# Patient Record
Sex: Male | Born: 1940
Health system: Southern US, Community
[De-identification: ages and names within clinical notes are randomized; demographics above are authoritative.]

## PROBLEM LIST (undated history)

## (undated) DIAGNOSIS — E119 Type 2 diabetes mellitus without complications: Secondary | ICD-10-CM

## (undated) DIAGNOSIS — M1990 Unspecified osteoarthritis, unspecified site: Secondary | ICD-10-CM

## (undated) DIAGNOSIS — N289 Disorder of kidney and ureter, unspecified: Secondary | ICD-10-CM

## (undated) DIAGNOSIS — I639 Cerebral infarction, unspecified: Secondary | ICD-10-CM

## (undated) DIAGNOSIS — I10 Essential (primary) hypertension: Secondary | ICD-10-CM

## (undated) HISTORY — DX: Cerebral infarction, unspecified: I63.9

## (undated) HISTORY — DX: Unspecified osteoarthritis, unspecified site: M19.90

## (undated) HISTORY — PX: NASAL SINUS SURGERY: SHX719

## (undated) HISTORY — DX: Essential (primary) hypertension: I10

## (undated) HISTORY — DX: Type 2 diabetes mellitus without complications: E11.9

## (undated) HISTORY — PX: TOTAL HIP ARTHROPLASTY: SHX124

## (undated) HISTORY — DX: Disorder of kidney and ureter, unspecified: N28.9

---

## 2003-09-07 ENCOUNTER — Encounter: Payer: Self-pay | Admitting: Orthopedic Surgery

## 2003-09-11 ENCOUNTER — Inpatient Hospital Stay (HOSPITAL_COMMUNITY): Admission: RE | Admit: 2003-09-11 | Discharge: 2003-09-15 | Payer: Self-pay | Admitting: Orthopedic Surgery

## 2003-09-11 ENCOUNTER — Encounter: Payer: Self-pay | Admitting: Orthopedic Surgery

## 2013-11-10 ENCOUNTER — Ambulatory Visit (INDEPENDENT_AMBULATORY_CARE_PROVIDER_SITE_OTHER): Payer: Medicare Other | Admitting: Internal Medicine

## 2013-11-10 ENCOUNTER — Encounter: Payer: Self-pay | Admitting: Internal Medicine

## 2013-11-10 VITALS — BP 140/80 | HR 89 | Ht 63.0 in | Wt 168.4 lb

## 2013-11-10 DIAGNOSIS — R05 Cough: Secondary | ICD-10-CM | POA: Insufficient documentation

## 2013-11-10 NOTE — Assessment & Plan Note (Signed)
Am concerned you might or might not have scarring disease in lung to explain your symptoms  - Have CT scan chest wo contrast at [facility]  . High Resolution CT chest without contrast on ILD protocol. Only  Dr Leanna Battles or Dr. Trudie Reed to read  - Have PFT any location in [place]  - Have overnight oxygen study  #FOllowup   - return to see me or my NP after the above  - If CT shows definite scarring we need to do autoimmune blood work but we can decide this at followup

## 2013-11-10 NOTE — Patient Instructions (Signed)
Am concerned you might or might not have scarring disease in lung to explain your symptoms  - Have CT scan chest wo contrast at [facility]  . High Resolution CT chest without contrast on ILD protocol. Only  Dr Leanna Battles or Dr. Trudie Reed to read  - Have PFT any location in [place]  - Have overnight oxygen study  #FOllowup   - return to see me or my NP after the above  - If CT shows definite scarring we need to do some blood work but we can decide this at followup

## 2013-11-10 NOTE — Progress Notes (Signed)
Subjective:    Patient ID: Matthew Harrell, male    DOB: 12/18/1941, 72 y.o.   MRN: [id number] PCP TAPPER,DAVID B, MD    HPI  Referred for chronic cough  72 year old obese male reports insidious onset of tonic cough for the last 2 years. At onset he recollects a sinus infection but after that he's had a dry cough. A year ago due to nasal polyps had "extensive" sinus surgery in [place], [place] but this did not help his cough. He thinks the cough is progressive. Moderate in severity. Worse in the nighttime and when he lies down. Improved in the daytime. No other clear cut aggravating or relieving factors. There is associated ticklish sensation in the throat, postnasal drainage, acid reflux which he thinks is well controlled. He also periodically clears his throat  Cough is associated with dyspnea for the last 1 year. Insidious onset. Exertional.. Relieved with rest. Mild in intensity. No associated chest pain. Cough and dyspnea associated with crackles on chest exam  Walking desaturation test today 108 5 feet x3 laps: Lowest pulse ox was 92%. Peak heart rate was 117 per minute   Cough related Hx - BP  - on ARB for a year   - Sinus  / s/p sinus surgery and removal of polyps a year ago in [place], [place] but no relief in cough  Current Rx with zyrtec and singulair  - GI  /hx positive for GERD; on omprazole  - Exposure hx  -   reports that he quit smoking about 30 years ago. His smoking use included Cigarettes. He has a 10 pack-year smoking history. He does not have any smokeless tobacco history on file.   - Worked in the [employer]. 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  TWO VIEW CHEST - 09/07/03  COMPARISON: NONE.  THE CARDIOMEDIASTINAL SILHOUETTE IS UNREMARKABLE FOR AGE. THERE IS LINEAR SCAR OR ATELECTASIS IN  THE LEFT LOWER LOBE. THE LUNGS APPEAR CLEAR OTHERWISE. THERE ARE DEGENERATIVE CHANGES THROUGHOUT  THE THORACIC SPINE, AND THERE ARE WEDGE COMPRESSION DEFORMITIES OF T11 AND T12.  IMPRESSION  LINEAR ATELECTASIS VS. SCAR IN THE LEFT LOWER LOBE. NO EVIDENCE OF ACUTE DISEASE OTHERWISE. WEDGE  COMPRESSION DEFORMITIES OF T11 AND T12.   Past Medical History  Diagnosis Date  . Diabetes   . Arthritis   . Hypertension      Family History  Problem Relation Age of Onset  . Diabetes Brother   . Stomach cancer Sister   . Aneurysm Mother 67  . Heart attack Father 73     History   Social History  . Marital Status: Divorced    Spouse Name: N/A    Number of Children: N/A  . Years of Education: N/A   Occupational History  . Not on file.   Social History Main Topics  . Smoking status: Former Smoker -- 1.00 packs/day for 10 years    Types: Cigarettes    Quit date: 12/29/1982  . Smokeless tobacco: Not on file  . Alcohol Use: No  . Drug Use: No  . Sexual Activity: Not on file   Other Topics Concern  . Not on file  Social History Narrative  . No narrative on file     No Known Allergies   No outpatient prescriptions prior to visit.   No facility-administered medications prior to visit.       Review of Systems  Constitutional: Negative for fever and unexpected weight change.  HENT: Negative for congestion, dental problem, ear pain, nosebleeds, postnasal drip, rhinorrhea, sinus pressure, sneezing, sore throat and trouble swallowing.   Eyes: Negative for redness and itching.  Respiratory: Positive for cough, chest tightness, shortness of breath and wheezing.   Cardiovascular: Negative for palpitations and leg swelling.  Gastrointestinal: Negative for nausea and vomiting.  Genitourinary: Negative  for dysuria.  Musculoskeletal: Negative for joint swelling.  Skin: Negative for rash.  Neurological: Negative for headaches.  Hematological: Does not bruise/bleed easily.  Psychiatric/Behavioral: Negative for dysphoric mood. The patient is not nervous/anxious.        Objective:   Physical Exam  Nursing note and vitals reviewed. Constitutional: He is oriented to person, place, and time. He appears well-developed and well-nourished. No distress.  Short and obese  HENT:  Head: Normocephalic and atraumatic.  Right Ear: External ear normal.  Left Ear: External ear normal.  Mouth/Throat: Oropharynx is clear and moist. No oropharyngeal exudate.  Postnasal drainage present  Eyes: Conjunctivae and EOM are normal. Pupils are equal, round, and reactive to light. Right eye exhibits no discharge. Left eye exhibits no discharge. No scleral icterus.  Neck: Normal range of motion. Neck supple. No JVD present. No tracheal deviation present. No thyromegaly present.  Cardiovascular: Normal rate, regular rhythm and intact distal pulses.  Exam reveals no gallop and no friction rub.   No murmur heard. Pulmonary/Chest: Effort normal. No respiratory distress. He has no wheezes. He has rales. He exhibits no tenderness.  Abdominal: Soft. Bowel sounds are normal. He exhibits no distension and no mass. There is no tenderness. There is no rebound and no guarding.  Musculoskeletal: Normal range of motion. He exhibits no edema and no tenderness.  No clubbing  Lymphadenopathy:    He has no cervical adenopathy.  Neurological: He is alert and oriented to person, place, and time. He has normal reflexes. No cranial nerve deficit. Coordination normal.  Skin: Skin is warm and dry. No rash noted. He is not diaphoretic. No erythema. No pallor.  Psychiatric: He has a normal mood and affect. His behavior is normal. Judgment and thought content normal.          Assessment & Plan:

## 2013-11-17 ENCOUNTER — Telehealth: Payer: Self-pay | Admitting: Internal Medicine

## 2013-11-17 ENCOUNTER — Other Ambulatory Visit: Payer: Self-pay | Admitting: Internal Medicine

## 2013-11-17 ENCOUNTER — Ambulatory Visit (INDEPENDENT_AMBULATORY_CARE_PROVIDER_SITE_OTHER)
Admission: RE | Admit: 2013-11-17 | Discharge: 2013-11-17 | Disposition: A | Discharge status: Home / Self Care | Payer: Medicare Other | Source: Ambulatory Visit | Attending: Internal Medicine | Admitting: Internal Medicine

## 2013-11-17 DIAGNOSIS — R05 Cough: Secondary | ICD-10-CM

## 2013-11-17 DIAGNOSIS — I2510 Atherosclerotic heart disease of native coronary artery without angina pectoris: Secondary | ICD-10-CM

## 2013-11-17 DIAGNOSIS — R059 Cough, unspecified: Secondary | ICD-10-CM

## 2013-11-17 NOTE — Telephone Encounter (Signed)
CT chest shows  - possible aspiration - ask him if he is choking on food - calcium deppists on heart blood vessel - if he has not had stress test in few years or does not have cardioloigst - please refer him to one - ensure fu with TP after PFT and ONO  Dr. Kalman Shan, M.D., [facility].C.P Pulmonary and Critical Care Medicine Staff Physician [facility] Pager: [phone], If no answer or between  15:00h - 7:00h: call [phone]  11/17/2013 5:59 PM       Ct Chest High Resolution  11/17/2013   CLINICAL DATA:  Shortness of breath with exertion. Dry cough. Evaluate for interstitial lung disease.  EXAM: CHEST CT WITHOUT CONTRAST  TECHNIQUE: Multidetector CT imaging of the chest was performed following the standard protocol without intravenous contrast. High resolution imaging of the lungs, as well as inspiratory and expiratory imaging, was performed.  COMPARISON:  No priors.  FINDINGS: Mediastinum: Heart size is normal. There is no significant pericardial fluid, thickening or pericardial calcification. There is atherosclerosis of the thoracic aorta, the great vessels of the mediastinum and the coronary arteries, including calcified atherosclerotic plaque in the left main, left anterior descending and right coronary arteries. Multiple prominent borderline enlarged mediastinal lymph nodes are nonspecific. No definite pathologically enlarged mediastinal or hilar lymph nodes. Please note that accurate exclusion of hilar adenopathy is limited on noncontrast CT scans. Esophagus is unremarkable in appearance.  Lungs/Pleura: Extensive bronchial wall thickening is noted throughout the lung bases bilaterally, most pronounced in the dependent portions of the lower lobes of the lungs bilaterally where there is also some mild cylindrical bronchiectasis and thickening of the peribronchovascular interstitium, as well as some surrounding peribronchovascular  ground-glass attenuation and micronodularity. In the posterior aspect of the left lower lobe in particular, there is also some architectural distortion. High-resolution images demonstrate no areas of significant subpleural reticulation, parenchymal banding or definite honeycombing to suggest the presence of an interstitial lung disease at this time. No definite suspicious appearing pulmonary nodules or masses are identified. No pleural effusions.  Upper Abdomen: Unremarkable.  Musculoskeletal: There are no aggressive appearing lytic or blastic lesions noted in the visualized portions of the skeleton.  IMPRESSION: 1. The appearance of the lower lobes of the lungs is most suggestive of recurrent aspiration. Findings in the left lower lobe in particular may suggest a mild aspiration pneumonitis at this time. Clinical correlation is recommended. 2. No other findings to suggest interstitial lung disease at this time. 3. Atherosclerosis, including left main and 2 vessel coronary artery disease. Assessment for potential risk factor modification, dietary therapy or pharmacologic therapy may be warranted, if clinically indicated.   Electronically Signed   By: Trudie Reed M.D.   On: 11/17/2013 15:40

## 2013-11-18 NOTE — Telephone Encounter (Signed)
[id number].Laci Frenkel, CMA  

## 2013-11-20 ENCOUNTER — Telehealth: Payer: Self-pay | Admitting: Internal Medicine

## 2013-11-20 NOTE — Telephone Encounter (Signed)
CT shows aspiration that can explain cough. Ensure fu with me or NP Tammy after PFt; right now I see none  Also, ct chest shows coronary artery calcification; refer cardiology   Dr. Kalman Shan, M.D., [facility].C.P Pulmonary and Critical Care Medicine Staff Physician [facility] Pager: [phone], If no answer or between  15:00h - 7:00h: call [phone]  11/20/2013 5:06 PM

## 2013-11-21 NOTE — Telephone Encounter (Signed)
Returning call can be reached at [phone].Matthew Harrell

## 2013-11-21 NOTE — Telephone Encounter (Signed)
Pt advised. He does not have cardiologists so order placed. He states that he has not noticed any increase in cough after eating or drinking but is not sure about this. Appt set to see TP after his pft and [place]ell Spar. Carron Curie, CMA

## 2013-11-21 NOTE — Telephone Encounter (Signed)
Pt advised via the first phone note sent on this topic, see phone note. Carron Curie, CMA

## 2013-11-22 ENCOUNTER — Other Ambulatory Visit (HOSPITAL_COMMUNITY): Payer: Self-pay | Admitting: Cardiology

## 2013-11-22 ENCOUNTER — Encounter: Payer: Self-pay | Admitting: Cardiology

## 2013-11-22 ENCOUNTER — Ambulatory Visit (INDEPENDENT_AMBULATORY_CARE_PROVIDER_SITE_OTHER): Payer: Medicare Other | Admitting: Cardiology

## 2013-11-22 VITALS — BP 160/90 | HR 84 | Ht 63.0 in | Wt 171.4 lb

## 2013-11-22 DIAGNOSIS — I7090 Unspecified atherosclerosis: Secondary | ICD-10-CM

## 2013-11-22 DIAGNOSIS — R9389 Abnormal findings on diagnostic imaging of other specified body structures: Secondary | ICD-10-CM

## 2013-11-22 NOTE — Patient Instructions (Addendum)
The current medical regimen is effective;  continue present plan and medications.  Your physician has requested that you have an exercise tolerance test at [facility] For further information please visit [email or web address] Please also follow instruction sheet, as given.  Further follow up will be based on these results.

## 2013-11-22 NOTE — Progress Notes (Signed)
HPI The patient presents for evaluation of coronary calcification. He has no prior cardiac history. However, he recently was being evaluated for cough. He was found to have coronary calcification involving the left main and LAD. He was referred for further evaluation of this. He does have significant cardiovascular risk factors. He is an active gentleman but doesn't exercise routinely. He does have some hip pain. However, with his activities of daily living he denies any chest pressure, neck or arm discomfort. He does not have any shortness of breath, PND or orthopnea. She denies any palpitations, presyncope or syncope. He does have a dry hacking cough which he has had for couple of years. The only time to get short of breath as if he exerts himself significantly such as carrying heavy packages.  No Known Allergies  Current Outpatient Prescriptions  Medication Sig Dispense Refill  . aspirin 81 MG tablet Take 81 mg by mouth daily.      . cetirizine (ZYRTEC) 10 MG tablet Take 10 mg by mouth daily.      Marland Kitchen glimepiride (AMARYL) 4 MG tablet Take 1 tablet by mouth 2 (two) times daily.      Marland Kitchen losartan (COZAAR) 100 MG tablet Take 1 tablet by mouth daily.      . metFORMIN (GLUCOPHAGE) 500 MG tablet Take 500 mg by mouth 2 (two) times daily with a meal.      . montelukast (SINGULAIR) 10 MG tablet Take 1 tablet by mouth daily.      Marland Kitchen omeprazole (PRILOSEC) 20 MG capsule Take 1 capsule by mouth daily.      . simvastatin (ZOCOR) 40 MG tablet Take 1 tablet by mouth daily.      . traMADol (ULTRAM) 50 MG tablet Take 1 tablet by mouth as needed.       No current facility-administered medications for this visit.    Past Medical History  Diagnosis Date  . Diabetes   . Arthritis   . Hypertension     Past Surgical History  Procedure Laterality Date  . Total hip arthroplasty    . Nasal sinus surgery      Family History  Problem Relation Age of Onset  . Diabetes Brother   . Stomach cancer Sister   .  Aneurysm Mother 46  . Heart attack Father 3    History   Social History  . Marital Status: Divorced    Spouse Name: N/A    Number of Children: N/A  . Years of Education: N/A   Occupational History  . Not on file.   Social History Main Topics  . Smoking status: Former Smoker -- 1.00 packs/day for 10 years    Types: Cigarettes    Quit date: 12/29/1982  . Smokeless tobacco: Not on file  . Alcohol Use: No  . Drug Use: No  . Sexual Activity: Not on file   Other Topics Concern  . Not on file   Social History Narrative  . No narrative on file    ROS:   Positive for reflux, urinary frequency, leg cramps, joint paint. Otherwise as stated in the HPI and negative for all other systems.  PHYSICAL EXAM BP 160/90  Pulse 84  Ht 5\' 3"  (1.6 m)  Wt 171 lb 6.4 oz (77.747 kg)  BMI 30.37 kg/m2 GENERAL:  Well appearing HEENT:  Pupils equal round and reactive, fundi not visualized, oral mucosa unremarkable NECK:  No jugular venous distention, waveform within normal limits, carotid upstroke brisk and symmetric, no bruits,  no thyromegaly LYMPHATICS:  No cervical, inguinal adenopathy LUNGS:  Clear to auscultation bilaterally BACK:  No CVA tenderness CHEST:  Unremarkable HEART:  PMI not displaced or sustained,S1 and S2 within normal limits, no S3, no S4, no clicks, no rubs, no murmurs ABD:  Flat, positive bowel sounds normal in frequency in pitch, no bruits, no rebound, no guarding, no midline pulsatile mass, no hepatomegaly, no splenomegaly EXT:  2 plus pulses throughout, no edema, no cyanosis no clubbing SKIN:  No rashes no nodules NEURO:  Cranial nerves II through XII grossly intact, motor grossly intact throughout PSYCH:  Cognitively intact, oriented to person place and time   EKG: Sinus rhythm, rate 84, axis within normal limits, intervals within normal limits, no acute ST-T wave changes.   ASSESSMENT AND PLAN  CORONARY CALCIUM:  He does have some plaque obviously has coronary  have no suspicion that he is obstructive. He should be screened with an exercise treadmill test and he thinks he would be able to do this. I will bring the patient back for a POET (Plain Old Exercise Test). This will allow me to screen for obstructive coronary disease, risk stratify and very importantly provide a prescription for exercise.  HTN:  The blood pressure is elevated. However, he says that he is typically well controlled. I discussed possibly getting a blood pressure  Monitor and keeping a diary.

## 2013-11-23 ENCOUNTER — Encounter: Payer: Self-pay | Admitting: Cardiology

## 2013-11-27 DIAGNOSIS — R9389 Abnormal findings on diagnostic imaging of other specified body structures: Secondary | ICD-10-CM | POA: Insufficient documentation

## 2013-11-29 ENCOUNTER — Telehealth: Payer: Self-pay | Admitting: Cardiology

## 2013-11-29 NOTE — Telephone Encounter (Signed)
Sent a message to [facility] to schedule.  Will follow up.

## 2013-11-29 NOTE — Telephone Encounter (Signed)
New message    Saw Dr Antoine Poche a few weeks and someone was supposed to set him up a stress test appt at [facility].  Has it been set up?

## 2013-11-29 NOTE — Telephone Encounter (Signed)
Scheduled for 12/8 pt is aware.

## 2013-12-05 ENCOUNTER — Encounter: Payer: Self-pay | Admitting: Cardiology

## 2013-12-05 ENCOUNTER — Ambulatory Visit (HOSPITAL_COMMUNITY)
Admission: RE | Admit: 2013-12-05 | Discharge: 2013-12-05 | Disposition: A | Discharge status: Home / Self Care | Payer: Medicare Other | Source: Ambulatory Visit | Attending: Cardiology | Admitting: Cardiology

## 2013-12-05 DIAGNOSIS — I7090 Unspecified atherosclerosis: Secondary | ICD-10-CM

## 2013-12-05 DIAGNOSIS — I2510 Atherosclerotic heart disease of native coronary artery without angina pectoris: Secondary | ICD-10-CM | POA: Insufficient documentation

## 2013-12-05 NOTE — Progress Notes (Signed)
Stress Lab Nurses Notes - Matthew Harrell 12/05/2013 Reason for doing test: Atherosclerosis Type of test: Regular GTX Nurse performing test: Parke Poisson, RN Nuclear Medicine Tech: Not Applicable Echo Tech: Not Applicable MD performing test: S. McDowell/K.Lawrence NP Family MD: Dr. Margo Common Test explained and consent signed: yes IV started: No IV started Symptoms: None Treatment/Intervention: None Reason test stopped: fatigue After recovery IV was: NA Patient to return to Nuc. Med at : NA Patient discharged: Home Patient's Condition upon discharge was: stable Comments: During test peak BP 194/71 & HR151 .  Recovery BP 179/81 & HR 90 .  Symptoms resolved in recovery [name] T

## 2013-12-05 NOTE — Progress Notes (Signed)
Stress Lab Nurses Notes - Jerimy Johanson Tabron  12/05/2013  Reason for doing test: Atherosclerosis by CT imaging Type of test: Regular GTX  Nurse performing test: Parke Poisson, RN  MD performing test: S. Goebel Hellums/K.Lawrence NP  Family MD: Dr. Margo Common  Test explained and consent signed: Yes  IV started: No IV started  Symptoms: None  Treatment/Intervention: None  Reason test stopped: Fatigue and adequate HR response After recovery IV was: NA  Patient discharged: Home  Patient's Condition upon discharge was: Stable  Comments: During test peak BP 194/71 & HR151 . Recovery BP 179/81 & HR 90 . Symptoms resolved in recovery  [name] T  Attending note:  Patient exercised on a Bruce protocol for 6:13 achieving workload 7.6 METS. Peak heart rate 151 BPM, 102% MPHR. Peak blood pressure 207/82. No chest pain reported. No diagnostic ST segment abnormalities or arrhythmias. Hypertensive response. Negative for ischemia.  Jonelle Sidle, M.D., F.A.C.C.

## 2013-12-08 NOTE — Progress Notes (Signed)
I would like for him to get a blood pressure monitor to keep track of his BP at home.  Call Matthew Harrell with the results and send results to TAPPER,DAVID B, MD

## 2013-12-09 ENCOUNTER — Ambulatory Visit (INDEPENDENT_AMBULATORY_CARE_PROVIDER_SITE_OTHER): Payer: Medicare Other | Admitting: Internal Medicine

## 2013-12-09 ENCOUNTER — Telehealth: Payer: Self-pay | Admitting: *Deleted

## 2013-12-09 DIAGNOSIS — R05 Cough: Secondary | ICD-10-CM

## 2013-12-09 LAB — PULMONARY FUNCTION TEST
DL/VA % pred: 125 %
DL/VA: 5.01 ml/min/mmHg/L
DLCO unc % pred: 90 %
DLCO unc: 19.49 ml/min/mmHg
FEF 25-75 Post: 2.11 L/sec
FEF 25-75 Pre: 2.16 L/sec
FEF2575-%Change-Post: -2 %
FEF2575-%Pred-Post: 126 %
FEF2575-%Pred-Pre: 130 %
FEV1-%Pred-Pre: 109 %
FEV1-Post: 2.46 L
FEV1-Pre: 2.43 L
FEV1FVC-%Change-Post: 2 %
FEV6-%Change-Post: 0 %
FEV6-%Pred-Post: 107 %
FEV6-%Pred-Pre: 107 %
FEV6-Pre: 3.05 L
FEV6FVC-%Pred-Post: 107 %
FVC-%Change-Post: 0 %
FVC-%Pred-Post: 99 %
FVC-%Pred-Pre: 100 %
FVC-Post: 3.05 L
FVC-Pre: 3.08 L
Post FEV1/FVC ratio: 81 %
Post FEV6/FVC ratio: 100 %
Pre FEV1/FVC ratio: 79 %
RV % pred: 106 %
TLC % pred: 102 %

## 2013-12-09 NOTE — Telephone Encounter (Signed)
Left message with pts sister for pt to obtain a BP monitor and keep a check out it at home.  She states understanding and will let him know when he returns home.  Asked her to have him call with any questions.

## 2013-12-09 NOTE — Progress Notes (Signed)
PFT done today. 

## 2013-12-09 NOTE — Telephone Encounter (Signed)
Message copied by Sharin Grave on Fri Dec 09, 2013  2:55 PM ------      Message from: Rollene Rotunda      Created: Thu Dec 08, 2013  8:41 PM                   ----- Message -----         From: Jonelle Sidle, MD         Sent: 12/05/2013   2:41 PM           To: Rollene Rotunda, MD             ------

## 2013-12-12 ENCOUNTER — Ambulatory Visit (INDEPENDENT_AMBULATORY_CARE_PROVIDER_SITE_OTHER): Payer: Medicare Other | Admitting: Adult Health

## 2013-12-12 ENCOUNTER — Encounter: Payer: Self-pay | Admitting: Adult Health

## 2013-12-12 VITALS — BP 150/74 | HR 82 | Temp 97.0°F | Ht 63.0 in | Wt 168.8 lb

## 2013-12-12 DIAGNOSIS — R0902 Hypoxemia: Secondary | ICD-10-CM

## 2013-12-12 DIAGNOSIS — R05 Cough: Secondary | ICD-10-CM

## 2013-12-12 DIAGNOSIS — G4734 Idiopathic sleep related nonobstructive alveolar hypoventilation: Secondary | ICD-10-CM

## 2013-12-12 NOTE — Progress Notes (Signed)
Subjective:    Patient ID: Matthew Harrell, male    DOB: [birth year], 72 y.o.   MRN: [id number] PCP TAPPER,DAVID B, MD    HPI  Referred for chronic cough  72 year old obese male reports insidious onset of tonic cough for the last 2 years. At onset he recollects a sinus infection but after that he's had a dry cough. A year ago due to nasal polyps had "extensive" sinus surgery in [place], [place] but this did not help his cough. He thinks the cough is progressive. Moderate in severity. Worse in the nighttime and when he lies down. Improved in the daytime. No other clear cut aggravating or relieving factors. There is associated ticklish sensation in the throat, postnasal drainage, acid reflux which he thinks is well controlled. He also periodically clears his throat  Cough is associated with dyspnea for the last 1 year. Insidious onset. Exertional.. Relieved with rest. Mild in intensity. No associated chest pain. Cough and dyspnea associated with crackles on chest exam  Walking desaturation test today 108 5 feet x3 laps: Lowest pulse ox was 92%. Peak heart rate was 117 per minute   Cough related Hx - BP  - on ARB for a year   - Sinus  / s/p sinus surgery and removal of polyps a year ago in [place], [place] but no relief in cough  Current Rx with zyrtec and singulair  - GI  /hx positive for GERD; on omprazole  - Exposure hx  -   reports that he quit smoking about 30 years ago. His smoking use included Cigarettes. He has a 10 pack-year smoking history. He does not have any smokeless tobacco history on file.   - Worked in the [employer]. 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  TWO VIEW CHEST - 09/07/03  COMPARISON: NONE.  THE CARDIOMEDIASTINAL SILHOUETTE IS UNREMARKABLE FOR AGE. THERE IS LINEAR SCAR OR ATELECTASIS IN  THE LEFT LOWER LOBE. THE LUNGS APPEAR CLEAR OTHERWISE. THERE ARE DEGENERATIVE CHANGES THROUGHOUT  THE THORACIC SPINE, AND THERE ARE WEDGE COMPRESSION DEFORMITIES OF T11 AND T12.  IMPRESSION  LINEAR ATELECTASIS VS. SCAR IN THE LEFT LOWER LOBE. NO EVIDENCE OF ACUTE DISEASE OTHERWISE. WEDGE  COMPRESSION DEFORMITIES OF T11 AND T12.  . 12/12/2013 Follow up  Patient returns for followup visit. Patient was seen one month ago for chronic cough. Patient underwent a pulmonary function test on December 12 that showed no airflow obstruction. FEV1 was 109%, ratio 79, no change with bronchodilator. Diffusing capacity was normal at 90%. FVC was 100% Patient underwent a overnight oximetry test. That did show  Desaturations while sleeping. We discussed starting him on nocturnal oxygen, which he was agreeable to. Patient does have daytime sleepiness and snoring. Talked about the possible diagnosis of sleep apnea. Patient complains that he continues to have a dry cough, but is very aggravating. Patient does have occasional reflux. Cough is worse at night. Patient had a CT chest that showed no evidence of interstitial lung disease. There is extensive bronchial wall thickening along the lung bases, where there was some mild bronchiectasis. Question whether this may be mild aspiration pneumonitis changes .  Patient's family is very  concerned that his Cozaar is causing his cough. We discussed that this would unusual for Cozaar. Patient denies any hemoptysis, orthopnea, PND, or leg swelling.   Review of Systems  Constitutional:   No  weight loss, night sweats,  Fevers, chills,  +fatigue, or  lassitude.  HEENT:   No headaches,  Difficulty swallowing,  Tooth/dental problems, or  Sore throat,                No sneezing, itching, ear ache,  nasal congestion, post nasal drip,   CV:  No chest pain,  Orthopnea, PND, swelling in lower extremities, anasarca, dizziness, palpitations, syncope.   GI  No heartburn, indigestion, abdominal pain, nausea, vomiting, diarrhea, change in bowel habits, loss of appetite, bloody stools.   Resp:   No chest wall deformity  Skin: no rash or lesions.  GU: no dysuria, change in color of urine, no urgency or frequency.  No flank pain, no hematuria   MS:  No joint pain or swelling.  No decreased range of motion.  No back pain.  Psych:  No change in mood or affect. No depression or anxiety.  No memory loss.          Objective:   Physical Exam  GEN: A/Ox3; pleasant , NAD eldelry   HEENT:  [place]/AT,  EACs-clear, TMs-wnl, NOSE-clear, THROAT-clear, no lesions, no postnasal drip or exudate noted.   NECK:  Supple w/ fair ROM; no JVD; normal carotid impulses w/o bruits; no thyromegaly or nodules palpated; no lymphadenopathy.  RESP  Clear  P & A; w/o, wheezes/ rales/ or rhonchi.no accessory muscle use, no dullness to percussion  CARD:  RRR, no m/r/g  , no peripheral edema, pulses intact, no cyanosis or clubbing.  GI:   Soft & nt; nml bowel sounds; no organomegaly or masses detected.  Musco: Warm bil, no deformities or joint swelling noted.   Neuro: alert, no focal deficits noted.    Skin: Warm, no lesions or rashes

## 2013-12-12 NOTE — Patient Instructions (Signed)
We are setting you up for sleep study -split night .  We are setting you up for Oxygen At bedtime  2 l/m  Begin Delsym 2 tsp Twice daily   Continue on GERD diet  No eating 3 hrs before bedtime  Continue on Omeprazole 20mg  daily before meal .  Add Pepcid 20mg  bedtime.  Can discuss with family doctor regarding changing your Cozaar.  Please contact office for sooner follow up if symptoms do not improve or worsen or seek emergency care  Follow up Dr. Marchelle Gearing in 6 weeks and As needed

## 2013-12-14 DIAGNOSIS — G4734 Idiopathic sleep related nonobstructive alveolar hypoventilation: Secondary | ICD-10-CM | POA: Insufficient documentation

## 2013-12-14 NOTE — Assessment & Plan Note (Signed)
?  Upper airway irritablility from GERD  PFT with no sign of airflow obstruction  Doubt cozaar however could change to diovan to see if this helped.   Plan Begin Delsym 2 tsp Twice daily   Continue on GERD diet  No eating 3 hrs before bedtime  Continue on Omeprazole 20mg  daily before meal .  Add Pepcid 20mg  bedtime.  Can discuss with family doctor regarding changing your Cozaar.  Please contact office for sooner follow up if symptoms do not improve or worsen or seek emergency care  Follow up Dr. Marchelle Gearing in 6 weeks and As needed

## 2013-12-14 NOTE — Assessment & Plan Note (Addendum)
ONO +desats  ? OSA component   Plan  We are setting you up for sleep study -split night .  We are setting you up for Oxygen At bedtime  2 l/m  Please contact office for sooner follow up if symptoms do not improve or worsen or seek emergency care  Follow up Dr. Marchelle Gearing in 6 weeks and As needed

## 2013-12-26 ENCOUNTER — Telehealth: Payer: Self-pay | Admitting: Internal Medicine

## 2013-12-26 DIAGNOSIS — R05 Cough: Secondary | ICD-10-CM

## 2013-12-26 DIAGNOSIS — R9389 Abnormal findings on diagnostic imaging of other specified body structures: Secondary | ICD-10-CM

## 2013-12-26 DIAGNOSIS — G4734 Idiopathic sleep related nonobstructive alveolar hypoventilation: Secondary | ICD-10-CM

## 2013-12-26 NOTE — Telephone Encounter (Signed)
I spoke with [facility] and was advised that the diagnosis cough does not work with insurance for nocturnal oxygen. I advised that the pt had nocturnal hypoxemia and was advised that this usually does not work either. I advised that the pt is being evaluated for OSA and a sleep study has been ordered. I was advised to send an order with the nocturnal hypoxemia diagnosis and they will see if this works, if not then they will need to wait for sleep study results. Order has been placed. I have LMTCB to advise the pt daughter of this. Carron Curie, CMA

## 2013-12-26 NOTE — Telephone Encounter (Signed)
Pt's daughter returned call. Holly D Pryor ° °

## 2013-12-26 NOTE — Telephone Encounter (Signed)
I called and spoke with daughter and is aware of this. Nothing further needed

## 2014-01-15 ENCOUNTER — Ambulatory Visit (HOSPITAL_BASED_OUTPATIENT_CLINIC_OR_DEPARTMENT_OTHER): Payer: Medicare Other | Attending: Adult Health

## 2014-01-15 VITALS — Ht 63.0 in | Wt 160.0 lb

## 2014-01-15 DIAGNOSIS — G4733 Obstructive sleep apnea (adult) (pediatric): Secondary | ICD-10-CM | POA: Insufficient documentation

## 2014-01-15 DIAGNOSIS — R05 Cough: Secondary | ICD-10-CM

## 2014-01-15 DIAGNOSIS — R059 Cough, unspecified: Secondary | ICD-10-CM

## 2014-01-15 DIAGNOSIS — Z9989 Dependence on other enabling machines and devices: Secondary | ICD-10-CM

## 2014-01-23 ENCOUNTER — Ambulatory Visit (INDEPENDENT_AMBULATORY_CARE_PROVIDER_SITE_OTHER): Payer: Medicare Other | Admitting: Internal Medicine

## 2014-01-23 ENCOUNTER — Telehealth: Payer: Self-pay | Admitting: Internal Medicine

## 2014-01-23 ENCOUNTER — Encounter: Payer: Self-pay | Admitting: Internal Medicine

## 2014-01-23 VITALS — BP 140/76 | HR 96 | Wt 176.7 lb

## 2014-01-23 DIAGNOSIS — R059 Cough, unspecified: Secondary | ICD-10-CM

## 2014-01-23 DIAGNOSIS — R05 Cough: Secondary | ICD-10-CM

## 2014-01-23 DIAGNOSIS — G4734 Idiopathic sleep related nonobstructive alveolar hypoventilation: Secondary | ICD-10-CM

## 2014-01-23 DIAGNOSIS — G4733 Obstructive sleep apnea (adult) (pediatric): Secondary | ICD-10-CM

## 2014-01-23 DIAGNOSIS — R0902 Hypoxemia: Secondary | ICD-10-CM

## 2014-01-23 MED ORDER — GABAPENTIN 300 MG PO CAPS: ORAL_CAPSULE | ORAL | Status: DC

## 2014-01-23 MED ORDER — BECLOMETHASONE DIPROPIONATE 80 MCG/ACT IN AERS: 2.0000 | INHALATION_SPRAY | Freq: Two times a day (BID) | RESPIRATORY_TRACT | Status: DC

## 2014-01-23 NOTE — Patient Instructions (Signed)
#  Oxygenation issues  - await sleep study 01/15/14; If you donot hear from us by end of week call us  #Chronic cough Cough is from sinus drainage, acid reflux, possilbe asthma,  All of this is working together to cause cyclical cough/LPR cough  #Sinus drainage  - continue prior measures  #Possible Acid Reflux  - coontinue otc omeprazole  20mg   1 capsule daily on empty stomach (nurse will send script)   -  avoid colas, spices, cheeses, spirits, red meats, beer, chocolates, fried foods etc.,   - sleep with head end of bed elevated  - eat small frequent meals  - do not go to bed for 3 hours after last meal  #Possible Asthma  - start QVAR 80mcg 2 puff twice daily - take samples and script  and show inhaler technique  #Cyclical cough  - please choose 2-3 days and observe complete voice rest - no talking or whispering  - at all times there  there is urge to cough, drink water or swallow or sip on throat lozenge - refer neuro rehab MR Schinke  - Take gabapentin 300mg  once daily x 3 days, then 300mg  twice daily x 3 days, then 300mg  three times daily to continue. If this makes you too sleepy or drowsy call us and we will cut your medication dosing down   #Followup - I will see you in 4-6 weeks.  - any problems call or come sooner

## 2014-01-23 NOTE — Progress Notes (Signed)
Subjective:    Patient ID: Matthew Harrell, male    DOB: 1941-03-27, 73 y.o.   MRN: [id number]  HPI   PCP TAPPER,DAVID B, MD    HPI OV 11/10/13  Referred for chronic cough  73 year old obese male reports insidious onset of chronic cough for the last 2 years. At onset he recollects a sinus infection but after that he's had a dry cough. A year ago due to nasal polyps had "extensive" sinus surgery in [place], [place] but this did not help his cough. He thinks the cough is progressive. Moderate in severity. Worse in the nighttime and when he lies down. Improved in the daytime. No other clear cut aggravating or relieving factors. There is associated ticklish sensation in the throat, postnasal drainage, acid reflux which he thinks is well controlled. He also periodically clears his throat  Cough is associated with dyspnea for the last 1 year. Insidious onset. Exertional.. Relieved with rest. Mild in intensity. No associated chest pain. Cough and dyspnea associated with crackles on chest exam  Walking desaturation test today 108 5 feet x3 laps: Lowest pulse ox was 92%. Peak heart rate was 117 per minute   Cough related Hx - BP  - on ARB for a year  - NEgative cardiac treadmill stress test 12/05/13 (co art calcification)   - Sinus  / s/p sinus surgery and removal of polyps a year ago in [place], [place] but no relief in cough  Current Rx with zyrtec and singulair  - GI  /hx positive for GERD; on omprazole  - Exposure hx  -   reports that he quit smoking about 30 years ago. His smoking use included Cigarettes. He has a 10 pack-year smoking history. He does not have any smokeless tobacco history on file.   - Worked in the [employer]. 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 SCAR IN THE LEFT LOWER LOBE. NO EVIDENCE OF ACUTE DISEASE OTHERWISE. WEDGE  COMPRESSION DEFORMITIES OF T11 AND T12.  REC Am concerned you might or might not have scarring disease in lung to explain your symptoms  - Have CT scan chest wo contrast at [facility]  . High Resolution CT chest without contrast on ILD protocol. Only  Dr Leanna Battles or Dr. Trudie Reed to read  - Have PFT any location in [place]  - Have overnight oxygen study  #FOllowup   - return to see me or my NP after the above  - If CT shows definite scarring we need to do some blood work but we can decide this at followup . 12/12/2013 Follow up  Patient returns for followup visit. Patient was seen one month ago for chronic cough. Patient underwent a pulmonary function test on December 12 that showed no airflow obstruction. FEV1 was 109%, ratio 79, no change with bronchodilator. Diffusing capacity was normal at 90%. FVC was 100%  Patient underwent a overnight oximetry test. That did show  Desaturations while sleeping. We discussed starting him on nocturnal oxygen, which he was agreeable to. Patient does have daytime sleepiness and snoring. Talked about the possible diagnosis of sleep apnea.  Patient complains that he continues to have a dry cough, but is very aggravating. Patient  does have occasional reflux. Cough is worse at night. Patient had a CT chest that showed no evidence of interstitial lung disease. There is extensive bronchial wall thickening along the lung bases, where there was some mild bronchiectasis. Question whether this may be mild aspiration pneumonitis changes .   Patient's family is very concerned that his Cozaar is causing his cough. We discussed that this would unusual for Cozaar. Patient denies any hemoptysis, orthopnea, PND, or leg swelling.  REC We are setting you up for sleep study -split night .  We are setting you  up for Oxygen At bedtime  2 l/m  Begin Delsym 2 tsp Twice daily   Continue on GERD diet  No eating 3 hrs before bedtime  Continue on Omeprazole 20mg  daily before meal .  Add Pepcid 20mg  bedtime.  Can discuss with family doctor regarding changing your Cozaar.  Please contact office for sooner follow up if symptoms do not improve or worsen or seek emergency care  Follow up Dr. Marchelle Gearingamaswamy in 6 weeks and As needed      OV 01/23/2014  Chief Complaint  Patient presents with  . Follow-up    Pt had sleep study 01/15/14. Per pt he has not been set up on O2 at bedtime. Cough is not any better, hoarse, wheezing, chest tx, occasional SOB.     Chronic COugh: still persistent. No better deespiote adding delsym and pepcid to PPI and sinus Rx. RSI cough score s 16 and suggests LPR cough. Kouffman diferentiator score clearly shows both GERD and neurogericn/airway relatd cough. HE currently denies dysphagia. CT shows ? RLL infiltrate ? Silent aspiration  Ovenright desat: had sleep study 01/15/14. Restult pending  Dr Gretta CoolKouffman Reflux Symptom Index (> 13-15 suggestive of LPR cough) 01/23/2014   Hoarseness or problem with voice 4  Clearing  Of Throat 3  Excess throat mucus or feeling or post nasal drip 0  Difficulty swallowing food, liquid or tablets 0  Cough after eating or lying down 4  Breathing difficulties or choking episodes 0  Troublesome or annoying cough 5  Sensation of something sticking in throat or lump in throat 0  Heartburn, chest pain, indigestion, or stomach acid coming up 0 with PPI  TOTAL 16      Kouffman Reflux v Neurogenic Cough Differentiator Reflux  Do you awaken from a sound sleep coughing violently?                            With trouble breathing? Yes  Do you have choking episodes when you cannot  Get enough air, gasping for air ?              No  Do you usually cough when you lie down into  The bed, or when you just lie down to rest ?                          Yes  Do you  usually cough after meals or eating?         Yes  Do you cough when (or after) you bend over?    sometimes  GERD SCORE  3.5  Kouffman Reflux v Neurogenic Cough Differentiator Neurogenic  Do you more-or-less cough all day long? no  Does change of temperature make you cough? yes  Does laughing or chuckling cause you to cough? yes  Do fumes (perfume, automobile fumes, burned  Toast, etc.,)  cause you to cough ?      no  Does speaking, singing, or talking on the phone cause you to cough   ?               yes  Neurogenic/Airway score 3     Current outpatient prescriptions:aspirin 81 MG tablet, Take 81 mg by mouth daily., Disp: , Rfl: ;  cetirizine (ZYRTEC) 10 MG tablet, Take 10 mg by mouth daily., Disp: , Rfl: ;  glimepiride (AMARYL) 4 MG tablet, Take 1 tablet by mouth 2 (two) times daily., Disp: , Rfl: ;  metFORMIN (GLUCOPHAGE) 500 MG tablet, Take 500 mg by mouth 2 (two) times daily with a meal., Disp: , Rfl:  montelukast (SINGULAIR) 10 MG tablet, Take 1 tablet by mouth daily., Disp: , Rfl: ;  omeprazole (PRILOSEC) 20 MG capsule, Take 1 capsule by mouth daily., Disp: , Rfl: ;  simvastatin (ZOCOR) 40 MG tablet, Take 1 tablet by mouth daily., Disp: , Rfl: ;  traMADol (ULTRAM) 50 MG tablet, Take 1 tablet by mouth every 6 (six) hours as needed. , Disp: , Rfl: ;  valsartan (DIOVAN) 160 MG tablet, One daily, Disp: , Rfl:    Review of Systems +v for cough - negative for others    Objective:   Physical Exam  GEN: A/Ox3; pleasant , NAD eldelry   HEENT:  [place]/AT,  EACs-clear, TMs-wnl, NOSE-clear, THROAT-clear, no lesions, no postnasal drip or exudate noted.   NECK:  Supple w/ fair ROM; no JVD; normal carotid impulses w/o bruits; no thyromegaly or nodules palpated; no lymphadenopathy.  RESP  Clear  P & A; w/o, wheezes/ rales/ or rhonchi.no accessory muscle use, no dullness to percussion  CARD:  RRR, no m/r/g  , no peripheral edema, pulses intact, no cyanosis or clubbing.  GI:   Soft & nt; nml bowel  sounds; no organomegaly or masses detected.  Musco: Warm bil, no deformities or joint swelling noted.   Neuro: alert, no focal deficits noted.    Skin: Warm, no lesions or rashes        Assessment & Plan:

## 2014-01-23 NOTE — Sleep Study (Signed)
   NAME: Matthew Harrell DATE OF BIRTH:  01/19/1941 MEDICAL RECORD NUMBER [id number]  LOCATION: [facility]  PHYSICIAN: Barbaraann ShareCLANCE,KEITH M  DATE OF STUDY: 01/15/2014  SLEEP STUDY TYPE: Nocturnal Polysomnogram               REFERRING PHYSICIAN: Parrett, Tammy S, NP  INDICATION FOR STUDY: Hypersomnia with sleep apnea  EPWORTH SLEEPINESS SCORE:  6 HEIGHT: 5\' 3"  (160 cm)  WEIGHT: 160 lb (72.576 kg)    Body mass index is 28.35 kg/(m^2).  NECK SIZE: 18 in.  SLEEP ARCHITECTURE: The patient had a total sleep time of 326 minutes, with no slow-wave sleep and decreased quantity of REM. Sleep onset latency was normal at 3.5 minutes, and REM onset was normal at 79 minutes. Sleep efficiency was 97% during the diagnostic portion of the study, an 87% during the titration portion.  RESPIRATORY DATA: The patient underwent a split night study where he was found to have 121 obstructive events in the first 136 minutes of sleep. This gave him an AHI of 53 of events per hour during the diagnostic portion of the study. The events occurred all in the supine position, and there was loud snoring noted throughout. By protocol, the patient was fitted with a medium ResMed air fit F10 full face mask, and CPAP titration was initiated. He was found to have an optimal CPAP at 15-16 cm of water, however did not achieve REM on the final setting.  OXYGEN DATA: There was oxygen desaturation as low as 66% with the patient's events  CARDIAC DATA: No clinically significant arrhythmias were noted  MOVEMENT/PARASOMNIA: No significant limb movements or abnormal behaviors were seen.  IMPRESSION/ RECOMMENDATION:    1) split-night study reveals severe obstructive sleep apnea, with an AHI of 53 events per hour and oxygen desaturation as low as 66% during the diagnostic portion of the study. The patient was then fitted with a medium ResMed air fit F10 full face mask, and found to have an optimal CPAP pressure of 15-16  cm of water. However, it should be noted, that he never achieved REM on the final CPAP setting. The patient should also be encouraged to work on weight loss.     Barbaraann ShareLANCE,KEITH M Diplomate, American Board of Sleep Medicine  ELECTRONICALLY SIGNED ON:  01/23/2014, 6:12 PM [facility] PH: [phone]   FX: [phone] ACCREDITED BY THE AMERICAN ACADEMY OF SLEEP MEDICINE

## 2014-01-23 NOTE — Telephone Encounter (Signed)
Melissa called back. She reports if pt does have DX of OSA based on sleep study and treat with CPAP then we will need to do ONO w/ CPAP RA.  Per Melissa medicare does not accept nocturnal hypoxemia. They don't look at this as a chronic dx.  Will forward to MR regarding sleep study results

## 2014-01-27 ENCOUNTER — Ambulatory Visit: Payer: Medicare Other | Attending: Internal Medicine

## 2014-01-27 DIAGNOSIS — R4701 Aphasia: Secondary | ICD-10-CM | POA: Insufficient documentation

## 2014-01-27 DIAGNOSIS — IMO0001 Reserved for inherently not codable concepts without codable children: Secondary | ICD-10-CM | POA: Insufficient documentation

## 2014-02-01 ENCOUNTER — Telehealth: Payer: Self-pay | Admitting: Internal Medicine

## 2014-02-01 NOTE — Telephone Encounter (Signed)
Patient has svefe OSA with desat at night. Sent another phone message to J[facility]. Sending this to Carver FilaMindy Silva who was CMA for this ov  Dr. Kalman ShanMurali Daulton Harbaugh, M.D., [facility] Pulmonary and Critical Care Medicine Staff Physician [facility] [place] [facility] Pager: [phone], If no answer or between  15:00h - 7:00h: call [phone]  02/01/2014 10:22 PM

## 2014-02-01 NOTE — Assessment & Plan Note (Signed)
 #  Chronic cough Cough is from sinus drainage, acid reflux, possilbe asthma,  All of this is working together to cause cyclical cough/LPR cough  #Sinus drainage  - continue prior measures  #Possible Acid Reflux  - coontinue otc omeprazole  20mg   1 capsule daily on empty stomach (nurse will send script)   -  avoid colas, spices, cheeses, spirits, red meats, beer, chocolates, fried foods etc.,   - sleep with head end of bed elevated  - eat small frequent meals  - do not go to bed for 3 hours after last meal  #Possible Asthma  - start QVAR 80mcg 2 puff twice daily - take samples and script  and show inhaler technique  #Cyclical cough  - please choose 2-3 days and observe complete voice rest - no talking or whispering  - at all times there  there is urge to cough, drink water or swallow or sip on throat lozenge - refer neuro rehab MR Schinke  - Take gabapentin 300mg  once daily x 3 days, then 300mg  twice daily x 3 days, then 300mg  three times daily to continue. If this makes you too sleepy or drowsy call us and we will cut your medication dosing down   #Followup - I will see you in 4-6 weeks.  - any problems call or come sooner

## 2014-02-01 NOTE — Telephone Encounter (Signed)
Sleep stuidy shows severe OSA. Give him appt with DrClance because he read the sleep study. If [facility] is booked out > 1 month, then give it to others provided [facility] with [facility]  Dr. Kalman ShanMurali Artez Harrell, M.D., [facility] Pulmonary and Critical Care Medicine Staff Physician [facility] [place] [facility] and [facility] Pager: [phone], If no answer or between  15:00h - 7:00h: call [phone]  02/01/2014 10:21 PM

## 2014-02-01 NOTE — Assessment & Plan Note (Signed)
#  Oxygenation issues  - await sleep study 01/15/14; If you donot hear from us by end of week call us

## 2014-02-02 ENCOUNTER — Ambulatory Visit: Payer: Medicare Other | Attending: Internal Medicine

## 2014-02-02 DIAGNOSIS — R4701 Aphasia: Secondary | ICD-10-CM | POA: Insufficient documentation

## 2014-02-02 DIAGNOSIS — IMO0001 Reserved for inherently not codable concepts without codable children: Secondary | ICD-10-CM | POA: Insufficient documentation

## 2014-02-02 NOTE — Telephone Encounter (Signed)
Spoke with Efraim KaufmannMelissa  She states nothing is needed for this pt  Will close encounter

## 2014-02-02 NOTE — Telephone Encounter (Signed)
Melissa @ [facility] is asking to speak w/ JJ & can be reached at [phone]

## 2014-02-02 NOTE — Telephone Encounter (Signed)
Matthew Harrell returned call  °

## 2014-02-02 NOTE — Telephone Encounter (Signed)
KC with opening on 2.9.15 @ 1100   Called spoke with patient and advised of sleep study results / recs as stated by MR below. Pt verbalized his understanding and denied any questions at this time. [facility] 1100, pt aware to arrive 15-7020mins early for consult paperwork.      [facility], she stated she will need to look into this further to see if anything else is needed.  She will call the office back w/ update.

## 2014-02-02 NOTE — Telephone Encounter (Signed)
KC with opening on 2.9.15 @ 1100   Called spoke with patient and advised of sleep study results / recs as stated by MR below.  Pt verbalized his understanding and denied any questions at this time.  [facility] 1100, pt aware to arrive 15-8520mins early for consult paperwork.  Nothing further needed at this time; will sign off.

## 2014-02-02 NOTE — Telephone Encounter (Signed)
LMTCBx1 for Matthew Harrell. [name], CMA

## 2014-02-06 ENCOUNTER — Encounter: Payer: Self-pay | Admitting: Pulmonary Disease

## 2014-02-06 ENCOUNTER — Ambulatory Visit (INDEPENDENT_AMBULATORY_CARE_PROVIDER_SITE_OTHER): Payer: Medicare Other | Admitting: Pulmonary Disease

## 2014-02-06 VITALS — BP 150/82 | HR 94 | Temp 97.7°F | Ht 63.0 in | Wt 176.0 lb

## 2014-02-06 DIAGNOSIS — G4733 Obstructive sleep apnea (adult) (pediatric): Secondary | ICD-10-CM | POA: Insufficient documentation

## 2014-02-06 NOTE — Progress Notes (Signed)
Subjective:    Patient ID: Matthew Harrell, male    DOB: 06/07/1941, 73 y.o.   MRN: [id number]  HPI The patient is a 73 year old male who I've been asked to see for management of obstructive sleep apnea. He recently had a sleep study which showed severe OSA, with an AHI of 53 events per hour. He was titrated on CPAP as high as 16 cm of water with what appears to be adequate control, but had very little total sleep time on the final setting. The patient has been noted to have very loud snoring as well as severe abnormal breathing pattern during sleep. He awakens at least twice a night, and is not rested greater than 50% of the mornings. He falls asleep easily with any period of inactivity, whether it be day or night. He has some sleep pressure driving longer distances. The patient states that his weight is neutral over the last 2 years, and his Epworth score today is 7.   Sleep Questionnaire What time do you typically go to bed?( Between what hours) 10p-12a 10p-12a at 1114 on 02/06/14 by Maisie FusAshtyn M Green, CMA How long does it take you to fall asleep? 30-8040mins 30-3240mins at 1114 on 02/06/14 by Maisie FusAshtyn M Green, CMA How many times during the night do you wake up? 2 2 at 1114 on 02/06/14 by Maisie FusAshtyn M Green, CMA What time do you get out of bed to start your day? No Value 9-11 at 1114 on 02/06/14 by Maisie FusAshtyn M Green, CMA Do you drive or operate heavy machinery in your occupation? No No at 1114 on 02/06/14 by Maisie FusAshtyn M Green, CMA How much has your weight changed (up or down) over the past two years? (In pounds) 6 lb (2.722 kg) 6 lb (2.722 kg) at 1114 on 02/06/14 by Maisie FusAshtyn M Green, CMA Have you ever had a sleep study before? Yes Yes at 1114 on 02/06/14 by Maisie FusAshtyn M Green, CMA If yes, [facility] [facility] at 1114 on 02/06/14 by Maisie FusAshtyn M Green, CMA If yes, date of study? 12/2013 12/2013 at 1114 on 02/06/14 by Maisie FusAshtyn M Green, CMA Do you currently use CPAP? No No at 1114 on 02/06/14 by  Maisie FusAshtyn M Green, CMA Do you wear oxygen at any time? NoNo supposed to have O2 hs per pt at 1114 on 02/06/14 by Maisie FusAshtyn M Green, CMA   Review of Systems  Constitutional: Negative for fever and unexpected weight change.  HENT: Positive for congestion, sore throat and voice change. Negative for dental problem, ear pain, nosebleeds, postnasal drip, rhinorrhea, sinus pressure, sneezing and trouble swallowing.   Eyes: Negative for redness and itching.  Respiratory: Positive for cough and shortness of breath. Negative for chest tightness and wheezing.   Cardiovascular: Negative for palpitations and leg swelling.  Gastrointestinal: Negative for nausea and vomiting.  Genitourinary: Negative for dysuria.  Musculoskeletal: Positive for arthralgias. Negative for joint swelling.  Skin: Negative for rash.  Neurological: Negative for headaches.  Hematological: Does not bruise/bleed easily.  Psychiatric/Behavioral: Negative for dysphoric mood. The patient is not nervous/anxious.        Objective:   Physical Exam Constitutional:  Well developed, no acute distress  HENT:  Nares patent without discharge, but narrowed bilat.  Oropharynx without exudate, palate and uvula are thick and elongated.   Eyes:  Perrla, eomi, no scleral icterus  Neck:  No JVD, no TMG  Cardiovascular:  Normal rate, regular rhythm, no rubs or gallops.  No murmurs  Intact distal pulses  Pulmonary :  Normal breath sounds, no stridor or respiratory distress   No rales, rhonchi, or wheezing  Abdominal:  Soft, nondistended, bowel sounds present.  No tenderness noted.   Musculoskeletal:  minimal lower extremity edema noted.  Lymph Nodes:  No cervical lymphadenopathy noted  Skin:  No cyanosis noted  Neurologic:  Alert, appropriate, moves all 4 extremities without obvious deficit.         Assessment & Plan:

## 2014-02-06 NOTE — Assessment & Plan Note (Signed)
The patient has been diagnosed with severe obstructive sleep apnea by his recent sleep study, and will need a trial of CPAP while working on aggressive weight loss. It had a long discussion with him about the pathophysiology of sleep apnea, including its impact to his quality of life and cardiovascular health. His best treatment is with CPAP while he is working on weight loss, and the patient is willing to try. I will set the patient up on cpap at a moderate pressure level to allow for desensitization, and will troubleshoot the device over the next 4-6weeks if needed.  The pt is to call me if having issues with tolerance.  Will then optimize the pressure once patient is able to wear cpap on a consistent basis.

## 2014-02-06 NOTE — Patient Instructions (Signed)
Will set up on cpap at a moderate pressure level.  Please call if having tolerance issues.  Work on weight loss followup with me again in 8 weeks.

## 2014-02-09 ENCOUNTER — Ambulatory Visit: Payer: Medicare Other

## 2014-02-23 ENCOUNTER — Ambulatory Visit: Payer: Medicare Other | Admitting: Internal Medicine

## 2014-04-03 ENCOUNTER — Encounter: Payer: Self-pay | Admitting: Pulmonary Disease

## 2014-04-03 ENCOUNTER — Ambulatory Visit (INDEPENDENT_AMBULATORY_CARE_PROVIDER_SITE_OTHER): Payer: Medicare Other | Admitting: Pulmonary Disease

## 2014-04-03 VITALS — BP 160/68 | HR 84 | Temp 97.7°F | Ht 63.0 in | Wt 172.2 lb

## 2014-04-03 DIAGNOSIS — G4733 Obstructive sleep apnea (adult) (pediatric): Secondary | ICD-10-CM

## 2014-04-03 NOTE — Patient Instructions (Signed)
Will change your machine pressure to the auto setting to better treat your sleep apnea Will have advanced work with you on better mask fit. Work on weight loss Would take chlorpheniramine 4mg  otc at bedtime each night, then take your zyrtec each am.  followup with me again in 6mos.

## 2014-04-03 NOTE — Progress Notes (Signed)
   Subjective:    Patient ID: Matthew Harrell, male    DOB: 10/01/1941, 73 y.o.   MRN: [id number]  HPI Patient comes in today for followup of his obstructive sleep apnea. He was started on CPAP at the last visit, and is been wearing the device compliantly by his download. He has had significant improvement in his AHI, but is still having breakthrough events. I reminded him that we have yet to optimize his pressure. He denies any issues with her mask fit, but the download does show some increased leak. He feels that he is sleeping better, with improved daytime alertness.   Review of Systems  Constitutional: Negative for fever and unexpected weight change.  HENT: Positive for congestion, sinus pressure and sore throat. Negative for dental problem, ear pain, nosebleeds, postnasal drip, rhinorrhea, sneezing and trouble swallowing.   Eyes: Negative for redness and itching.  Respiratory: Positive for cough. Negative for chest tightness, shortness of breath and wheezing.   Cardiovascular: Negative for palpitations and leg swelling.  Gastrointestinal: Negative for nausea and vomiting.  Genitourinary: Negative for dysuria.  Musculoskeletal: Negative for joint swelling.  Skin: Negative for rash.  Neurological: Negative for headaches.  Hematological: Does not bruise/bleed easily.  Psychiatric/Behavioral: Negative for dysphoric mood. The patient is not nervous/anxious.        Objective:   Physical Exam Overweight male in no acute distress Nose without purulence or discharge noted No skin breakdown or pressure necrosis from the CPAP mask Neck without lymphadenopathy or thyromegaly Lower extremities without significant edema, no cyanosis Alert and oriented, moves all 4 extremities.       Assessment & Plan:

## 2014-04-03 NOTE — Assessment & Plan Note (Signed)
The patient appears to be doing well with CPAP, and has had considerable improvement in his AHI in his symptoms. I will change his pressure over to the automatic setting in order to better treat his sleep apnea, and I've encouraged the patient work aggressively on weight loss.

## 2014-10-03 ENCOUNTER — Ambulatory Visit: Payer: Medicare Other | Admitting: Pulmonary Disease

## 2018-08-23 ENCOUNTER — Emergency Department (HOSPITAL_COMMUNITY)
Admission: EM | Admit: 2018-08-23 | Discharge: 2018-08-23 | Disposition: A | Discharge status: Home / Self Care | Payer: Medicare Other | Attending: Emergency Medicine | Admitting: Emergency Medicine

## 2018-08-23 ENCOUNTER — Other Ambulatory Visit: Payer: Self-pay

## 2018-08-23 ENCOUNTER — Emergency Department (HOSPITAL_COMMUNITY): Payer: Medicare Other

## 2018-08-23 ENCOUNTER — Encounter (HOSPITAL_COMMUNITY): Payer: Self-pay | Admitting: Emergency Medicine

## 2018-08-23 DIAGNOSIS — Z7984 Long term (current) use of oral hypoglycemic drugs: Secondary | ICD-10-CM | POA: Insufficient documentation

## 2018-08-23 DIAGNOSIS — M549 Dorsalgia, unspecified: Secondary | ICD-10-CM | POA: Diagnosis present

## 2018-08-23 DIAGNOSIS — J189 Pneumonia, unspecified organism: Secondary | ICD-10-CM

## 2018-08-23 DIAGNOSIS — Z87891 Personal history of nicotine dependence: Secondary | ICD-10-CM | POA: Insufficient documentation

## 2018-08-23 DIAGNOSIS — Z7982 Long term (current) use of aspirin: Secondary | ICD-10-CM | POA: Insufficient documentation

## 2018-08-23 DIAGNOSIS — I10 Essential (primary) hypertension: Secondary | ICD-10-CM | POA: Diagnosis not present

## 2018-08-23 DIAGNOSIS — E119 Type 2 diabetes mellitus without complications: Secondary | ICD-10-CM | POA: Insufficient documentation

## 2018-08-23 DIAGNOSIS — J181 Lobar pneumonia, unspecified organism: Secondary | ICD-10-CM | POA: Insufficient documentation

## 2018-08-23 LAB — CBC WITH DIFFERENTIAL/PLATELET
Basophils Absolute: 0 10*3/uL (ref 0.0–0.1)
Basophils Relative: 0 %
Eosinophils Absolute: 0.2 10*3/uL (ref 0.0–0.7)
Eosinophils Relative: 2 %
HEMATOCRIT: 36.2 % — AB (ref 39.0–52.0)
HEMOGLOBIN: 11.8 g/dL — AB (ref 13.0–17.0)
LYMPHS ABS: 1.2 10*3/uL (ref 0.7–4.0)
Lymphocytes Relative: 14 %
MCH: 32.2 pg (ref 26.0–34.0)
MCHC: 32.6 g/dL (ref 30.0–36.0)
MCV: 98.9 fL (ref 78.0–100.0)
MONOS PCT: 10 %
Monocytes Absolute: 0.9 10*3/uL (ref 0.1–1.0)
NEUTROS ABS: 6.6 10*3/uL (ref 1.7–7.7)
NEUTROS PCT: 74 %
Platelets: 300 10*3/uL (ref 150–400)
RBC: 3.66 MIL/uL — ABNORMAL LOW (ref 4.22–5.81)
RDW: 12.1 % (ref 11.5–15.5)
WBC: 8.8 10*3/uL (ref 4.0–10.5)

## 2018-08-23 LAB — COMPREHENSIVE METABOLIC PANEL
ALK PHOS: 83 U/L (ref 38–126)
ALT: 8 U/L (ref 0–44)
ANION GAP: 10 (ref 5–15)
AST: 18 U/L (ref 15–41)
Albumin: 4.3 g/dL (ref 3.5–5.0)
BILIRUBIN TOTAL: 0.9 mg/dL (ref 0.3–1.2)
BUN: 53 mg/dL — ABNORMAL HIGH (ref 8–23)
CALCIUM: 9.1 mg/dL (ref 8.9–10.3)
CO2: 26 mmol/L (ref 22–32)
Chloride: 102 mmol/L (ref 98–111)
Creatinine, Ser: 1.93 mg/dL — ABNORMAL HIGH (ref 0.61–1.24)
GFR calc Af Amer: 37 mL/min — ABNORMAL LOW (ref >60)
GFR calc non Af Amer: 32 mL/min — ABNORMAL LOW (ref >60)
Glucose, Bld: 116 mg/dL — ABNORMAL HIGH (ref 70–99)
Potassium: 4.7 mmol/L (ref 3.5–5.1)
Sodium: 138 mmol/L (ref 135–145)
TOTAL PROTEIN: 8.2 g/dL — AB (ref 6.5–8.1)

## 2018-08-23 LAB — URINALYSIS, ROUTINE W REFLEX MICROSCOPIC
Bilirubin Urine: NEGATIVE
GLUCOSE, UA: NEGATIVE mg/dL
Hgb urine dipstick: NEGATIVE
Ketones, ur: NEGATIVE mg/dL
LEUKOCYTES UA: NEGATIVE
NITRITE: NEGATIVE
PROTEIN: NEGATIVE mg/dL
Specific Gravity, Urine: 1.019 (ref 1.005–1.030)
pH: 5 (ref 5.0–8.0)

## 2018-08-23 MED ORDER — HYDROCODONE-ACETAMINOPHEN 5-325 MG PO TABS: 1.0000 | ORAL_TABLET | Freq: Four times a day (QID) | ORAL | 0 refills | Status: DC | PRN

## 2018-08-23 MED ORDER — DOXYCYCLINE HYCLATE 100 MG PO CAPS: 100.0000 mg | ORAL_CAPSULE | Freq: Two times a day (BID) | ORAL | 0 refills | Status: DC

## 2018-08-23 MED ORDER — IOHEXOL 300 MG/ML  SOLN
75.0000 mL | Freq: Once | INTRAMUSCULAR | Status: AC | PRN
  Administered 2018-08-23: 60 mL via INTRAVENOUS

## 2018-08-23 MED ORDER — DOXYCYCLINE HYCLATE 100 MG PO TABS
100.0000 mg | ORAL_TABLET | Freq: Once | ORAL | Status: AC
  Administered 2018-08-23: 100 mg via ORAL
  Filled 2018-08-23: qty 1

## 2018-08-23 MED ORDER — LEVOFLOXACIN 500 MG PO TABS: 500.0000 mg | ORAL_TABLET | Freq: Once | ORAL | Status: DC

## 2018-08-23 NOTE — ED Triage Notes (Signed)
Pain in bilateral legs when walking.  Started x 2 weeks ago.  Denies injury

## 2018-08-23 NOTE — ED Notes (Signed)
Patient has been in the bed for 2 weeks. Usually stays active and walks daily. Daughter states pt is confused at times.

## 2018-08-23 NOTE — Discharge Instructions (Addendum)
Follow-up with your doctor this week for recheck. 

## 2018-08-23 NOTE — ED Notes (Signed)
Pedal pulses present.

## 2018-08-23 NOTE — ED Notes (Signed)
Pt taken to xray 

## 2018-08-24 NOTE — ED Provider Notes (Signed)
[facility] EMERGENCY DEPARTMENT Provider Note   CSN: [id number] Arrival date & time: 08/23/18  1631     History   Chief Complaint Chief Complaint  Patient presents with  . Claudication    HPI Genevieve Norlanderrthur Manges is a 77 y.o. male.  Patient with back pain weakness and cough for a number days.  The history is provided by the patient. No language interpreter was used.  Illness  This is a new problem. The current episode started more than 2 days ago. The problem occurs constantly. The problem has not changed since onset.Pertinent negatives include no chest pain, no abdominal pain and no headaches. Nothing aggravates the symptoms. Nothing relieves the symptoms. He has tried nothing for the symptoms. The treatment provided no relief.    Past Medical History:  Diagnosis Date  . Arthritis   . Diabetes (HCC)   . Hypertension     Patient Active Problem List   Diagnosis Date Noted  . OSA (obstructive sleep apnea) 02/06/2014  . Nocturnal hypoxemia 12/14/2013  . Abnormal CT scan, chest 11/27/2013  . Cough 11/10/2013    Past Surgical History:  Procedure Laterality Date  . NASAL SINUS SURGERY    . TOTAL HIP ARTHROPLASTY     Left        Home Medications    Prior to Admission medications   Medication Sig Start Date End Date Taking? Authorizing Provider  albuterol (PROVENTIL HFA;VENTOLIN HFA) 108 (90 Base) MCG/ACT inhaler Inhale 1-2 puffs into the lungs every 6 (six) hours as needed for wheezing or shortness of breath.   Yes [provider]  amLODipine (NORVASC) 5 MG tablet Take 5 mg by mouth daily. 08/10/18  Yes [provider]  aspirin 81 MG tablet Take 81 mg by mouth daily.   Yes [provider]  atorvastatin (LIPITOR) 20 MG tablet Take 20 mg by mouth daily.   Yes [provider]  diclofenac sodium (VOLTAREN) 1 % GEL Apply 2 g topically 4 (four) times daily.   Yes [provider]  fluticasone (FLONASE) 50 MCG/ACT nasal spray Place  2 sprays into both nostrils daily.   Yes [provider]  glimepiride (AMARYL) 4 MG tablet Take 1 tablet by mouth 2 (two) times daily. 08/20/13  Yes [provider]  loratadine (CLARITIN) 10 MG tablet Take 10 mg by mouth daily.   Yes [provider]  metFORMIN (GLUCOPHAGE) 500 MG tablet Take 500 mg by mouth 2 (two) times daily with a meal.   Yes [provider]  montelukast (SINGULAIR) 10 MG tablet Take 1 tablet by mouth at bedtime.  10/04/13  Yes [provider]  omeprazole (PRILOSEC) 20 MG capsule Take 1 capsule by mouth daily. 09/06/13  Yes [provider]  amoxicillin-clavulanate (AUGMENTIN) 875-125 MG tablet Take 1 tablet by mouth 2 (two) times daily. 7 day course starting on 08/10/18 08/10/18   [provider]  doxycycline (VIBRAMYCIN) 100 MG capsule Take 1 capsule (100 mg total) by mouth 2 (two) times daily. One po bid x 7 days 08/23/18   Bethann BerkshireZammit, Deangelo Berns, MD  HYDROcodone-acetaminophen (NORCO/VICODIN) 5-325 MG tablet Take 1 tablet by mouth every 6 (six) hours as needed for moderate pain. 08/23/18   Bethann BerkshireZammit, Danaiya Steadman, MD    Family History Family History  Problem Relation Age of Onset  . Diabetes Brother   . Stomach cancer Sister   . Aneurysm Mother [id number]  . Heart attack Father [id number]    Social History Social History   Tobacco Use  .  Smoking status: Former Smoker    Packs/day: 1.00    Years: 10.00    Pack years: 10.00    Types: Cigarettes    Last attempt to quit: 12/29/1982    Years since quitting: 35.6  . Smokeless tobacco: Never Used  Substance Use Topics  . Alcohol use: No  . Drug use: No     Allergies   Patient has no known allergies.   Review of Systems Review of Systems  Constitutional: Negative for appetite change and fatigue.  HENT: Negative for congestion, ear discharge and sinus pressure.   Eyes: Negative for discharge.  Respiratory: Positive for cough.   Cardiovascular: Negative for chest pain.    Gastrointestinal: Negative for abdominal pain and diarrhea.  Genitourinary: Negative for frequency and hematuria.  Musculoskeletal: Positive for back pain.  Skin: Negative for rash.  Neurological: Negative for seizures and headaches.  Psychiatric/Behavioral: Negative for hallucinations.     Physical Exam Updated Vital Signs BP (!) 147/70 (BP Location: Left Arm)   Pulse (!) 101   Temp 98.7 F (37.1 C) (Oral)   Resp 16   Ht 5\' 3"  (1.6 m)   Wt 70.8 kg   SpO2 92%   BMI 27.63 kg/m   Physical Exam  Constitutional: He is oriented to person, place, and time. He appears well-developed.  HENT:  Head: Normocephalic.  Eyes: Conjunctivae and EOM are normal. No scleral icterus.  Neck: Neck supple. No thyromegaly present.  Cardiovascular: Normal rate and regular rhythm. Exam reveals no gallop and no friction rub.  No murmur heard. Pulmonary/Chest: No stridor. He has no wheezes. He has no rales. He exhibits no tenderness.  Abdominal: He exhibits no distension. There is no tenderness. There is no rebound.  Musculoskeletal: Normal range of motion. He exhibits no edema.  Mild tenderness lumbar spine negative straight leg raise  Lymphadenopathy:    He has no cervical adenopathy.  Neurological: He is oriented to person, place, and time. He exhibits normal muscle tone. Coordination normal.  Skin: No rash noted. No erythema.  Psychiatric: He has a normal mood and affect. His behavior is normal.     ED Treatments / Results  Labs (all labs ordered are listed, but only abnormal results are displayed) Labs Reviewed  CBC WITH DIFFERENTIAL/PLATELET - Abnormal; Notable for the following components:      Result Value   RBC 3.66 (*)    Hemoglobin 11.8 (*)    HCT 36.2 (*)    All other components within normal limits  COMPREHENSIVE METABOLIC PANEL - Abnormal; Notable for the following components:   Glucose, Bld 116 (*)    BUN 53 (*)    Creatinine, Ser 1.93 (*)    Total Protein 8.2 (*)    GFR  calc non Af Amer 32 (*)    GFR calc Af Amer 37 (*)    All other components within normal limits  URINALYSIS, ROUTINE W REFLEX MICROSCOPIC    EKG None  Radiology Dg Chest 2 View  Result Date: 08/23/2018 CLINICAL DATA:  Cough for the past 2 weeks. EXAM: CHEST - 2 VIEW COMPARISON:  Chest radiographs dated 08/28/2011 chest CT dated 11/09/2013. More recent chest radiographs dated 07/11/2016 are not available for comparison. The images are compared to that report. FINDINGS: Poor inspiration. Borderline enlarged cardiac silhouette. Interval small cavitary nodular density or 5th costal cartilage calcification on the right. Otherwise, clear lungs. Thoracic spine degenerative changes and mild scoliosis. Bilateral shoulder degenerative changes and superior migration of the right humeral  head. Cervical spine degenerative changes. Small amount of right carotid artery calcification. IMPRESSION: 1. Interval small cavitary nodular density or 5th costal cartilage calcification on the right. This could be further evaluated with a chest CT with contrast. 2. Borderline cardiomegaly. 3. Small amount of right carotid artery calcification. 4. Bilateral shoulder degenerative changes and evidence of a chronic rotator cuff tear on the right. Electronically Signed   By: Beckie Salts M.D.   On: 08/23/2018 19:36   Dg Lumbar Spine Complete  Result Date: 08/23/2018 CLINICAL DATA:  Bilateral leg pain when walking. No known injury. EXAM: LUMBAR SPINE - COMPLETE 4+ VIEW COMPARISON:  Left hip radiographs obtained at the same time. FINDINGS: Previously described left hip prosthesis, partially included. 6 non-rib-bearing lumbar vertebrae. Mild anterior spur formation at multiple levels. Facet degenerative changes at multiple levels. There is associated grade 1 retrolisthesis in the upper lumbar spine and grade 1 anterolisthesis in the lower lumbar spine. No fractures or pars defects are seen. Extensive arterial calcifications are noted.  IMPRESSION: 1. Degenerative changes, as described above. 2. Extensive atheromatous arterial calcifications. Electronically Signed   By: Beckie Salts M.D.   On: 08/23/2018 19:33   Ct Chest W Contrast  Result Date: 08/23/2018 CLINICAL DATA:  Abnormal chest x-ray.  Cough. EXAM: CT CHEST WITH CONTRAST TECHNIQUE: Multidetector CT imaging of the chest was performed during intravenous contrast administration. CONTRAST:  60mL OMNIPAQUE IOHEXOL 300 MG/ML  SOLN COMPARISON:  Chest x-ray performed today FINDINGS: Cardiovascular: Coronary artery and aortic atherosclerosis. No evidence of aneurysm. Heart is upper limits normal in size. Mediastinum/Nodes: Borderline size scattered mediastinal lymph nodes. Pretracheal lymph node has a short axis diameter of 9 mm. Right paratracheal lymph node has a short axis diameter of 8 mm. Other similarly sized scattered mediastinal lymph nodes. No hilar or axillary adenopathy. Lungs/Pleura: Airspace opacity in the right upper lobe concerning for pneumonia. Bilateral lower lobe airspace opacities, left greater than right could reflect atelectasis or pneumonia. No effusions. Upper Abdomen: Imaging into the upper abdomen shows no acute findings. Musculoskeletal: Chest wall soft tissues are unremarkable. No acute bony abnormality. IMPRESSION: Airspace opacity in the posterior inferior right upper lobe, likely pneumonia. Bilateral lower lobe atelectasis or pneumonia, left greater than right. Recommend follow-up to resolution to exclude underlying malignancy. Borderline sized mediastinal lymph nodes, likely reactive. Coronary artery disease. Aortic Atherosclerosis (ICD10-I70.0). Electronically Signed   By: Charlett Nose M.D.   On: 08/23/2018 21:47   Dg Hip Unilat W Or Wo Pelvis 2-3 Views Left  Result Date: 08/23/2018 CLINICAL DATA:  Bilateral leg pain when walking. No known injury. EXAM: DG HIP (WITH OR WITHOUT PELVIS) 2-3V LEFT COMPARISON:  None. FINDINGS: Left total hip prosthesis. Normal  appearing right hip. No fracture or dislocation seen. IMPRESSION: No acute abnormality. Hardware intact. Electronically Signed   By: Beckie Salts M.D.   On: 08/23/2018 19:31    Procedures Procedures (including critical care time)  Medications Ordered in ED Medications  iohexol (OMNIPAQUE) 300 MG/ML solution 75 mL (60 mLs Intravenous Contrast Given 08/23/18 2131)  doxycycline (VIBRA-TABS) tablet 100 mg (100 mg Oral Given 08/23/18 2207)     Initial Impression / Assessment and Plan / ED Course  I have reviewed the triage vital signs and the nursing notes.  Pertinent labs & imaging results that were available during my care of the patient were reviewed by me and considered in my medical decision making (see chart for details).     CTA chest shows pneumonia.  Lumbar spine  shows degenerative changes.  Patient not severely toxic.  Patient was offered admission for the pneumonia.  But he refused.  He will be sent home with doxycycline and will follow-up with his PCP  Final Clinical Impressions(s) / ED Diagnoses   Final diagnoses:  Community acquired pneumonia of right upper lobe of lung [facility])    ED Discharge Orders         Ordered    doxycycline (VIBRAMYCIN) 100 MG capsule  2 times daily     08/23/18 2203    HYDROcodone-acetaminophen (NORCO/VICODIN) 5-325 MG tablet  Every 6 hours PRN     08/23/18 2203           Bethann Berkshire, MD 08/24/18 1327

## 2018-08-25 ENCOUNTER — Other Ambulatory Visit: Payer: Self-pay

## 2018-08-25 ENCOUNTER — Encounter (HOSPITAL_COMMUNITY): Payer: Self-pay | Admitting: Emergency Medicine

## 2018-08-25 ENCOUNTER — Emergency Department (HOSPITAL_COMMUNITY): Payer: Medicare Other

## 2018-08-25 ENCOUNTER — Observation Stay (HOSPITAL_COMMUNITY)
Admission: EM | Admit: 2018-08-25 | Discharge: 2018-08-27 | Disposition: A | Discharge status: Home / Self Care | Payer: Medicare Other | Attending: Internal Medicine | Admitting: Internal Medicine

## 2018-08-25 DIAGNOSIS — N179 Acute kidney failure, unspecified: Secondary | ICD-10-CM

## 2018-08-25 DIAGNOSIS — E860 Dehydration: Secondary | ICD-10-CM | POA: Diagnosis not present

## 2018-08-25 DIAGNOSIS — J189 Pneumonia, unspecified organism: Principal | ICD-10-CM

## 2018-08-25 DIAGNOSIS — Z7951 Long term (current) use of inhaled steroids: Secondary | ICD-10-CM | POA: Insufficient documentation

## 2018-08-25 DIAGNOSIS — R05 Cough: Secondary | ICD-10-CM

## 2018-08-25 DIAGNOSIS — R2681 Unsteadiness on feet: Secondary | ICD-10-CM | POA: Diagnosis not present

## 2018-08-25 DIAGNOSIS — Z7982 Long term (current) use of aspirin: Secondary | ICD-10-CM | POA: Insufficient documentation

## 2018-08-25 DIAGNOSIS — R059 Cough, unspecified: Secondary | ICD-10-CM

## 2018-08-25 DIAGNOSIS — N183 Chronic kidney disease, stage 3 (moderate): Secondary | ICD-10-CM | POA: Diagnosis not present

## 2018-08-25 DIAGNOSIS — R7989 Other specified abnormal findings of blood chemistry: Secondary | ICD-10-CM

## 2018-08-25 DIAGNOSIS — I129 Hypertensive chronic kidney disease with stage 1 through stage 4 chronic kidney disease, or unspecified chronic kidney disease: Secondary | ICD-10-CM | POA: Insufficient documentation

## 2018-08-25 DIAGNOSIS — M1990 Unspecified osteoarthritis, unspecified site: Secondary | ICD-10-CM | POA: Insufficient documentation

## 2018-08-25 DIAGNOSIS — E1122 Type 2 diabetes mellitus with diabetic chronic kidney disease: Secondary | ICD-10-CM | POA: Diagnosis not present

## 2018-08-25 DIAGNOSIS — G4733 Obstructive sleep apnea (adult) (pediatric): Secondary | ICD-10-CM | POA: Insufficient documentation

## 2018-08-25 DIAGNOSIS — E785 Hyperlipidemia, unspecified: Secondary | ICD-10-CM | POA: Insufficient documentation

## 2018-08-25 DIAGNOSIS — Z7984 Long term (current) use of oral hypoglycemic drugs: Secondary | ICD-10-CM | POA: Diagnosis not present

## 2018-08-25 DIAGNOSIS — N19 Unspecified kidney failure: Secondary | ICD-10-CM

## 2018-08-25 LAB — CBC WITH DIFFERENTIAL/PLATELET
Abs Immature Granulocytes: 0.1 10*3/uL (ref 0.0–0.1)
BASOS PCT: 1 %
Basophils Absolute: 0.1 10*3/uL (ref 0.0–0.1)
EOS PCT: 1 %
Eosinophils Absolute: 0.1 10*3/uL (ref 0.0–0.7)
HEMATOCRIT: 39.6 % (ref 39.0–52.0)
Hemoglobin: 12.6 g/dL — ABNORMAL LOW (ref 13.0–17.0)
Immature Granulocytes: 1 %
Lymphocytes Relative: 15 %
Lymphs Abs: 1.3 10*3/uL (ref 0.7–4.0)
MCH: 31.7 pg (ref 26.0–34.0)
MCHC: 31.8 g/dL (ref 30.0–36.0)
MCV: 99.7 fL (ref 78.0–100.0)
MONOS PCT: 12 %
Monocytes Absolute: 1.1 10*3/uL — ABNORMAL HIGH (ref 0.1–1.0)
Neutro Abs: 5.9 10*3/uL (ref 1.7–7.7)
Neutrophils Relative %: 70 %
Platelets: 320 10*3/uL (ref 150–400)
RBC: 3.97 MIL/uL — AB (ref 4.22–5.81)
RDW: 11.9 % (ref 11.5–15.5)
WBC: 8.5 10*3/uL (ref 4.0–10.5)

## 2018-08-25 LAB — BASIC METABOLIC PANEL
Anion gap: 13 (ref 5–15)
BUN: 49 mg/dL — ABNORMAL HIGH (ref 8–23)
CO2: 25 mmol/L (ref 22–32)
CREATININE: 2.02 mg/dL — AB (ref 0.61–1.24)
Calcium: 9.8 mg/dL (ref 8.9–10.3)
Chloride: 101 mmol/L (ref 98–111)
GFR calc Af Amer: 35 mL/min — ABNORMAL LOW (ref >60)
GFR calc non Af Amer: 30 mL/min — ABNORMAL LOW (ref >60)
GLUCOSE: 199 mg/dL — AB (ref 70–99)
Potassium: 5.2 mmol/L — ABNORMAL HIGH (ref 3.5–5.1)
Sodium: 139 mmol/L (ref 135–145)

## 2018-08-25 LAB — GLUCOSE, CAPILLARY: GLUCOSE-CAPILLARY: 122 mg/dL — AB (ref 70–99)

## 2018-08-25 LAB — I-STAT CG4 LACTIC ACID, ED: Lactic Acid, Venous: 1.36 mmol/L (ref 0.5–1.9)

## 2018-08-25 LAB — TROPONIN I: Troponin I: <0.03 ng/mL (ref <0.03)

## 2018-08-25 MED ORDER — PANTOPRAZOLE SODIUM 40 MG PO TBEC
40.0000 mg | DELAYED_RELEASE_TABLET | Freq: Every day | ORAL | Status: DC
  Administered 2018-08-25 – 2018-08-27 (×3): 40 mg via ORAL
  Filled 2018-08-25 (×3): qty 1

## 2018-08-25 MED ORDER — ACETAMINOPHEN 325 MG PO TABS: 650.0000 mg | ORAL_TABLET | Freq: Four times a day (QID) | ORAL | Status: DC | PRN

## 2018-08-25 MED ORDER — ACETAMINOPHEN 650 MG RE SUPP: 650.0000 mg | Freq: Four times a day (QID) | RECTAL | Status: DC | PRN

## 2018-08-25 MED ORDER — SODIUM CHLORIDE 0.9 % IV SOLN
1.0000 g | Freq: Once | INTRAVENOUS | Status: AC
  Administered 2018-08-25: 1 g via INTRAVENOUS
  Filled 2018-08-25: qty 10

## 2018-08-25 MED ORDER — ADULT MULTIVITAMIN W/MINERALS CH
1.0000 | ORAL_TABLET | Freq: Every day | ORAL | Status: DC
  Administered 2018-08-26 – 2018-08-27 (×2): 1 via ORAL
  Filled 2018-08-25 (×2): qty 1

## 2018-08-25 MED ORDER — SODIUM CHLORIDE 0.9 % IV BOLUS
1000.0000 mL | Freq: Once | INTRAVENOUS | Status: AC
  Administered 2018-08-25: 1000 mL via INTRAVENOUS

## 2018-08-25 MED ORDER — ENOXAPARIN SODIUM 30 MG/0.3ML ~~LOC~~ SOLN
30.0000 mg | SUBCUTANEOUS | Status: DC
  Administered 2018-08-26 – 2018-08-27 (×2): 30 mg via SUBCUTANEOUS
  Filled 2018-08-25 (×2): qty 0.3

## 2018-08-25 MED ORDER — SODIUM CHLORIDE 0.9 % IV SOLN
500.0000 mg | Freq: Once | INTRAVENOUS | Status: AC
  Administered 2018-08-25: 500 mg via INTRAVENOUS
  Filled 2018-08-25: qty 500

## 2018-08-25 NOTE — ED Notes (Signed)
Quick look provider at bedside 

## 2018-08-25 NOTE — ED Notes (Signed)
Patient given a meal

## 2018-08-25 NOTE — ED Triage Notes (Signed)
Pt sent by doctor after [facility]. Currently taking doxycycline, pt daughter says that he was seen at his pcp and she thinks he should be admitted.

## 2018-08-25 NOTE — ED Provider Notes (Signed)
Patient placed in Quick Look pathway, seen and evaluated   Chief Complaint: lung infection  HPI:  Matthew Harrell is a 77 y.o. male who presents to the ED from his PCP office for evaluation and possible admission for pneumonia. Patient was evaluated at [facility] 2 days ago and had CT of chest. Patient was started on Doxycycline and had f/u with his PCP today. Patient's daughter reports that the patient has been in bed the past 2 days and feeling weak. Low grade fever, cough and congestion.   When [facility] was [facility] days ago they wanted to admit him but patient declined admission  ROS: Resp: cough, productive orange color, shortness of breath    Physical Exam:  BP (!) 143/74 (BP Location: Right Arm)   Pulse (!) 103   Temp 98.6 F (37 C) (Oral)   Resp 18   Ht 5\' 3"  (1.6 m)   Wt 70.8 kg   SpO2 100%   BMI 27.63 kg/m    Gen: No distress  Neuro: Awake and Alert  Skin: Warm and dry  Lungs: decreased BS, occasional rales bilateral  Heart: regular rate and rhythm.  Initiation of care has begun. The patient has been counseled on the process, plan, and necessity for staying for the completion/evaluation, and the remainder of the medical screening examination    Matthew Harrell, Matthew Harrell M, NP 08/25/18 Matthew Harrell    [name], [name], MD 08/25/18 1726

## 2018-08-25 NOTE — H&P (Addendum)
Date: 08/25/2018               Patient Name:  Matthew Harrell MRN: [id number]  DOB: 1941-10-26 Age / Sex: 77 y.o., male   PCP: Gwenlyn Fudge, FNP         Medical Service: Internal Medicine Teaching Service         Attending Physician: Dr. Doneen Poisson, MD    First Contact: Dr. Nedra Hai Pager: [phone]  Second Contact: Dr. Caron Presume Pager: [phone]       After Hours (After 5p/  First Contact Pager: [phone]  weekends / holidays): Second Contact Pager: [phone]   Chief Complaint: chronic cough, weakness   History of Present Illness: Matthew Harrell is a 77 y/o male with a PMHx HTN, DM, CKD III, HLD, OSA who presents with 10 day history of fatigue, weakness, and decreased appetite. On 8/13 he visited his PCP for a general check-up and was prescribed one week's worth of Augmentin, an inhaler and Singulair for chronic cough and sinus congestion. Patient did not have any specific symptoms at this time.  He was feeling well until 8/17 when he reports starting to feel  "off", complained of aching in his hips and legs which made his legs feel weak and had difficulty ambulating. Endorses intermittent sharp shooting pains that go into his bilateral lower extremities. He has a longstanding history of OA of the spine and hips (s/p L hip replacement) and states he will intermittently get flare ups certain times of the year based on weather. He takes extra strength Tylenol with moderate relief. Since then, he has been lying in bed most of the day and also endorses decreased appetite. At baseline, he is very active and does everything at home for himself.  His family left for a beach trip, and by the time they returned on 8/26 he looked "ashy"and wouldn't always make sense when he talked. They were also concerned about the notable decline in his activity level since they had left. They took him to [facility] ED where he had CXR and CT scan done which were felt to be consistent with CAP. He was offered admission  at this time, but declined. He was discharged on course of Doxycycline. This morning he saw a new PCP and was told he should be admitted for IV antibiotic treatment of likely pneumonia. He denies any productive cough, chest pain, shortness of breath, or recent f/c.  Patient reports a long-standing history of chronic dry cough, sinus congestion, and post-nasal drip for at least 5 years. He and his family report extensive work-up has been done, but no etiology has been found. He had sinus surgery in 2014 for chronic nasal congestion, but he did not experience much relief in his chronic symptoms. He denies any worsening or change in his cough recently.   ROS: negative for weight changes, changes in vision, palpitations, n/v/d, constipation, abdominal pain, melena, hematochezia, hematuria, dysuria, rashes.   ED course: CBC - white count 12.5; BMP Na 139, K 5.2, Cl 101, bicarb 25, BUN 49, Crt 2.02 (increased from 1.93 2 days prior); trop negative; lactic acid normal. Started on IVF, azithromycin and rocephin.    Meds:  Current Meds  Medication Sig  . albuterol (PROVENTIL HFA;VENTOLIN HFA) 108 (90 Base) MCG/ACT inhaler Inhale 1-2 puffs into the lungs every 6 (six) hours as needed for wheezing or shortness of breath.  Marland Kitchen amLODipine (NORVASC) 5 MG tablet Take 5 mg by mouth daily.  Marland Kitchen aspirin  81 MG tablet Take 81 mg by mouth daily.  Marland Kitchen atorvastatin (LIPITOR) 20 MG tablet Take 20 mg by mouth daily.  . diclofenac sodium (VOLTAREN) 1 % GEL Apply 2 g topically 4 (four) times daily as needed (pain).   Marland Kitchen doxycycline (VIBRAMYCIN) 100 MG capsule Take 1 capsule (100 mg total) by mouth 2 (two) times daily. One po bid x 7 days (Patient taking differently: Take 100 mg by mouth 2 (two) times daily. For 7 days)  . fluticasone (FLONASE) 50 MCG/ACT nasal spray Place 2 sprays into both nostrils daily.  Marland Kitchen glimepiride (AMARYL) 4 MG tablet Take 1 tablet by mouth 2 (two) times daily.  Marland Kitchen HYDROcodone-acetaminophen (NORCO/VICODIN)  5-325 MG tablet Take 1 tablet by mouth every 6 (six) hours as needed for moderate pain.  Marland Kitchen loratadine (CLARITIN) 10 MG tablet Take 10 mg by mouth daily.  . metFORMIN (GLUCOPHAGE) 500 MG tablet Take 500 mg by mouth 2 (two) times daily with a meal.  . montelukast (SINGULAIR) 10 MG tablet Take 1 tablet by mouth at bedtime.   Marland Kitchen omeprazole (PRILOSEC) 20 MG capsule Take 1 capsule by mouth daily.     Allergies: Allergies as of 08/25/2018  . (No Known Allergies)   Past Medical History:  Diagnosis Date  . Arthritis   . Diabetes (HCC)   . Hypertension     Family History: DM (brother), stomach cancer (sister), aneurysm (mother), MI (father, age 71)  Social History: lives in [place] with sister, worked in [employer], 10 pack year tobacco history (quit over 40 years ago)  Review of Systems: A complete ROS was negative except as per HPI.   Physical Exam: Blood pressure 131/74, pulse 99, temperature 98.6 F (37 C), temperature source Oral, resp. rate (!) 22, height 5\' 3"  (1.6 m), weight 70.8 kg, SpO2 96 %. General: awake, alert, sitting up comfortably in bed in NAD HEENT: normocephalic, atraumatic, PEERL, sclera non-icteric, oral mucosa appears moist CV: RRR; no murmurs, gallops or rubs; distal pulses intact bilaterally Resp: no increased work of breathing; diminished lungs sounds throughout but no obvious crackles, wheeze or rhonchi Abd: BS+, abdomen is soft, non-tender, non-distended  Neuro: A&Ox3; strength is 5/5 in major muscle groups, apart from left hip flexor 4/5 due to pain;  is sinus rhythm; no ST segment changes sensation intact; cranial nerves grossly intact  Skin: warm and dry; no evidence of rash or skin breakdown Ext: no lower extremity edema Psych: appropriate mood and affect   EKG: personally reviewed my interpretation is sinus rhythm. No ST segment changes.   CXR: personally reviewed my interpretation is lower segmental lesion of right upper lobe. Degenerative changes of  thoracic spine.   Assessment & Plan by Problem: Active Problems:   Pneumonia  Mr. Balz is a 77 y/o male with a PMHx HTN, DM, CKD III, HLD, OSA who presents with 10 day history of fatigue, weakness, and decreased appetite.   1. Generalized malaise and fatigue: likely CAP; although he does not have pulmonary systems, he does have significant systemic symptoms, along with likely infiltrate on CXR. It is unusual for CAP to show this rapid of improvement within 48 hours of repeat CXR. Will continue antibiotic treatment for CAP and monitor for clinical improvement.  History is inconsistent with aspiration (no known altered consciousness, h/o seizure, dysphagia) and would typically expect more severe clinical picture. Aspiration pneumonitis would be consistent with rapid improvement on CXR, but this does not align with clinical presentation. Likelihood of aspiration is thus quite  low overall given anatomic location of infiltrate seen on CXR, unless he were to be sleeping in an usual position while aspirating. Will continue with current abx course of ceftriaxone and rocephin.   2. Chronic cough - patient reports this has been an ongoing issue for him for at least 5 years. Considered upper airway cough syndrome, but symptoms have not improved despite sinus surgery 5 years ago and daily zyrtec. Cough variant asthma seems less likely, given he has had no improvement with Singulair and albuterol inhalers. 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 This complaint will not be able to be addressed on the acute inpatient setting, but will recommend outpatient follow-up, as it appears he has not seen pulmonology since 2015.   Will place on protonix 40 mg while inpatient and consider increasing his outpatient PPI and adding an H2 blocker at discharge.   3. Progressive CKD: Renal function has  worsened from 1.66 on 8/13 > 1.93 on 8/26 > 2.02 today on admission. May be a progression of his CKD in addition to acute pre-renal injury due to decreased PO intake in the setting of acute infection. He received IVF in the ED. Will repeat BMP in the morning. He also mentioned symptoms of increased frequency and nocturia so have ordered a renal u/s to rule out obstructive etiology.   Diet: HH, CM DVT ppx: lovenox Code: DNR  Dispo: Admit patient to Observation with expected length of stay less than 2 midnights.  SignedBridget Hartshorn: Sueanne Maniaci D, DO 08/25/2018, 10:04 PM  Pager: [phone]

## 2018-08-25 NOTE — ED Notes (Signed)
Patient transported to X-ray 

## 2018-08-25 NOTE — ED Notes (Signed)
Called lab to add troponin to labs already collected

## 2018-08-25 NOTE — ED Provider Notes (Signed)
[facility] EMERGENCY DEPARTMENT Provider Note   CSN: [id number] Arrival date & time: 08/25/18  1626     History   Chief Complaint Chief Complaint  Patient presents with  . Abnormal Lab    HPI Matthew Harrell is a 77 y.o. male.  HPI 77 year old male who was recently seen in the emergency department in [facility] and treated with doxycycline for suspected community-acquired pneumonia.  He presents back to the emergency department today at the request of his primary care physician who believes he needs an patient IV antibiotics for community acquired pneumonia.  He states he is felt generally weak and has had poor appetite over the past several days.  Family states he is just been lying in bed and seems generally weak.  He reports some ongoing cough.  Denies significant shortness of breath.  Denies abdominal pain.  No nausea vomiting or diarrhea.  Some low-grade fevers at home without documentation of elevated temperature.  It was the recommendation of his primary care physician they come to the ER for admission to the hospital for IV antibiotics.   Past Medical History:  Diagnosis Date  . Arthritis   . Diabetes (HCC)   . Hypertension     Patient Active Problem List   Diagnosis Date Noted  . OSA (obstructive sleep apnea) 02/06/2014  . Nocturnal hypoxemia 12/14/2013  . Abnormal CT scan, chest 11/27/2013  . Cough 11/10/2013    Past Surgical History:  Procedure Laterality Date  . NASAL SINUS SURGERY    . TOTAL HIP ARTHROPLASTY     Left        Home Medications    Prior to Admission medications   Medication Sig Start Date End Date Taking? Authorizing Provider  albuterol (PROVENTIL HFA;VENTOLIN HFA) 108 (90 Base) MCG/ACT inhaler Inhale 1-2 puffs into the lungs every 6 (six) hours as needed for wheezing or shortness of breath.    [provider]  amLODipine (NORVASC) 5 MG tablet Take 5 mg by mouth daily. 08/10/18   [provider]  amoxicillin-clavulanate (AUGMENTIN) 875-125 MG tablet Take 1 tablet by mouth 2 (two) times daily. 7 day course starting on 08/10/18 08/10/18   [provider]  aspirin 81 MG tablet Take 81 mg by mouth daily.    [provider]  atorvastatin (LIPITOR) 20 MG tablet Take 20 mg by mouth daily.    [provider]  diclofenac sodium (VOLTAREN) 1 % GEL Apply 2 g topically 4 (four) times daily.    [provider]  doxycycline (VIBRAMYCIN) 100 MG capsule Take 1 capsule (100 mg total) by mouth 2 (two) times daily. One po bid x 7 days 08/23/18   Bethann Berkshire, MD  fluticasone [facility]) 50 MCG/ACT nasal spray Place 2 sprays into both nostrils daily.    [provider]  glimepiride (AMARYL) 4 MG tablet Take 1 tablet by mouth 2 (two) times daily. 08/20/13   [provider]  HYDROcodone-acetaminophen (NORCO/VICODIN) 5-325 MG tablet Take 1 tablet by mouth every 6 (six) hours as needed for moderate pain. 08/23/18   Bethann Berkshire, MD  loratadine (CLARITIN) 10 MG tablet Take 10 mg by mouth daily.    [provider]  metFORMIN (GLUCOPHAGE) 500 MG tablet Take 500 mg by mouth 2 (two) times daily with a meal.    [provider]  montelukast (SINGULAIR) 10 MG tablet Take 1 tablet by mouth at bedtime.  10/04/13   [provider]  omeprazole (PRILOSEC) 20 MG  capsule Take 1 capsule by mouth daily. 09/06/13   [provider]    Family History Family History  Problem Relation Age of Onset  . Diabetes Brother   . Stomach cancer Sister   . Aneurysm Mother 3064  . Heart attack Father 765    Social History Social History   Tobacco Use  . Smoking status: Former Smoker    Packs/day: 1.00    Years: 10.00    Pack years: 10.00    Types: Cigarettes    Last attempt to quit: 12/29/1982    Years since quitting: 35.6  . Smokeless tobacco: Never Used  Substance Use Topics  . Alcohol use: No  . Drug use: No     Allergies     Patient has no known allergies.   Review of Systems Review of Systems  All other systems reviewed and are negative.    Physical Exam Updated Vital Signs BP (!) 143/74 (BP Location: Right Arm)   Pulse (!) 103   Temp 98.6 F (37 C) (Oral)   Resp 18   Ht 5\' 3"  (1.6 m)   Wt 70.8 kg   SpO2 100%   BMI 27.63 kg/m   Physical Exam  Constitutional: He is oriented to person, place, and time. He appears well-developed and well-nourished.  HENT:  Head: Normocephalic and atraumatic.  Eyes: EOM are normal.  Neck: Normal range of motion.  Cardiovascular: Normal rate, regular rhythm, normal heart sounds and intact distal pulses.  Pulmonary/Chest: Effort normal and breath sounds normal. No respiratory distress.  Abdominal: Soft. He exhibits no distension. There is no tenderness.  Musculoskeletal: Normal range of motion.  Neurological: He is alert and oriented to person, place, and time.  Skin: Skin is warm and dry.  Psychiatric: He has a normal mood and affect. Judgment normal.  Nursing note and vitals reviewed.    ED Treatments / Results  Labs (all labs ordered are listed, but only abnormal results are displayed) Labs Reviewed  CBC WITH DIFFERENTIAL/PLATELET - Abnormal; Notable for the following components:      Result Value   RBC 3.97 (*)    Hemoglobin 12.6 (*)    Monocytes Absolute 1.1 (*)    All other components within normal limits  BASIC METABOLIC PANEL - Abnormal; Notable for the following components:   Potassium 5.2 (*)    Glucose, Bld 199 (*)    BUN 49 (*)    Creatinine, Ser 2.02 (*)    GFR calc non Af Amer 30 (*)    GFR calc Af Amer 35 (*)    All other components within normal limits  CULTURE, BLOOD (ROUTINE X 2)  CULTURE, BLOOD (ROUTINE X 2)  TROPONIN I  I-STAT CG4 LACTIC ACID, ED    EKG EKG Interpretation  Date/Time:  Wednesday August 25 2018 18:19:51 EDT Ventricular Rate:  98 PR Interval:    QRS Duration: 89 QT Interval:  318 QTC Calculation: 406 R  Axis:   28 Text Interpretation:  Sinus rhythm Abnormal R-wave progression, early transition No significant change was found Confirmed by Azalia Bilisampos, Johnica Armwood ([id number]) on 08/25/2018 7:36:56 PM   Radiology Dg Chest 2 View  Result Date: 08/25/2018 CLINICAL DATA:  Acute onset shortness of breath. EXAM: CHEST - 2 VIEW COMPARISON:  CT chest 08/23/2018, 11/17/2013. Chest x-ray 08/23/2018. FINDINGS: AP ERECT and LATERAL images were obtained. Interval resolution of the airspace opacities in the INFERIOR RIGHT UPPER LOBE identified on the examinations 2 days ago. Lungs now clear. Pulmonary vascularity normal.  No pleural effusions. Cardiac silhouette mildly enlarged, slightly increased in size since 2014. Thoracic aorta atherosclerotic, unchanged. Hilar and mediastinal contours otherwise unremarkable. Degenerative changes and DISH involving the thoracic spine. IMPRESSION: Resolution of RIGHT UPPER LOBE pneumonia since the examination 2 days ago. No acute cardiopulmonary disease currently. Electronically Signed   By: Hulan Saas M.D.   On: 08/25/2018 19:26   Ct Chest W Contrast  Result Date: 08/23/2018 CLINICAL DATA:  Abnormal chest x-ray.  Cough. EXAM: CT CHEST WITH CONTRAST TECHNIQUE: Multidetector CT imaging of the chest was performed during intravenous contrast administration. CONTRAST:  60mL OMNIPAQUE IOHEXOL 300 MG/ML  SOLN COMPARISON:  Chest x-ray performed today FINDINGS: Cardiovascular: Coronary artery and aortic atherosclerosis. No evidence of aneurysm. Heart is upper limits normal in size. Mediastinum/Nodes: Borderline size scattered mediastinal lymph nodes. Pretracheal lymph node has a short axis diameter of 9 mm. Right paratracheal lymph node has a short axis diameter of 8 mm. Other similarly sized scattered mediastinal lymph nodes. No hilar or axillary adenopathy. Lungs/Pleura: Airspace opacity in the right upper lobe concerning for pneumonia. Bilateral lower lobe airspace opacities, left greater than right  could reflect atelectasis or pneumonia. No effusions. Upper Abdomen: Imaging into the upper abdomen shows no acute findings. Musculoskeletal: Chest wall soft tissues are unremarkable. No acute bony abnormality. IMPRESSION: Airspace opacity in the posterior inferior right upper lobe, likely pneumonia. Bilateral lower lobe atelectasis or pneumonia, left greater than right. Recommend follow-up to resolution to exclude underlying malignancy. Borderline sized mediastinal lymph nodes, likely reactive. Coronary artery disease. Aortic Atherosclerosis (ICD10-I70.0). Electronically Signed   By: Charlett Nose M.D.   On: 08/23/2018 21:47    Procedures Procedures (including critical care time)  Medications Ordered in ED Medications  cefTRIAXone (ROCEPHIN) 1 g in sodium chloride 0.9 % 100 mL IVPB (0 g Intravenous Stopped 08/25/18 1951)  azithromycin (ZITHROMAX) 500 mg in sodium chloride 0.9 % 250 mL IVPB (500 mg Intravenous New Bag/Given 08/25/18 1903)  sodium chloride 0.9 % bolus 1,000 mL (1,000 mLs Intravenous New Bag/Given 08/25/18 1849)     Initial Impression / Assessment and Plan / ED Course  I have reviewed the triage vital signs and the nursing notes.  Pertinent labs & imaging results that were available during my care of the patient were reviewed by me and considered in my medical decision making (see chart for details).    Patient is overall well-appearing.  His vital signs are stable.  His labs are stable.  Chest x-ray is reassuring.  Rocephin and azithromycin given.  He will be observed overnight.  I suspect if he continues to do well throughout the night and tomorrow morning he can be discharged home in the morning to follow back up with his primary care physician.  He may benefit from physical therapy.  He has been somewhat ill over the past 1 to 2 weeks and may be slightly deconditioned.  Final Clinical Impressions(s) / ED Diagnoses   Final diagnoses:  Community acquired pneumonia, unspecified  laterality    ED Discharge Orders    None       Azalia Bilis, MD 08/25/18 2007

## 2018-08-26 DIAGNOSIS — Z79899 Other long term (current) drug therapy: Secondary | ICD-10-CM

## 2018-08-26 DIAGNOSIS — R7989 Other specified abnormal findings of blood chemistry: Secondary | ICD-10-CM

## 2018-08-26 DIAGNOSIS — N19 Unspecified kidney failure: Secondary | ICD-10-CM

## 2018-08-26 DIAGNOSIS — I129 Hypertensive chronic kidney disease with stage 1 through stage 4 chronic kidney disease, or unspecified chronic kidney disease: Secondary | ICD-10-CM

## 2018-08-26 DIAGNOSIS — R918 Other nonspecific abnormal finding of lung field: Secondary | ICD-10-CM

## 2018-08-26 DIAGNOSIS — M159 Polyosteoarthritis, unspecified: Secondary | ICD-10-CM

## 2018-08-26 DIAGNOSIS — R531 Weakness: Secondary | ICD-10-CM | POA: Diagnosis not present

## 2018-08-26 DIAGNOSIS — Z833 Family history of diabetes mellitus: Secondary | ICD-10-CM

## 2018-08-26 DIAGNOSIS — J189 Pneumonia, unspecified organism: Secondary | ICD-10-CM

## 2018-08-26 DIAGNOSIS — R5383 Other fatigue: Secondary | ICD-10-CM | POA: Diagnosis not present

## 2018-08-26 DIAGNOSIS — K219 Gastro-esophageal reflux disease without esophagitis: Secondary | ICD-10-CM

## 2018-08-26 DIAGNOSIS — Z96642 Presence of left artificial hip joint: Secondary | ICD-10-CM

## 2018-08-26 DIAGNOSIS — E1122 Type 2 diabetes mellitus with diabetic chronic kidney disease: Secondary | ICD-10-CM

## 2018-08-26 DIAGNOSIS — Z8249 Family history of ischemic heart disease and other diseases of the circulatory system: Secondary | ICD-10-CM

## 2018-08-26 DIAGNOSIS — R05 Cough: Secondary | ICD-10-CM

## 2018-08-26 DIAGNOSIS — R0982 Postnasal drip: Secondary | ICD-10-CM

## 2018-08-26 DIAGNOSIS — N183 Chronic kidney disease, stage 3 (moderate): Secondary | ICD-10-CM

## 2018-08-26 DIAGNOSIS — N179 Acute kidney failure, unspecified: Secondary | ICD-10-CM

## 2018-08-26 DIAGNOSIS — E785 Hyperlipidemia, unspecified: Secondary | ICD-10-CM

## 2018-08-26 DIAGNOSIS — G4733 Obstructive sleep apnea (adult) (pediatric): Secondary | ICD-10-CM

## 2018-08-26 DIAGNOSIS — E860 Dehydration: Secondary | ICD-10-CM

## 2018-08-26 DIAGNOSIS — Z66 Do not resuscitate: Secondary | ICD-10-CM

## 2018-08-26 LAB — HEMOGLOBIN A1C
HEMOGLOBIN A1C: 7.7 % — AB (ref 4.8–5.6)
MEAN PLASMA GLUCOSE: 174.29 mg/dL

## 2018-08-26 LAB — GLUCOSE, CAPILLARY
GLUCOSE-CAPILLARY: 100 mg/dL — AB (ref 70–99)
GLUCOSE-CAPILLARY: 125 mg/dL — AB (ref 70–99)
Glucose-Capillary: 102 mg/dL — ABNORMAL HIGH (ref 70–99)
Glucose-Capillary: 136 mg/dL — ABNORMAL HIGH (ref 70–99)

## 2018-08-26 LAB — BASIC METABOLIC PANEL
Anion gap: 9 (ref 5–15)
BUN: 42 mg/dL — ABNORMAL HIGH (ref 8–23)
CO2: 21 mmol/L — ABNORMAL LOW (ref 22–32)
Calcium: 8.5 mg/dL — ABNORMAL LOW (ref 8.9–10.3)
Chloride: 109 mmol/L (ref 98–111)
Creatinine, Ser: 1.68 mg/dL — ABNORMAL HIGH (ref 0.61–1.24)
GFR, EST AFRICAN AMERICAN: 44 mL/min — AB (ref >60)
GFR, EST NON AFRICAN AMERICAN: 38 mL/min — AB (ref >60)
Glucose, Bld: 133 mg/dL — ABNORMAL HIGH (ref 70–99)
POTASSIUM: 4.8 mmol/L (ref 3.5–5.1)
SODIUM: 139 mmol/L (ref 135–145)

## 2018-08-26 MED ORDER — SODIUM CHLORIDE 0.9 % IV SOLN
1.0000 g | INTRAVENOUS | Status: DC
  Administered 2018-08-26: 1 g via INTRAVENOUS
  Filled 2018-08-26: qty 10

## 2018-08-26 MED ORDER — AMLODIPINE BESYLATE 5 MG PO TABS
5.0000 mg | ORAL_TABLET | Freq: Every day | ORAL | Status: DC
  Administered 2018-08-26 – 2018-08-27 (×2): 5 mg via ORAL
  Filled 2018-08-26 (×2): qty 1

## 2018-08-26 MED ORDER — AZITHROMYCIN 250 MG PO TABS
250.0000 mg | ORAL_TABLET | Freq: Every day | ORAL | Status: DC
  Administered 2018-08-27: 250 mg via ORAL
  Filled 2018-08-26: qty 1

## 2018-08-26 NOTE — Evaluation (Signed)
Physical Therapy Evaluation Patient Details Name: Matthew Harrell MRN: [id number] DOB: 07/13/1941 Today's Date: 08/26/2018   History of Present Illness  77 yo admitted with fatigue, weakness, PNA. PMHx HTN, DM, CKD III, HLD, OSA, Lt THA  Clinical Impression  Pt pleasant on arrival, [place] and two step, walks two miles everyday at baseline. Pt with noted decrease in activity tolerance from baseline and reports feels "really weak" in bil legs. Pt HR in sitting at 91 and ranged 101-105 through ambulation. SpO2 down to 89 mid-session w/ SOB, verbal cues for pursed lip breathing and quick recovery to 94. Pt educated on energy conservation and taking rest breaks when SOB as well as a HEP to complete 3x a day. Pt w/ deficits noted below (see PT problem list). Pt will benefit from acute skilled therapy to increase functional mobility, activity tolerance, and decrease fall risk. Pt will need assist up stairs into home, family able to support. Further HHPT recommended and pt and family agreeable.      Follow Up Recommendations Supervision for mobility/OOB;Home health PT    Equipment Recommendations  None recommended by PT    Recommendations for Other Services       Precautions / Restrictions Precautions Precautions: Fall Restrictions Weight Bearing Restrictions: No      Mobility  Bed Mobility Overal bed mobility: Needs Assistance Bed Mobility: Rolling;Sidelying to Sit Rolling: Min guard Sidelying to sit: Min assist;HOB elevated       General bed mobility comments: HOb 20 degrees with use of rail to roll and assist to elevate trunk from surface  Transfers Overall transfer level: Needs assistance Equipment used: None Transfers: Sit to/from Stand Sit to Stand: Min assist         General transfer comment: min a for balance, unsteady on standing  Ambulation/Gait Ambulation/Gait assistance: Min guard Gait Distance (Feet): 150 Feet Assistive device: None Gait  Pattern/deviations: Step-through pattern;Decreased stride length;Narrow base of support   Gait velocity interpretation: 1.31 - 2.62 ft/sec, indicative of limited community ambulator General Gait Details: pt looking down with decreased speed from baseline  Stairs Stairs: Yes Stairs assistance: Mod assist Stair Management: One rail Right Number of Stairs: 5 General stair comments: mod a to ascend, difficulty extending knee. min a to descend for control, knees buckling   Wheelchair Mobility    Modified Rankin (Stroke Patients Only)       Balance Overall balance assessment: Needs assistance Sitting-balance support: Bilateral upper extremity supported;Feet supported Sitting balance-Leahy Scale: Fair Sitting balance - Comments: requires bil UE support      Standing balance-Leahy Scale: Fair Standing balance comment: guard for initial rise to gain balance, then able to maintain stand without support                              Pertinent Vitals/Pain Pain Assessment: No/denies pain    Home Living Family/patient expects to be discharged to:: Private residence Living Arrangements: Other relatives(sister ) Available Help at Discharge: Family;Available 24 hours/day Type of Home: House Home Access: Stairs to enter Entrance Stairs-Rails: Right;Left;Can reach both Entrance Stairs-Number of Steps: 3 Home Layout: One level Home Equipment: Walker - 2 wheels;Cane - single point      Prior Function Level of Independence: Independent with assistive device(s)         Comments: normally independent decline over the last few weeks needing a cane, drives     Hand Dominance  Extremity/Trunk Assessment   Upper Extremity Assessment Upper Extremity Assessment: Overall WFL for tasks assessed    Lower Extremity Assessment Lower Extremity Assessment: Generalized weakness    Cervical / Trunk Assessment Cervical / Trunk Assessment: Other exceptions Cervical /  Trunk Exceptions: rounded shoulders  Communication   Communication: HOH  Cognition Arousal/Alertness: Awake/alert Behavior During Therapy: WFL for tasks assessed/performed Overall Cognitive Status: Within Functional Limits for tasks assessed                                        General Comments      Exercises General Exercises - Lower Extremity Long Arc Quad: AROM;10 reps;Seated;Both Hip Flexion/Marching: AROM;10 reps;Seated;Both   Assessment/Plan    PT Assessment Patient needs continued PT services  PT Problem List Decreased strength;Decreased mobility;Decreased activity tolerance;Decreased balance;Decreased safety awareness       PT Treatment Interventions Gait training;Therapeutic exercise;Patient/family education;Functional mobility training;Stair training;DME instruction;Therapeutic activities;Balance training    PT Goals (Current goals can be found in the Care Plan section)  Acute Rehab PT Goals Patient Stated Goal: return home, walk 2 miles PT Goal Formulation: With patient/family Time For Goal Achievement: 09/09/18 Potential to Achieve Goals: Good    Frequency Min 3X/week   Barriers to discharge        Co-evaluation               AM-PAC PT "6 Clicks" Daily Activity  Outcome Measure Difficulty turning over in bed (including adjusting bedclothes, sheets and blankets)?: A Little Difficulty moving from lying on back to sitting on the side of the bed? : Unable Difficulty sitting down on and standing up from a chair with arms (e.g., wheelchair, bedside commode, etc,.)?: None Help needed moving to and from a bed to chair (including a wheelchair)?: None Help needed walking in hospital room?: None Help needed climbing 3-5 steps with a railing? : A Little 6 Click Score: 19    End of Session Equipment Utilized During Treatment: Gait belt Activity Tolerance: Patient tolerated treatment well Patient left: in chair;with family/visitor  present;with call bell/phone within reach Nurse Communication: Mobility status PT Visit Diagnosis: Other abnormalities of gait and mobility (R26.89);Muscle weakness (generalized) (M62.81)    Time: 0940-1007 PT Time Calculation (min) (ACUTE ONLY): 27 min   Charges:   PT Evaluation $PT Eval Moderate Complexity: 1 Mod PT Treatments $Therapeutic Exercise: 8-22 mins        Carma Lair, [place]  Acute Rehab [phone]   Carma Lair 08/26/2018, 10:15 AM

## 2018-08-26 NOTE — Progress Notes (Signed)
   08/25/18 2210  Vitals  Temp 98 F (36.7 C)  Temp Source Oral  BP (!) 148/65  MAP (mmHg) 87  BP Location Right Arm  BP Method Automatic  Patient Position (if appropriate) Sitting  Pulse Rate 98  Resp 18  Oxygen Therapy  SpO2 95 %  O2 Device Room Air  Height and Weight  Height 5\' 1"  (1.549 m)  Weight 67.8 kg  BSA (Calculated - sq m) 1.71 sq meters  BMI (Calculated) 28.24  Weight in (lb) to have BMI = 25 132  Admitted pt to rm 3E13 from ED, pt alert and oriented, denied pain at this time, oriented to room, call bell placed within reach. Family at bedside.

## 2018-08-26 NOTE — Progress Notes (Signed)
PT Cancellation Note  Patient Details Name: Matthew Harrell MRN: [id number] DOB: 08/03/1941   Cancelled Treatment:    Reason Eval/Treat Not Completed: Other (comment)(order D/C'd during eval and eval not completed). Chart reviewed, home setup obtained and MD discontinued order during eval. Family encouraged to mobilize and exercise with pt. Please reorder as appropriate.   [name] 08/26/2018, 7:46 AM  [name], PT [phone]

## 2018-08-26 NOTE — Evaluation (Signed)
Occupational Therapy Evaluation and Discharge Patient Details Name: Genevieve Norlanderrthur Mckenney MRN: [id number] DOB: 09/01/1941 Today's Date: 08/26/2018    History of Present Illness 77 yo admitted with fatigue, weakness, PNA. PMHx HTN, DM, CKD III, HLD, OSA, Lt THA   Clinical Impression   This 77 yo male admitted with above presents to acute OT with all education completed and sister will be at home with him 24/7 and can A prn. No further OT needs, we will sign off.    Follow Up Recommendations  No OT follow up;Supervision/Assistance - 24 hour    Equipment Recommendations  None recommended by OT       Precautions / Restrictions Precautions Precautions: Fall Restrictions Weight Bearing Restrictions: No      Mobility Bed Mobility Overal bed mobility: Needs Assistance Bed Mobility: Supine to Sit     Supine to sit: Supervision;HOB elevated        Transfers Overall transfer level: Needs assistance Equipment used: None Transfers: Sit to/from Stand Sit to Stand: Min guard              Balance Overall balance assessment: Needs assistance Sitting-balance support: No upper extremity supported;Feet supported Sitting balance-Leahy Scale: Good     Standing balance support: No upper extremity supported Standing balance-Leahy Scale: Fair                             ADL either performed or assessed with clinical judgement   ADL Overall ADL's : Needs assistance/impaired Eating/Feeding: Independent;Sitting   Grooming: Min guard;Standing   Upper Body Bathing: Set up;Sitting   Lower Body Bathing: Min guard;Sit to/from stand   Upper Body Dressing : Set up;Sitting   Lower Body Dressing: Min guard;Sit to/from stand   Toilet Transfer: Min guard;Ambulation;Regular Toilet;Grab bars   Toileting- Clothing Manipulation and Hygiene: Min guard;Sit to/from stand     Tub/Shower Transfer Details (indicate cue type and reason): We discussed with family present how the tub  bench would work (two legs sit in tub and two legs out of tub as well as how to manage shower curtain).that he is going to borrow    General ADL Comments: We talked about energy conservation with sitting to do activties (uses less energy and is safer), not to be outside in hot weather or to get into a hot car, taking cooler showers and have vent fan running as well as bathroom door partially open to allow air circulation, purse lipped breathing in increments of 5     Vision Patient Visual Report: No change from baseline              Pertinent Vitals/Pain Pain Assessment: No/denies pain     Hand Dominance Right   Extremity/Trunk Assessment Upper Extremity Assessment Upper Extremity Assessment: Overall WFL for tasks assessed           Communication Communication Communication: HOH   Cognition Arousal/Alertness: Awake/alert Behavior During Therapy: WFL for tasks assessed/performed Overall Cognitive Status: Within Functional Limits for tasks assessed                                                Home Living Family/patient expects to be discharged to:: Private residence Living Arrangements: Other relatives(sister) Available Help at Discharge: Family;Available 24 hours/day Type of Home: House Home Access: Stairs to enter Enter[employer] CorporationEntrance Stairs-Number  of Steps: 3 Entrance Stairs-Rails: Right;Left;Can reach both Home Layout: One level     Bathroom Shower/Tub: Tub/shower unit;Curtain   Bathroom Toilet: Standard(sink beside he can hold onto)     Home Equipment: Environmental consultant - 2 wheels;Cane - single point   Additional Comments: Can borrow a tub bench, has an adjustable bed      Prior Functioning/Environment Level of Independence: Independent with assistive device(s)        Comments: normally independent decline over the last few weeks needing a cane, drives        OT Problem List: Cardiopulmonary status limiting activity(mild increased WOB with activity)          OT Goals(Current goals can be found in the care plan section) Acute Rehab OT Goals Patient Stated Goal: return home and get back to walking again (was walking 2 miles )  OT Frequency:                AM-PAC PT "6 Clicks" Daily Activity     Outcome Measure Help from another person eating meals?: None Help from another person taking care of personal grooming?: A Little Help from another person toileting, which includes using toliet, bedpan, or urinal?: A Little Help from another person bathing (including washing, rinsing, drying)?: A Little Help from another person to put on and taking off regular upper body clothing?: A Little Help from another person to put on and taking off regular lower body clothing?: A Little 6 Click Score: 19   End of Session    Activity Tolerance: Patient tolerated treatment well Patient left: in chair;with call bell/phone within reach;with family/visitor present  OT Visit Diagnosis: Unsteadiness on feet (R26.81)                Time: 4098-1191 OT Time Calculation (min): 28 min Charges:  OT General Charges $OT Visit: 1 Visit OT Evaluation $OT Eval Moderate Complexity: 1 Mod OT Treatments $Self Care/Home Management : 8-22 mins  Ignacia Palma, OTR/L Acute [employer] Pager [phone] Office [phone]

## 2018-08-26 NOTE — Progress Notes (Addendum)
Subjective:  Matthew Harrell is a 77 yo M w/ PMH of DM, HTN, CKDIII, Chronic cough 2/2 post nasal drip, HLD, OSA admit for CAP on admit day 1.  Matthew Harrell was examined and evaluated at bedside this AM with family present. He responds slowly in answering questions. He states he has trouble walking because his legs feel weak. States had episode of chills overnight. He states he often has chronic cough with meals and drinking fluids. Denies any F/N/V/D/C. Family states he is 'not like his usual self.' States he is usually very active and has had no history of difficulty with ambulation in the past. They states that he appeared much more improved after fluid bolus yesterday. Reassured patient and family that he will be kept overnight in the hospital. Encouraged patient to participate in PT and keep himself hydrated. Patient and family expressed understanding.  Objective:  Vital signs in last 24 hours: Vitals:   08/25/18 2100 08/25/18 2210 08/26/18 0458 08/26/18 0954  BP: 131/74 (!) 148/65 (!) 140/97   Pulse: 99 98 92 87  Resp: (!) 22 18 18    Temp:  98 F (36.7 C) 98.4 F (36.9 C)   TempSrc:  Oral Oral   SpO2: 96% 95% 94% 97%  Weight:  67.8 kg 67 kg   Height:  5\' 1"  (1.549 m)     Physical Exam  Constitutional: He is oriented to person, place, and time and well-developed, well-nourished, and in no distress. No distress.  HENT:  Head: Normocephalic and atraumatic.  Mouth/Throat: Oropharynx is clear and moist. No oropharyngeal exudate.  Eyes: Pupils are equal, round, and reactive to light. Conjunctivae and EOM are normal. No scleral icterus.  Neck: Normal range of motion. Neck supple. No JVD present. No thyromegaly present.  Cardiovascular: Normal rate, regular rhythm, normal heart sounds and intact distal pulses.  Pulmonary/Chest: Effort normal. No respiratory distress. He has no wheezes. He has no rales.  Distant breath sounds.   Abdominal: Soft. Bowel sounds are normal. He exhibits no  distension. There is no tenderness. There is no guarding.  Musculoskeletal: Normal range of motion. He exhibits no edema, tenderness or deformity.  Neurological: He is oriented to person, place, and time. No cranial nerve deficit.  lethargic  Skin: Skin is warm and dry. He is not diaphoretic.  Psychiatric: Mood, memory, affect and judgment normal.   Assessment/Plan:  Active Problems:   Pneumonia  Matthew Harrell is a 77 yo M w/ PMH of OSA, HLD, HTN, DM, CKD3 admit for fatigue, weakness, decreased appetite 2/2 community acquired pneumonia  Malaise and Fatigue 2/2 Chemical pneumonitis vs CAP vs Aspiration Presenting with 10 day hx of fatigue, weakness, decreased appetite. Right Lung opacity on Chest X-ray on 8/26 confirmed on CT (posterior inferior right upper lobe). Received 3 day course of doxy. Chest X-ray last night show resolution. Such fast resolution makes CAP unlikely. Hx of significant reflux disease per chart review with pulm makes chemical pneumonitis or aspiration more likely although location inconsistent with aspiration. Vitals stable. No white count. Will proceed with empiric treatment while r/o other causes.  - F/u swallow eval - PT eval - C/w 1g Ceftriaxone IV daily - C/w protonix 40mg  daily  Acute on Chronic CKD 2/2 dehydration Baseline creatinine on chart review 1.4. On admit 1.93, currently 1.68 after fluid bolus. Most likely pre-renal azotemia in setting of infection - C/w monitor BMP - Able to tolerate oral intake. Will restart IVF if needed  HTN Hypertensive this morning 140/97 - C/w home  meds: amlodipine 5mg  daily  DVT prophylaxis: Lovenox Diet: Diabetic Code: DNR  Dispo: Anticipated discharge in approximately 1 day(s).   Theotis Barrio, MD 08/26/2018, 9:56 AM Pager: [phone]

## 2018-08-26 NOTE — Progress Notes (Signed)
Pt refused to go to Renal US at this time, wants to re schedule it in the morning around 8AM.

## 2018-08-26 NOTE — Progress Notes (Signed)
Internal Medicine Attending  Date: 08/26/2018  Patient name: Matthew Harrell Medical record number: [id number] Date of birth: 02/27/1941 Age: 77 y.o. Gender: male  I saw and evaluated the patient. I reviewed the resident's note by Dr. Nedra HaiLee and I agree with the resident's findings and plans as documented in his progress note.  Please see my H&P dated 08/26/2018 and attached to Dr. Murrell ReddenBloomfield's H&P dated 08/25/2018 for the specifics of my evaluation, assessment, plan from earlier in the day.

## 2018-08-27 DIAGNOSIS — J181 Lobar pneumonia, unspecified organism: Secondary | ICD-10-CM

## 2018-08-27 DIAGNOSIS — E1122 Type 2 diabetes mellitus with diabetic chronic kidney disease: Secondary | ICD-10-CM | POA: Diagnosis not present

## 2018-08-27 DIAGNOSIS — I129 Hypertensive chronic kidney disease with stage 1 through stage 4 chronic kidney disease, or unspecified chronic kidney disease: Secondary | ICD-10-CM | POA: Diagnosis not present

## 2018-08-27 DIAGNOSIS — R0982 Postnasal drip: Secondary | ICD-10-CM | POA: Diagnosis not present

## 2018-08-27 LAB — GLUCOSE, CAPILLARY: Glucose-Capillary: 86 mg/dL (ref 70–99)

## 2018-08-27 LAB — BASIC METABOLIC PANEL
ANION GAP: 7 (ref 5–15)
BUN: 32 mg/dL — ABNORMAL HIGH (ref 8–23)
CHLORIDE: 107 mmol/L (ref 98–111)
CO2: 23 mmol/L (ref 22–32)
Calcium: 8.9 mg/dL (ref 8.9–10.3)
Creatinine, Ser: 1.55 mg/dL — ABNORMAL HIGH (ref 0.61–1.24)
GFR calc Af Amer: 48 mL/min — ABNORMAL LOW (ref >60)
GFR calc non Af Amer: 42 mL/min — ABNORMAL LOW (ref >60)
GLUCOSE: 102 mg/dL — AB (ref 70–99)
POTASSIUM: 4.7 mmol/L (ref 3.5–5.1)
Sodium: 137 mmol/L (ref 135–145)

## 2018-08-27 MED ORDER — ASPIRIN 81 MG PO TABS: 81.0000 mg | ORAL_TABLET | Freq: Every day | ORAL | 0 refills | Status: DC

## 2018-08-27 MED ORDER — DOXYCYCLINE HYCLATE 100 MG PO CAPS: 100.0000 mg | ORAL_CAPSULE | Freq: Two times a day (BID) | ORAL | 0 refills | Status: AC

## 2018-08-27 MED ORDER — DOXYCYCLINE HYCLATE 100 MG PO CAPS: 100.0000 mg | ORAL_CAPSULE | Freq: Two times a day (BID) | ORAL | 0 refills | Status: DC

## 2018-08-27 MED ORDER — GLIMEPIRIDE 4 MG PO TABS: 4.0000 mg | ORAL_TABLET | Freq: Two times a day (BID) | ORAL | Status: DC

## 2018-08-27 MED ORDER — MONTELUKAST SODIUM 10 MG PO TABS: 10.0000 mg | ORAL_TABLET | Freq: Every day | ORAL | Status: DC

## 2018-08-27 MED ORDER — METFORMIN HCL 500 MG PO TABS: 500.0000 mg | ORAL_TABLET | Freq: Two times a day (BID) | ORAL | Status: DC

## 2018-08-27 MED ORDER — OMEPRAZOLE 20 MG PO CPDR: 20.0000 mg | DELAYED_RELEASE_CAPSULE | Freq: Every day | ORAL | Status: AC

## 2018-08-27 MED ORDER — SODIUM CHLORIDE 0.9 % IV SOLN
1.0000 g | INTRAVENOUS | Status: DC
  Administered 2018-08-27: 1 g via INTRAVENOUS
  Filled 2018-08-27 (×2): qty 10

## 2018-08-27 NOTE — Care Management Note (Signed)
Case Management Note  Patient Details  Name: Matthew Harrell MRN: [id number] Date of Birth: 12/05/1941  Subjective/Objective:  Community Acquired Pneumonia                 Action/Plan: Patient lives at home with Family member; [facility]; has private insurance with [facility] with prescription drug coverage; [facility] [place]; [facility], [facility]; Dan with [facility] called for arrangements.DME - cane and walker at home, he use them as needed.  Expected Discharge Date:  08/27/18               Expected Discharge Plan:  Home w Home Health Services  Discharge planning Services  CM Consult  Choice offered to:  Patient, Adult Children  HH Arranged:  PT HH Agency:  [facility]  Status of Service:  In process, will continue to follow  Reola MosherChandler, [name], RN,MHA,BSN [phone] 08/27/2018, 10:19 AM

## 2018-08-27 NOTE — Evaluation (Signed)
Clinical/Bedside Swallow Evaluation Patient Details  Name: Matthew Harrell MRN: [id number] Date of Birth: 07/27/1941  Today's Date: 08/27/2018 Time: SLP Start Time (ACUTE ONLY): 40980922 SLP Stop Time (ACUTE ONLY): 0933 SLP Time Calculation (min) (ACUTE ONLY): 11 min  Past Medical History:  Past Medical History:  Diagnosis Date  . Arthritis   . Diabetes (HCC)   . Hypertension    Past Surgical History:  Past Surgical History:  Procedure Laterality Date  . NASAL SINUS SURGERY    . TOTAL HIP ARTHROPLASTY     Left   HPI:  77 yo admitted with fatigue, weakness, PNA. PMHx HTN, DM, CKD III, HLD, OSA, Lt THA.CXR 8/28 resolution of RIGHT UPPER LOBE pneumonia since the examination 2 days ago. No acute cardiopulmonary disease currently. Per MD note he states he often has chronic cough with meals and drinking fluids   Assessment / Plan / Recommendation Clinical Impression  (+) eructation present; hx of GERD and pt affirms daily medication. Coughs with food/liquid "sometimes" but not often per pt and daughter. Hx of bronchitis diagnoses. Mild throat clear following 3 oz water test with trace wet vocal quality. Pt noted to have mildly limited neck ROM and reports arthritis but denies knowledge of cervical osteophytes. No diet modification needed but provided education re: reflux precautions. Educated that pt MAY have slight predisposition to dysphagia and cautioned to be more vigilant during times of illness; in addition oif future pna, may consider modified barium swallow. Continue current diet/liquids. No f/u needed.    SLP Visit Diagnosis: Dysphagia, unspecified (R13.10)    Aspiration Risk  Mild aspiration risk    Diet Recommendation Regular;Thin liquid   Liquid Administration via: Straw;Cup Medication Administration: Whole meds with liquid Supervision: Patient able to self feed Postural Changes: Seated upright at 90 degrees;Remain upright for at least 30 minutes after po intake    Other   Recommendations Oral Care Recommendations: Oral care BID   Follow up Recommendations None      Frequency and Duration            Prognosis        Swallow Study   General HPI: 77 yo admitted with fatigue, weakness, PNA. PMHx HTN, DM, CKD III, HLD, OSA, Lt THA.CXR 8/28 resolution of RIGHT UPPER LOBE pneumonia since the examination 2 days ago. No acute cardiopulmonary disease currently. Per MD note he states he often has chronic cough with meals and drinking fluids Type of Study: Bedside Swallow Evaluation Previous Swallow Assessment: (none) Diet Prior to this Study: Regular;Thin liquids Temperature Spikes Noted: No Respiratory Status: Room air History of Recent Intubation: No Behavior/Cognition: Alert;Cooperative;Pleasant mood Oral Cavity Assessment: Within Functional Limits Oral Care Completed by SLP: No Oral Cavity - Dentition: (upper parital, natural lower) Vision: Functional for self-feeding Self-Feeding Abilities: Able to feed self Patient Positioning: Upright in chair Baseline Vocal Quality: Normal Volitional Cough: Strong Volitional Swallow: Able to elicit    Oral/Motor/Sensory Function Overall Oral Motor/Sensory Function: Within functional limits   Ice Chips Ice chips: Not tested   Thin Liquid Thin Liquid: Within functional limits Presentation: Cup;Straw    Nectar Thick Nectar Thick Liquid: Not tested   Honey Thick Honey Thick Liquid: Not tested   Puree Puree: Not tested   Solid     Solid: Within functional limits      [name], [name] 08/27/2018,9:44 AM   [name] M.Ed [employer]SL[employer] Pager [phone]

## 2018-08-27 NOTE — Discharge Instructions (Signed)
Matthew Harrell  You came to us with complaints of weakness. We saw that you had recent diagnosis of pneumonia. We continued your antibiotic therapy and gave you some IV Fluids. Your labs have improved significantly and your clinical evaluation appear significantly improved as well and you are cleared for discharge. Here are the recommendations for you at discharge:  - Take your doxycycline twice a day for two more days (Saturday and Sunday) - We will establish a home health PT for you. Please work with them to improve your strength - Please call your primary care provider to establish a follow up appointment  Thank you for choosing [facility]

## 2018-08-27 NOTE — Discharge Summary (Signed)
Name: Matthew Harrell MRN: [id number] DOB: Oct 16, 1941 77 y.o. PCP: Gwenlyn Fudge, FNP  Date of Admission: 08/25/2018  5:08 PM Date of Discharge: 08/27/2018 11:00 AM Attending Physician: Doneen Poisson, MD  Discharge Diagnosis: 1. Community Acquired Pneumonia 2. Acute on chronic kidney disease from Pre-renal Azotemia  Discharge Medications: Allergies as of 08/27/2018   No Known Allergies     Medication List    TAKE these medications   albuterol 108 (90 Base) MCG/ACT inhaler Commonly known as:  PROVENTIL HFA;VENTOLIN HFA Inhale 1-2 puffs into the lungs every 6 (six) hours as needed for wheezing or shortness of breath.   amLODipine 5 MG tablet Commonly known as:  NORVASC Take 5 mg by mouth daily.   aspirin 81 MG tablet Take 81 mg by mouth daily.   atorvastatin 20 MG tablet Commonly known as:  LIPITOR Take 20 mg by mouth daily.   diclofenac sodium 1 % Gel Commonly known as:  VOLTAREN Apply 2 g topically 4 (four) times daily as needed (pain).   doxycycline 100 MG capsule Commonly known as:  VIBRAMYCIN Take 1 capsule (100 mg total) by mouth 2 (two) times daily for 2 days. One po bid x 7 days What changed:  additional instructions   fluticasone 50 MCG/ACT nasal spray Commonly known as:  FLONASE Place 2 sprays into both nostrils daily.   glimepiride 4 MG tablet Commonly known as:  AMARYL Take 1 tablet by mouth 2 (two) times daily.   HYDROcodone-acetaminophen 5-325 MG tablet Commonly known as:  NORCO/VICODIN Take 1 tablet by mouth every 6 (six) hours as needed for moderate pain.   loratadine 10 MG tablet Commonly known as:  CLARITIN Take 10 mg by mouth daily.   metFORMIN 500 MG tablet Commonly known as:  GLUCOPHAGE Take 500 mg by mouth 2 (two) times daily with a meal.   montelukast 10 MG tablet Commonly known as:  SINGULAIR Take 1 tablet by mouth at bedtime.   omeprazole 20 MG capsule Commonly known as:  PRILOSEC Take 1 capsule by mouth daily.       Disposition and follow-up:   [name] was discharged from [facility] in Stable condition.  At the hospital follow up visit please address:  1.  CAP:  - discharged after 2 days of IV antibiotics - Please ensure he finished his course of oral antibiotics - Consider working him up for other causes of exertional dyspnea if he is still symptomatic  2. AKI: - Was resolving (admit creatinine 2.02, discharge creatinine 1.55) - Consider checking to see if its back to baseline  2.  Labs / imaging needed at time of follow-up: BMP  3.  Pending labs/ test needing follow-up: Blood Cultures  Follow-up Appointments: Follow-up Information    Call Gwenlyn Fudge, FNP.   Specialty:  Family Medicine Why:  Please call for appointment Contact information: [address] [place] [address] [phone]          Hospital Course by problem list: 1. Malaise and Fatigue 2/2 Chemical pneumonitis vs CAP vs Aspiration: Presenting with 10 day hx of fatigue, weakness, decreased appetite. Went to ED on 8/26, Right Lung opacity on Chest X-ray on 8/26 confirmed on CT (posterior inferior right upper lobe). Received 7 day course of doxy. Family thought patient did not appear to improve so brought back to hospital for admission. Chest X-ray on admission showed resolution. Blood Cultures were negative in 2 days. Received Rocephin and Azithro. Speech and swallow  deemed patient mild risk for aspiration. Clinical improvement in 2 days. Discharged with instructions to finish 7 day course with doxy.  2. AKI on CKD: Presented with creatinine of 2.01 increased from baseline 1.4 Received 1L NS bolus. Creatinine decreased down to 1.55   Discharge Vitals:   BP 136/70   Pulse 85   Temp 98 F (36.7 C) (Oral)   Resp 16   Ht 5\' 1"  (1.549 m)   Wt 65.9 kg Comment: scale c  SpO2 94%   BMI 27.45 kg/m   Pertinent Labs, Studies, and Procedures:  Results for orders placed or performed  during the hospital encounter of 08/25/18 (from the past 24 hour(s))  Glucose, capillary     Status: Abnormal   Collection Time: 08/26/18 11:49 AM  Result Value Ref Range   Glucose-Capillary 102 (H) 70 - 99 mg/dL  Glucose, capillary     Status: Abnormal   Collection Time: 08/26/18  6:03 PM  Result Value Ref Range   Glucose-Capillary 125 (H) 70 - 99 mg/dL   Comment 1 Notify RN   Glucose, capillary     Status: Abnormal   Collection Time: 08/26/18  9:47 PM  Result Value Ref Range   Glucose-Capillary 136 (H) 70 - 99 mg/dL  Basic metabolic panel     Status: Abnormal   Collection Time: 08/27/18  4:18 AM  Result Value Ref Range   Sodium 137 135 - 145 mmol/L   Potassium 4.7 3.5 - 5.1 mmol/L   Chloride 107 98 - 111 mmol/L   CO2 23 22 - 32 mmol/L   Glucose, Bld 102 (H) 70 - 99 mg/dL   BUN 32 (H) 8 - 23 mg/dL   Creatinine, Ser 1.611.55 (H) 0.61 - 1.24 mg/dL   Calcium 8.9 8.9 - 09.610.3 mg/dL   GFR calc non Af Amer 42 (L) >60 mL/min   GFR calc Af Amer 48 (L) >60 mL/min   Anion gap 7 5 - 15  Glucose, capillary     Status: None   Collection Time: 08/27/18  7:38 AM  Result Value Ref Range   Glucose-Capillary 86 70 - 99 mg/dL   Comment 1 Notify RN    Comment 2 Document in Chart      Discharge Instructions: [name]  You came to us with complaints of weakness. We saw that you had recent diagnosis of pneumonia. We continued your antibiotic therapy and gave you some IV Fluids. Your labs have improved significantly and your clinical evaluation appear significantly improved as well and you are cleared for discharge. Here are the recommendations for you at discharge:  - Take your doxycycline twice a day for two more days (Saturday and Sunday) - We will establish a home health PT for you. Please work with them to improve your strength - Please call your primary care provider to establish a follow up appointment  Thank you for choosing [facility]  Discharge Instructions    Call MD for:   difficulty breathing, headache or visual disturbances   Complete by:  As directed    Call MD for:  persistant dizziness or light-headedness   Complete by:  As directed    Call MD for:  persistant nausea and vomiting   Complete by:  As directed    Call MD for:  redness, tenderness, or signs of infection (pain, swelling, redness, odor or green/yellow discharge around incision site)   Complete by:  As directed    Call MD for:  severe uncontrolled pain  Complete by:  As directed    Call MD for:  temperature >100.4   Complete by:  As directed    Diet - low sodium heart healthy   Complete by:  As directed    Increase activity slowly   Complete by:  As directed      Signed: Theotis Barrio, MD 08/27/2018, 10:06 AM   Pager: [phone]

## 2018-08-27 NOTE — Progress Notes (Signed)
Physical Therapy Treatment Patient Details Name: Matthew Harrell MRN: [id number] DOB: 1941-03-27 Today's Date: 08/27/2018    History of Present Illness 77 yo admitted with fatigue, weakness, PNA. PMHx HTN, DM, CKD III, HLD, OSA, Lt THA    PT Comments    Pt pleasant on arrival, dressed and eager to go home. Pt w/ increased balance and activity tolerance today w/ transfers and ambulation, supervision for ambulation and transfers, and min guard for stairs w/ verbal cues for foot clearance. Pt and daughter educated on safety w/ stairs and proper use of gait belt at home for extra support if needed. Pt encouraged to continue HEP for LE strength.   Follow Up Recommendations  Supervision for mobility/OOB;Home health PT     Equipment Recommendations  None recommended by PT    Recommendations for Other Services       Precautions / Restrictions Precautions Precautions: Fall Restrictions Weight Bearing Restrictions: No    Mobility  Bed Mobility               General bed mobility comments: in chair on arrival   Transfers Overall transfer level: Modified independent Equipment used: None Transfers: Sit to/from Stand           General transfer comment: push off arm rest to stand   Ambulation/Gait Ambulation/Gait assistance: Supervision Gait Distance (Feet): 180 Feet Assistive device: None Gait Pattern/deviations: Step-through pattern;Decreased stride length;Narrow base of support   Gait velocity interpretation: >2.62 ft/sec, indicative of community ambulatory General Gait Details: pt looking down with decreased speed from baseline but improved stability from prior date   Stairs Stairs: Yes Stairs assistance: Min guard Stair Management: One rail Right Number of Stairs: 10 General stair comments: 2 trials of 5 steps with difficulty clearing foot on first trial and improved performance 2nd trial w/ verbal cues    Wheelchair Mobility    Modified Rankin (Stroke  Patients Only)       Balance Overall balance assessment: Independent Sitting-balance support: No upper extremity supported;Feet supported Sitting balance-Leahy Scale: Good Sitting balance - Comments: pt getting up and down independently in room prior to session    Standing balance support: No upper extremity supported Standing balance-Leahy Scale: Fair Standing balance comment: increased balance on standing today from prior session, no UE support                             Cognition Arousal/Alertness: Awake/alert Behavior During Therapy: WFL for tasks assessed/performed Overall Cognitive Status: Within Functional Limits for tasks assessed                                        Exercises General Exercises - Lower Extremity Long Arc Quad: AROM;10 reps;Seated;Both Hip Flexion/Marching: AROM;10 reps;Seated;Both    General Comments        Pertinent Vitals/Pain Pain Assessment: No/denies pain    Home Living                      Prior Function            PT Goals (current goals can now be found in the care plan section) Progress towards PT goals: Progressing toward goals    Frequency    Min 3X/week      PT Plan Current plan remains appropriate    Co-evaluation  AM-PAC PT "6 Clicks" Daily Activity  Outcome Measure  Difficulty turning over in bed (including adjusting bedclothes, sheets and blankets)?: A Little Difficulty moving from lying on back to sitting on the side of the bed? : A Little Difficulty sitting down on and standing up from a chair with arms (e.g., wheelchair, bedside commode, etc,.)?: None Help needed moving to and from a bed to chair (including a wheelchair)?: None Help needed walking in hospital room?: None Help needed climbing 3-5 steps with a railing? : A Little 6 Click Score: 21    End of Session Equipment Utilized During Treatment: Gait belt Activity Tolerance: Patient tolerated  treatment well Patient left: in chair;with family/visitor present;with call bell/phone within reach Nurse Communication: Mobility status PT Visit Diagnosis: Other abnormalities of gait and mobility (R26.89);Muscle weakness (generalized) (M62.81)     Time: [id number] PT Time Calculation (min) (ACUTE ONLY): 12 min  Charges:  $Gait Training: 8-22 mins                     Carma LairKara Caydence Enck, [place]  Acute Rehab [phone]    Carma LairKara Paylin Hailu 08/27/2018, 9:55 AM

## 2018-08-27 NOTE — Progress Notes (Addendum)
   Subjective:  [name] is a 77 yo M w/ PMH of DM, HTN, CKD3, post nasal drip, OSA, HLD admit for CAP on admit day 2.  [name] was examined and evaluated at bedside this AM with daughter present. He appears more alert and awake compared to yesterday. He was able to participate in physical therapy yesterday walking down the hallway with mild desaturation. He states he had a good night of sleep and is looking forward to going home. Denies any F/N/V/D/C. Clarified that he had taken his doxy dose Monday, Tuesday and Wednesday before hospitalization this week with his daughter.   Objective:  Vital signs in last 24 hours: Vitals:   08/26/18 1137 08/26/18 1714 08/26/18 1947 08/27/18 0519  BP: 137/73 (!) 143/69 (!) 143/67 136/70  Pulse: 94 92 93 85  Resp: 18 18 16 16   Temp: 98.3 F (36.8 C) 98.6 F (37 C) 98 F (36.7 C) 98 F (36.7 C)  TempSrc: Oral Oral Oral Oral  SpO2: 94% 94% 95% 94%  Weight:    65.9 kg  Height:       Physical Exam  Constitutional: He is oriented to person, place, and time and well-developed, well-nourished, and in no distress. No distress.  Lying comfortably in bed  HENT:  Head: Normocephalic and atraumatic.  Mouth/Throat: Oropharynx is clear and moist. No oropharyngeal exudate.  Eyes: Pupils are equal, round, and reactive to light. Conjunctivae and EOM are normal. No scleral icterus.  Neck: Normal range of motion. Neck supple.  Cardiovascular: Normal rate, regular rhythm, normal heart sounds and intact distal pulses.  Pulmonary/Chest: Effort normal.  Distant breath sounds  Musculoskeletal: Normal range of motion. He exhibits no edema, tenderness or deformity.  Neurological: He is alert and oriented to person, place, and time. No cranial nerve deficit.  Skin: Skin is warm and dry. He is not diaphoretic.  Psychiatric: Mood, memory, affect and judgment normal.   Assessment/Plan:  Principal Problem:   Community acquired pneumonia Active Problems:  Acute prerenal azotemia  [name] 77 yo M w/ PMH of OSA, HLD, HTN, CKD3 admit for fatigue, weakness, decreased appetite 2/2 community acquired pneumonia  Malaise and Fatigue 2/2 CAP Appear much more alert this morning and family confirms improvement in fatigue. Able to tolerate PT although he desat to 89 while walking. Speech and swallow eval this morning show mild risk of aspiration which may have contributed to his respiratory findings. Stable for discharge this morning. - Blood Cultures negative in 2 days - Discharge today with home PT - Recommend finishing 7 day course of antibiotics with BID doxy at home on Sat and Sunday - C/w Protonix 40mg  daily  Acute on Chronic CKD 2/2 prerenal azotemia Improving on oral intake alone. Creatinine AM 1.55 (1.68 yesterday) - Resolved  Dispo: Anticipated discharge in approximately today(s).   Matthew Harrell, Matthew K, MD 08/27/2018, 9:52 AM Pager: [phone]

## 2018-08-27 NOTE — Progress Notes (Signed)
Internal Medicine Attending  Date: 08/27/2018  Patient name: Matthew Harrell Medical record number: [id number] Date of birth: 04/08/1941 Age: 77 y.o. Gender: male  I saw and evaluated the patient. I reviewed the resident's note by Dr. Nedra HaiLee and I agree with the resident's findings and plans as documented in his progress note.  When seen on rounds this morning Matthew Harrell was markedly improved from a mental status standpoint. He was interactive and there was no evidence of lethargy. His daughter was at the bedside at the time of her visit and states that he is back to his usual self. He is therefore safe for discharge home. We will convert to doxycycline for 2 additional days to complete the week of therapy for a presumed community-acquired pneumonia. He will have home health physical therapy and follow-up with his primary care provider in [place].

## 2018-08-29 DIAGNOSIS — E785 Hyperlipidemia, unspecified: Secondary | ICD-10-CM | POA: Diagnosis not present

## 2018-08-29 DIAGNOSIS — N183 Chronic kidney disease, stage 3 (moderate): Secondary | ICD-10-CM | POA: Diagnosis not present

## 2018-08-29 DIAGNOSIS — G4733 Obstructive sleep apnea (adult) (pediatric): Secondary | ICD-10-CM | POA: Diagnosis not present

## 2018-08-29 DIAGNOSIS — Z79891 Long term (current) use of opiate analgesic: Secondary | ICD-10-CM | POA: Diagnosis not present

## 2018-08-29 DIAGNOSIS — E1122 Type 2 diabetes mellitus with diabetic chronic kidney disease: Secondary | ICD-10-CM | POA: Diagnosis not present

## 2018-08-29 DIAGNOSIS — I129 Hypertensive chronic kidney disease with stage 1 through stage 4 chronic kidney disease, or unspecified chronic kidney disease: Secondary | ICD-10-CM | POA: Diagnosis not present

## 2018-08-29 DIAGNOSIS — J189 Pneumonia, unspecified organism: Secondary | ICD-10-CM | POA: Diagnosis not present

## 2018-08-29 DIAGNOSIS — Z791 Long term (current) use of non-steroidal anti-inflammatories (NSAID): Secondary | ICD-10-CM | POA: Diagnosis not present

## 2018-08-29 DIAGNOSIS — M6281 Muscle weakness (generalized): Secondary | ICD-10-CM | POA: Diagnosis not present

## 2018-08-29 DIAGNOSIS — Z7984 Long term (current) use of oral hypoglycemic drugs: Secondary | ICD-10-CM | POA: Diagnosis not present

## 2018-08-29 DIAGNOSIS — Z7982 Long term (current) use of aspirin: Secondary | ICD-10-CM | POA: Diagnosis not present

## 2018-08-30 LAB — CULTURE, BLOOD (ROUTINE X 2)
Culture: NO GROWTH
Culture: NO GROWTH
SPECIAL REQUESTS: ADEQUATE
Special Requests: ADEQUATE

## 2018-09-01 DIAGNOSIS — Z791 Long term (current) use of non-steroidal anti-inflammatories (NSAID): Secondary | ICD-10-CM | POA: Diagnosis not present

## 2018-09-01 DIAGNOSIS — M6281 Muscle weakness (generalized): Secondary | ICD-10-CM | POA: Diagnosis not present

## 2018-09-01 DIAGNOSIS — J189 Pneumonia, unspecified organism: Secondary | ICD-10-CM | POA: Diagnosis not present

## 2018-09-01 DIAGNOSIS — Z7984 Long term (current) use of oral hypoglycemic drugs: Secondary | ICD-10-CM | POA: Diagnosis not present

## 2018-09-01 DIAGNOSIS — N183 Chronic kidney disease, stage 3 (moderate): Secondary | ICD-10-CM | POA: Diagnosis not present

## 2018-09-01 DIAGNOSIS — E785 Hyperlipidemia, unspecified: Secondary | ICD-10-CM | POA: Diagnosis not present

## 2018-09-01 DIAGNOSIS — Z79891 Long term (current) use of opiate analgesic: Secondary | ICD-10-CM | POA: Diagnosis not present

## 2018-09-01 DIAGNOSIS — E1122 Type 2 diabetes mellitus with diabetic chronic kidney disease: Secondary | ICD-10-CM | POA: Diagnosis not present

## 2018-09-01 DIAGNOSIS — G4733 Obstructive sleep apnea (adult) (pediatric): Secondary | ICD-10-CM | POA: Diagnosis not present

## 2018-09-01 DIAGNOSIS — Z7982 Long term (current) use of aspirin: Secondary | ICD-10-CM | POA: Diagnosis not present

## 2018-09-01 DIAGNOSIS — I129 Hypertensive chronic kidney disease with stage 1 through stage 4 chronic kidney disease, or unspecified chronic kidney disease: Secondary | ICD-10-CM | POA: Diagnosis not present

## 2018-09-05 DIAGNOSIS — Z791 Long term (current) use of non-steroidal anti-inflammatories (NSAID): Secondary | ICD-10-CM | POA: Diagnosis not present

## 2018-09-05 DIAGNOSIS — E1122 Type 2 diabetes mellitus with diabetic chronic kidney disease: Secondary | ICD-10-CM | POA: Diagnosis not present

## 2018-09-05 DIAGNOSIS — Z7982 Long term (current) use of aspirin: Secondary | ICD-10-CM | POA: Diagnosis not present

## 2018-09-05 DIAGNOSIS — M6281 Muscle weakness (generalized): Secondary | ICD-10-CM | POA: Diagnosis not present

## 2018-09-05 DIAGNOSIS — I129 Hypertensive chronic kidney disease with stage 1 through stage 4 chronic kidney disease, or unspecified chronic kidney disease: Secondary | ICD-10-CM | POA: Diagnosis not present

## 2018-09-05 DIAGNOSIS — Z7984 Long term (current) use of oral hypoglycemic drugs: Secondary | ICD-10-CM | POA: Diagnosis not present

## 2018-09-05 DIAGNOSIS — Z79891 Long term (current) use of opiate analgesic: Secondary | ICD-10-CM | POA: Diagnosis not present

## 2018-09-05 DIAGNOSIS — N183 Chronic kidney disease, stage 3 (moderate): Secondary | ICD-10-CM | POA: Diagnosis not present

## 2018-09-05 DIAGNOSIS — G4733 Obstructive sleep apnea (adult) (pediatric): Secondary | ICD-10-CM | POA: Diagnosis not present

## 2018-09-05 DIAGNOSIS — E785 Hyperlipidemia, unspecified: Secondary | ICD-10-CM | POA: Diagnosis not present

## 2018-09-05 DIAGNOSIS — J189 Pneumonia, unspecified organism: Secondary | ICD-10-CM | POA: Diagnosis not present

## 2018-09-07 DIAGNOSIS — I10 Essential (primary) hypertension: Secondary | ICD-10-CM | POA: Diagnosis not present

## 2018-09-07 DIAGNOSIS — J189 Pneumonia, unspecified organism: Secondary | ICD-10-CM | POA: Diagnosis not present

## 2018-09-07 DIAGNOSIS — Z299 Encounter for prophylactic measures, unspecified: Secondary | ICD-10-CM | POA: Diagnosis not present

## 2018-09-07 DIAGNOSIS — E7800 Pure hypercholesterolemia, unspecified: Secondary | ICD-10-CM | POA: Diagnosis not present

## 2018-09-07 DIAGNOSIS — E1122 Type 2 diabetes mellitus with diabetic chronic kidney disease: Secondary | ICD-10-CM | POA: Diagnosis not present

## 2018-09-08 DIAGNOSIS — E1122 Type 2 diabetes mellitus with diabetic chronic kidney disease: Secondary | ICD-10-CM | POA: Diagnosis not present

## 2018-09-08 DIAGNOSIS — M6281 Muscle weakness (generalized): Secondary | ICD-10-CM | POA: Diagnosis not present

## 2018-09-08 DIAGNOSIS — J189 Pneumonia, unspecified organism: Secondary | ICD-10-CM | POA: Diagnosis not present

## 2018-09-08 DIAGNOSIS — Z7984 Long term (current) use of oral hypoglycemic drugs: Secondary | ICD-10-CM | POA: Diagnosis not present

## 2018-09-08 DIAGNOSIS — Z7982 Long term (current) use of aspirin: Secondary | ICD-10-CM | POA: Diagnosis not present

## 2018-09-08 DIAGNOSIS — Z79891 Long term (current) use of opiate analgesic: Secondary | ICD-10-CM | POA: Diagnosis not present

## 2018-09-08 DIAGNOSIS — I129 Hypertensive chronic kidney disease with stage 1 through stage 4 chronic kidney disease, or unspecified chronic kidney disease: Secondary | ICD-10-CM | POA: Diagnosis not present

## 2018-09-08 DIAGNOSIS — E785 Hyperlipidemia, unspecified: Secondary | ICD-10-CM | POA: Diagnosis not present

## 2018-09-08 DIAGNOSIS — N183 Chronic kidney disease, stage 3 (moderate): Secondary | ICD-10-CM | POA: Diagnosis not present

## 2018-09-08 DIAGNOSIS — G4733 Obstructive sleep apnea (adult) (pediatric): Secondary | ICD-10-CM | POA: Diagnosis not present

## 2018-09-08 DIAGNOSIS — Z791 Long term (current) use of non-steroidal anti-inflammatories (NSAID): Secondary | ICD-10-CM | POA: Diagnosis not present

## 2018-12-09 ENCOUNTER — Telehealth (HOSPITAL_COMMUNITY): Payer: Self-pay | Admitting: *Deleted

## 2018-12-09 NOTE — Telephone Encounter (Signed)
Left message for patient to call and schedule his renal u/s. EM

## 2018-12-20 ENCOUNTER — Other Ambulatory Visit (HOSPITAL_COMMUNITY): Payer: Self-pay | Admitting: Nephrology

## 2018-12-20 ENCOUNTER — Ambulatory Visit (HOSPITAL_COMMUNITY)
Admission: RE | Admit: 2018-12-20 | Discharge: 2018-12-20 | Disposition: A | Discharge status: Home / Self Care | Payer: Medicare Other | Source: Ambulatory Visit | Attending: Surgery | Admitting: Surgery

## 2018-12-20 DIAGNOSIS — I10 Essential (primary) hypertension: Secondary | ICD-10-CM | POA: Insufficient documentation

## 2019-07-14 ENCOUNTER — Telehealth: Payer: Self-pay

## 2019-07-14 DIAGNOSIS — I639 Cerebral infarction, unspecified: Secondary | ICD-10-CM

## 2019-07-14 NOTE — Telephone Encounter (Signed)
Received fax from Dr Merlene Laughter requesting 30 day event monitor for cva.Order placed, enrolled in [employer]

## 2019-07-31 IMAGING — DX DG LUMBAR SPINE COMPLETE 4+V
5 series · 5 of 5 positions shown · non-contrast
Comparison: Left hip radiographs obtained at the same time.

CLINICAL DATA: Bilateral leg pain when walking. No known injury.

EXAM:
LUMBAR SPINE - COMPLETE 4+ VIEW

[l-spine ap]
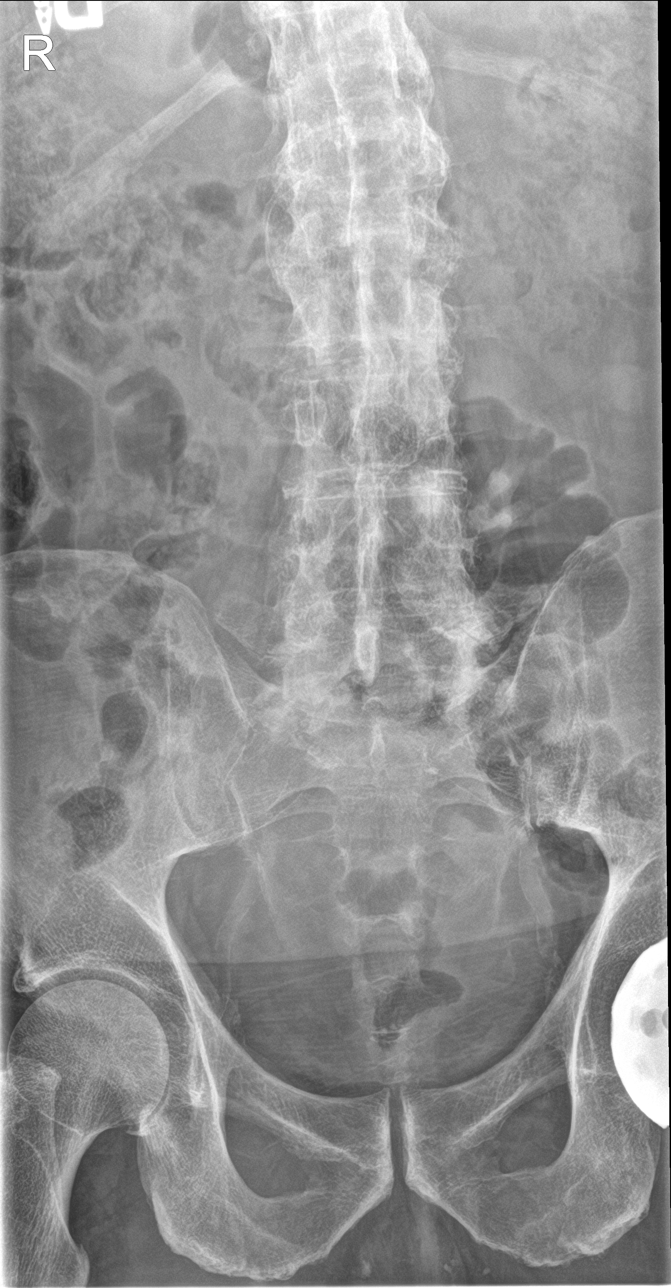

[l-spine obl (1 of 2)]
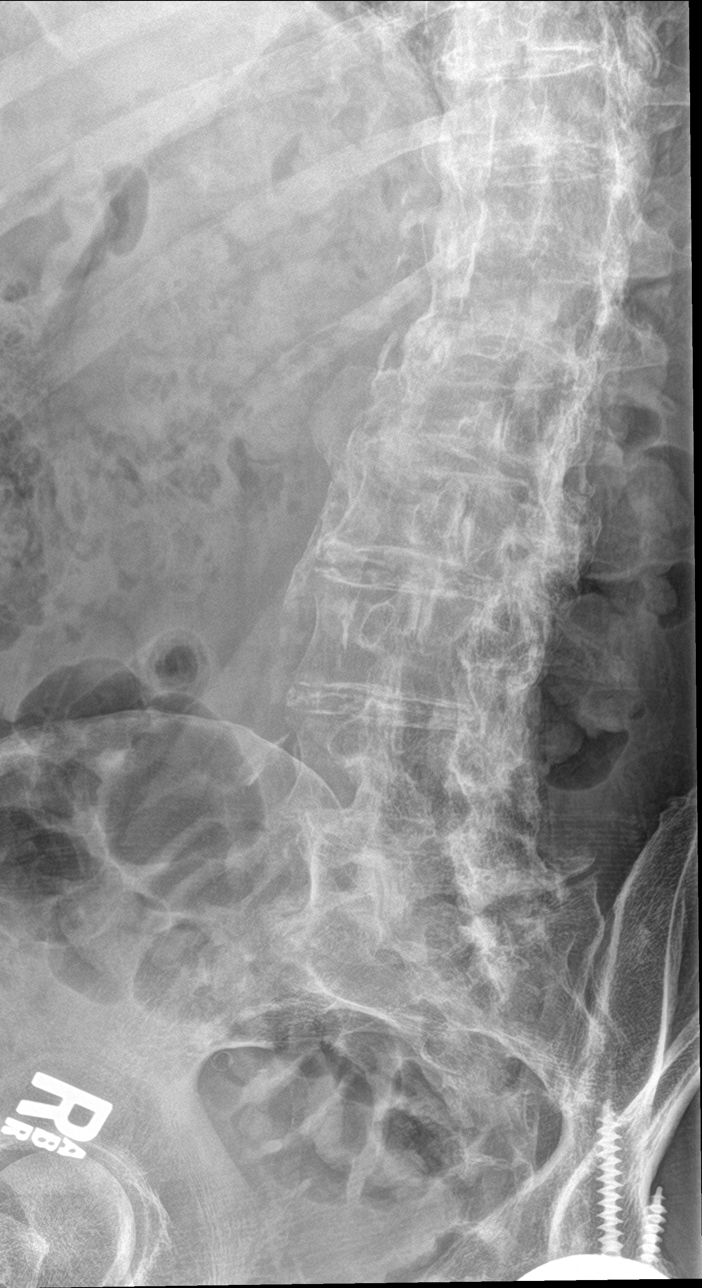

[l-spine obl (2 of 2)]
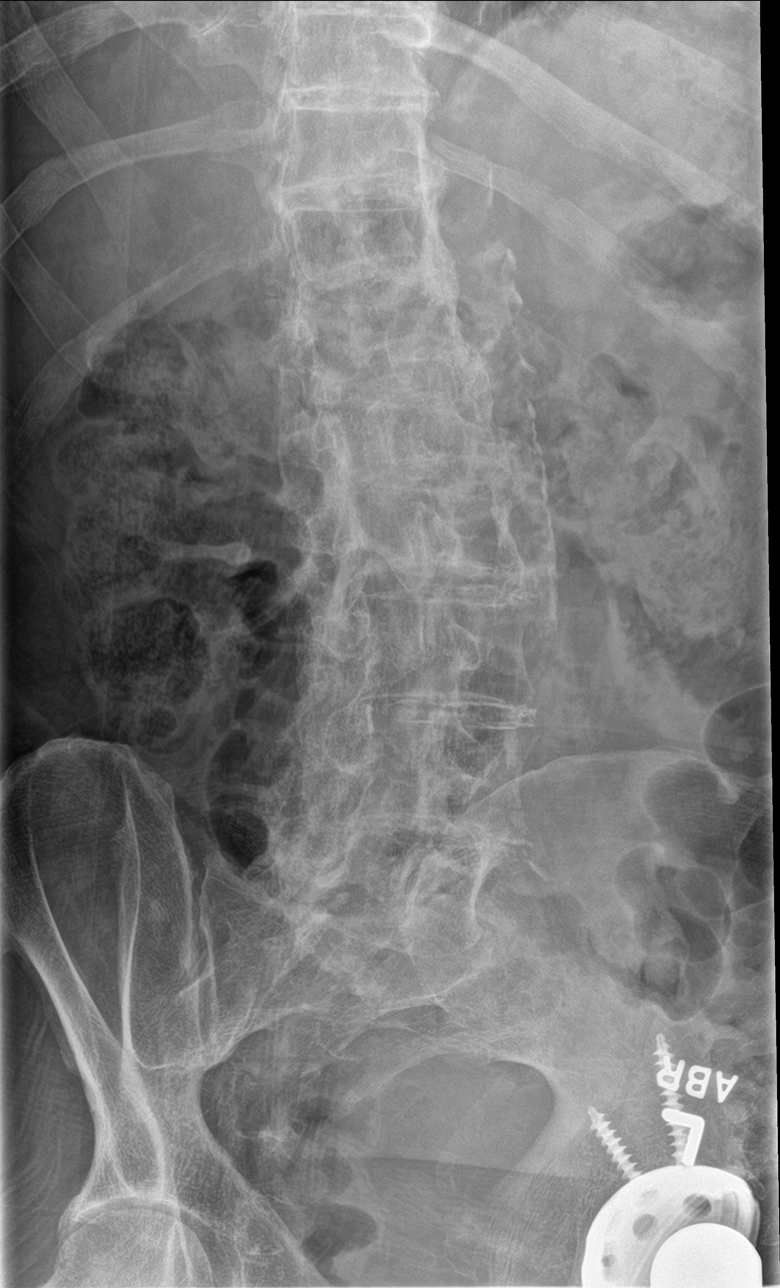

[l-spine lat]
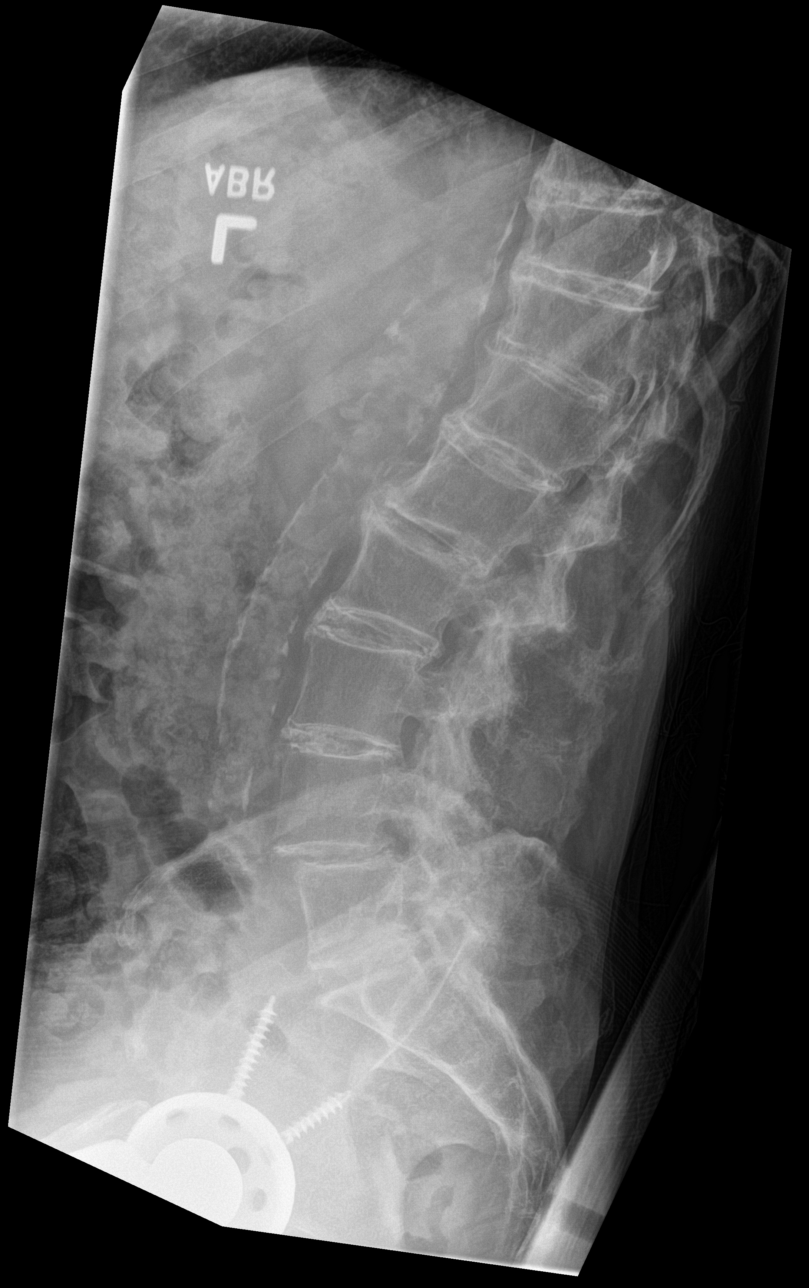

[l-spine spot]
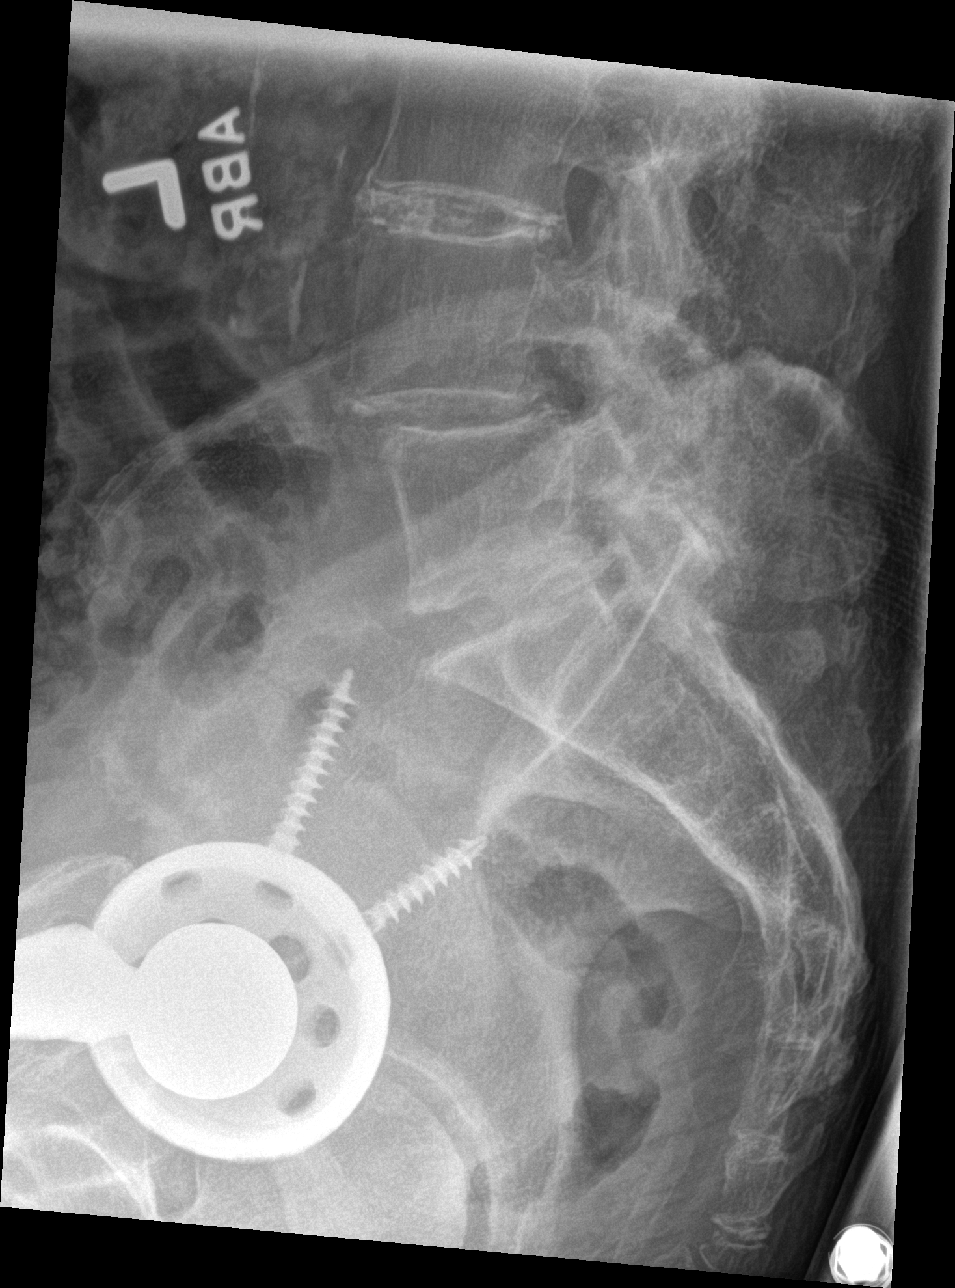

[5 of 5 positions shown; findings below may reference images not displayed]

FINDINGS: Previously described left hip prosthesis, partially included. 6
non-rib-bearing lumbar vertebrae. Mild anterior spur formation at
multiple levels. Facet degenerative changes at multiple levels.
There is associated grade 1 retrolisthesis in the upper lumbar spine
and grade 1 anterolisthesis in the lower lumbar spine. No fractures
or pars defects are seen. Extensive arterial calcifications are
noted.
IMPRESSION: 1. Degenerative changes, as described above.
2. Extensive atheromatous arterial calcifications.

## 2019-07-31 IMAGING — CT CT CHEST W/ CM
2 of 3 series · 15 of 36 positions shown, 18 images · IV contrast (omnipaque)
Comparison: Chest x-ray performed today

CLINICAL DATA: Abnormal chest x-ray.  Cough.

EXAM:
CT CHEST WITH CONTRAST
TECHNIQUE: Multidetector CT imaging of the chest was performed during
intravenous contrast administration.
CONTRAST:  60mL OMNIPAQUE IOHEXOL 300 MG/ML  SOLN

[Series 2: axial st · axial · 0.73mm/px · z∈[-326,-120]mm · 12 of 121 slices shown, 15 images]
[im 9/121  mediastinal]
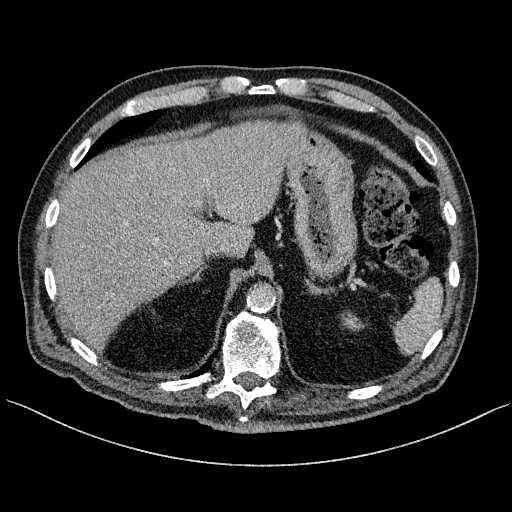
[im 9/121  lung]
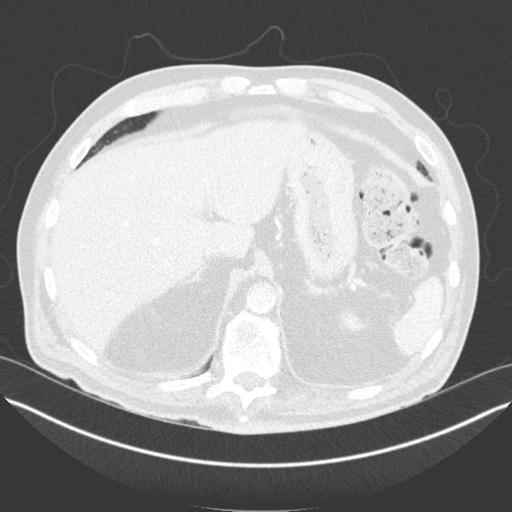
[im 18/121  lung]
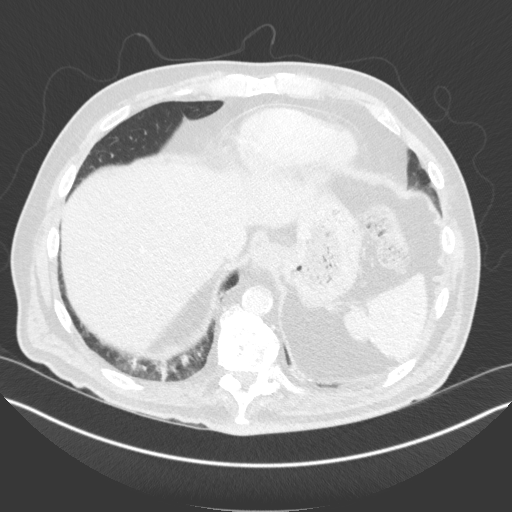
[im 27/121  lung]
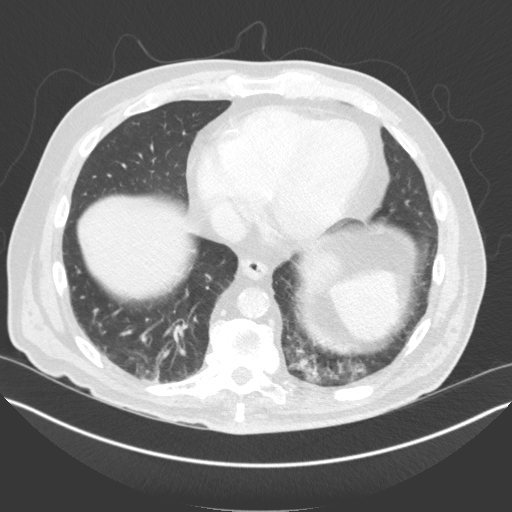
[im 36/121  lung]
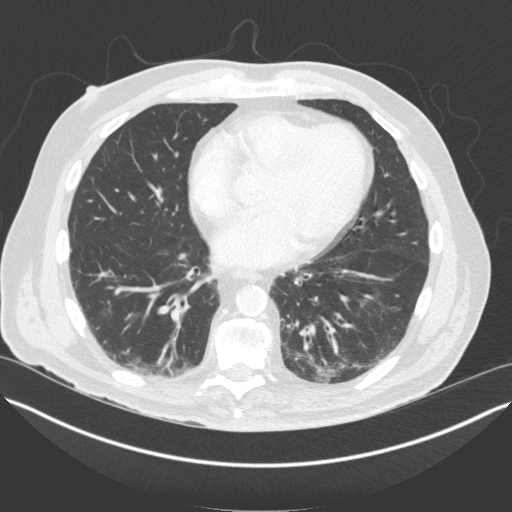
[im 45/121  mediastinal]
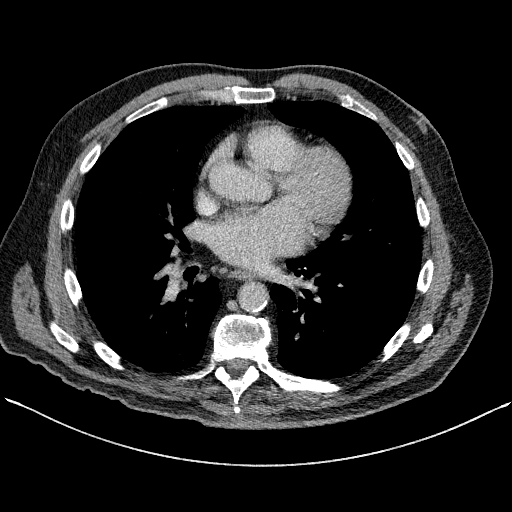
[im 45/121  lung]
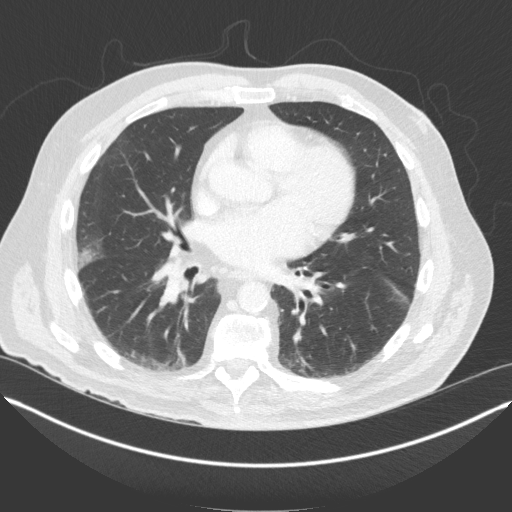
[im 54/121  lung]
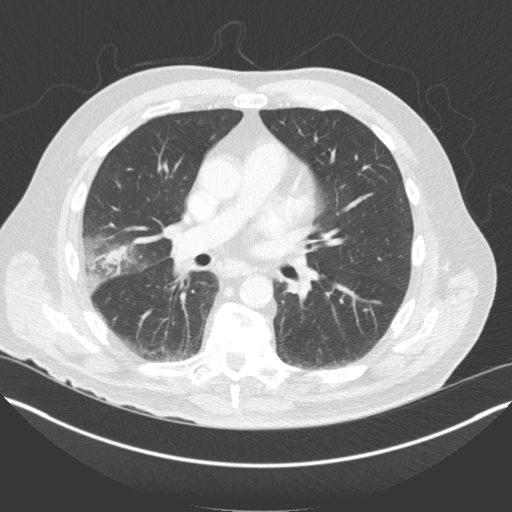
[im 67/121  lung]
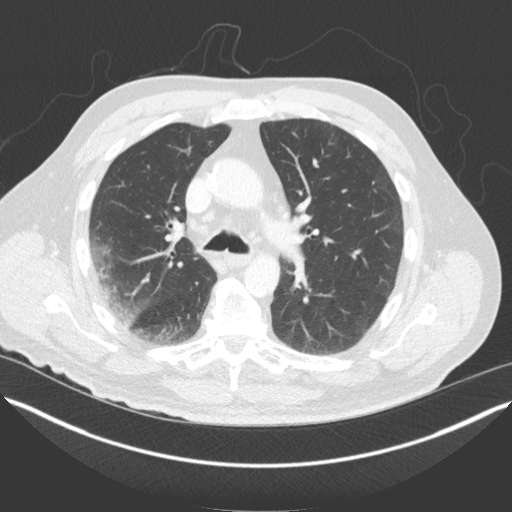
[im 76/121  lung]
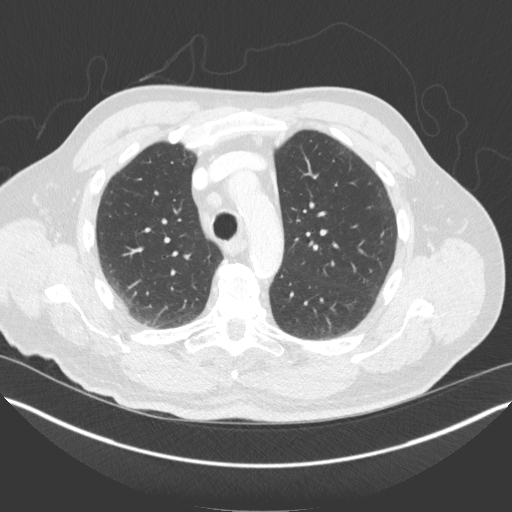
[im 85/121  mediastinal]
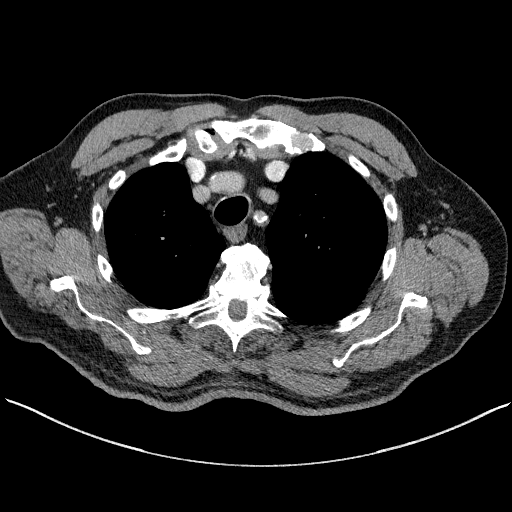
[im 85/121  lung]
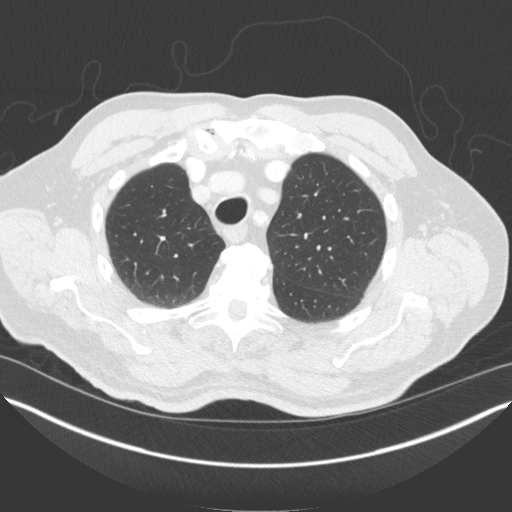
[im 94/121  lung]
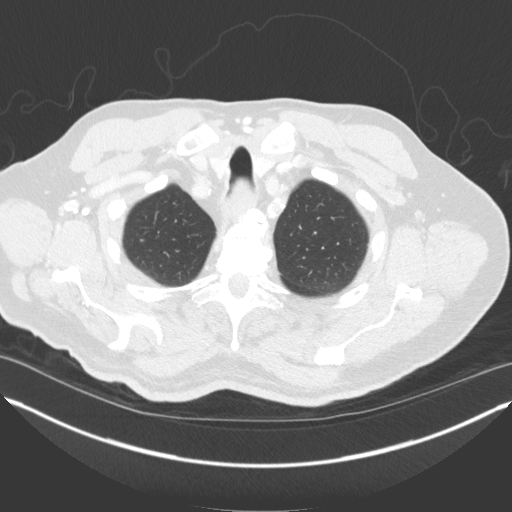
[im 103/121  lung]
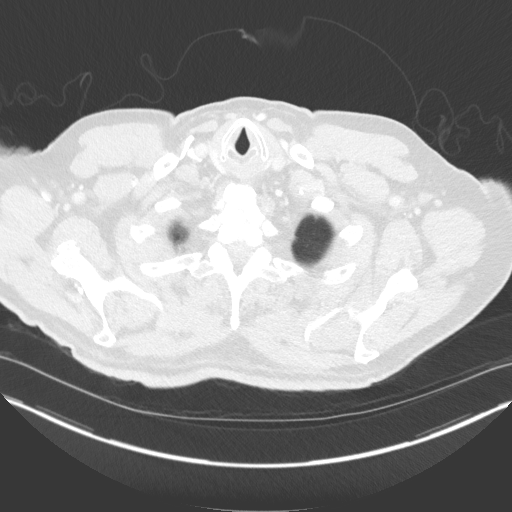
[im 112/121  lung]
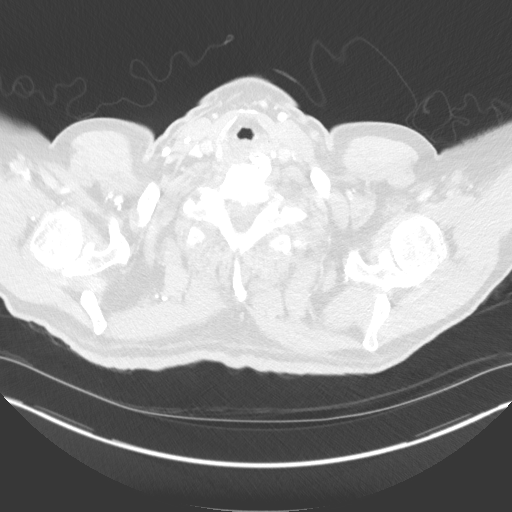

[Series 6: coronal · coronal · 0.51mm/px · 3 of 134 slices shown]
[im 27/134  lung]
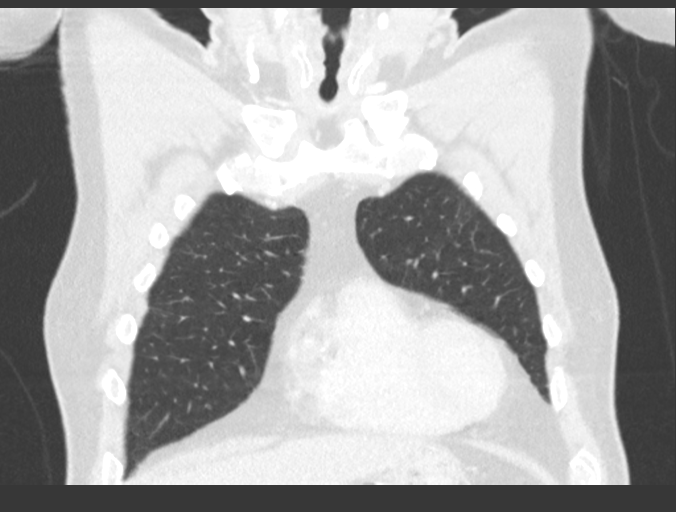
[im 54/134  lung]
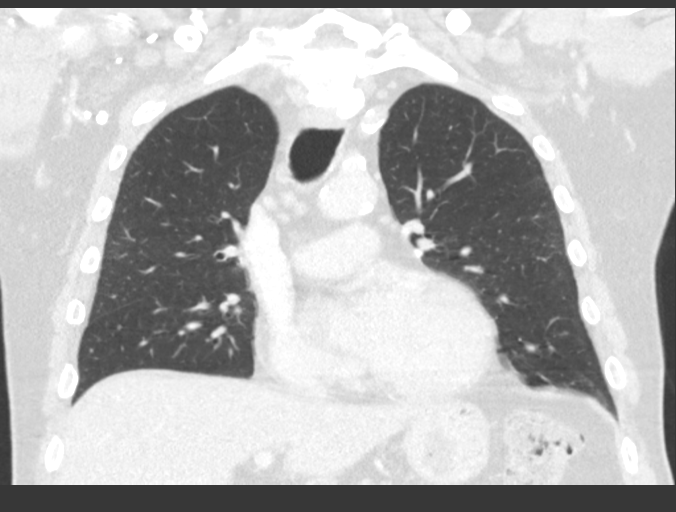
[im 80/134  lung]
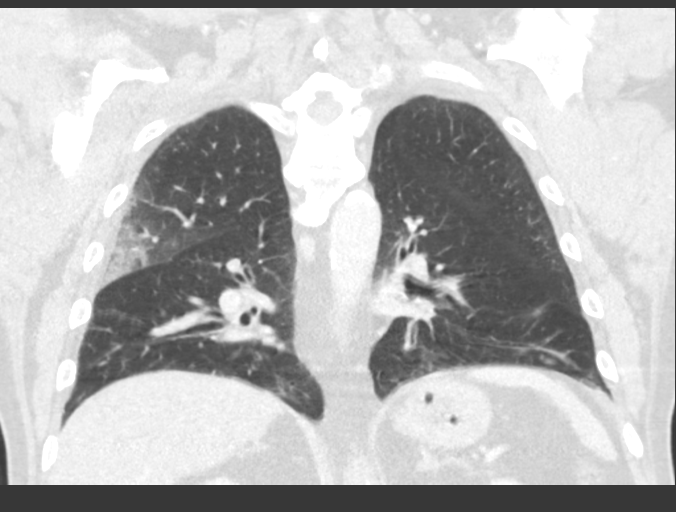

[15 of 36 positions shown; findings below may reference images not displayed]

FINDINGS: Cardiovascular: Coronary artery and aortic atherosclerosis. No
evidence of aneurysm. Heart is upper limits normal in size.

Mediastinum/Nodes: Borderline size scattered mediastinal lymph
nodes. Pretracheal lymph node has a short axis diameter of 9 mm.
Right paratracheal lymph node has a short axis diameter of 8 mm.
Other similarly sized scattered mediastinal lymph nodes. No hilar or
axillary adenopathy.

Lungs/Pleura: Airspace opacity in the right upper lobe concerning
for pneumonia. Bilateral lower lobe airspace opacities, left greater
than right could reflect atelectasis or pneumonia. No effusions.

Upper Abdomen: Imaging into the upper abdomen shows no acute
findings.

Musculoskeletal: Chest wall soft tissues are unremarkable. No acute
bony abnormality.
IMPRESSION: Airspace opacity in the posterior inferior right upper lobe, likely
pneumonia. Bilateral lower lobe atelectasis or pneumonia, left
greater than right. Recommend follow-up to resolution to exclude
underlying malignancy.

Borderline sized mediastinal lymph nodes, likely reactive.

Coronary artery disease.

Aortic Atherosclerosis ([id number]).

## 2019-08-02 IMAGING — DX DG CHEST 2V
2 series · 2 of 2 positions shown · non-contrast
Comparison: CT chest 08/23/2018, 11/17/2013. Chest x-ray
08/23/2018.

CLINICAL DATA: Acute onset shortness of breath.

EXAM:
CHEST - 2 VIEW

[chest pa]
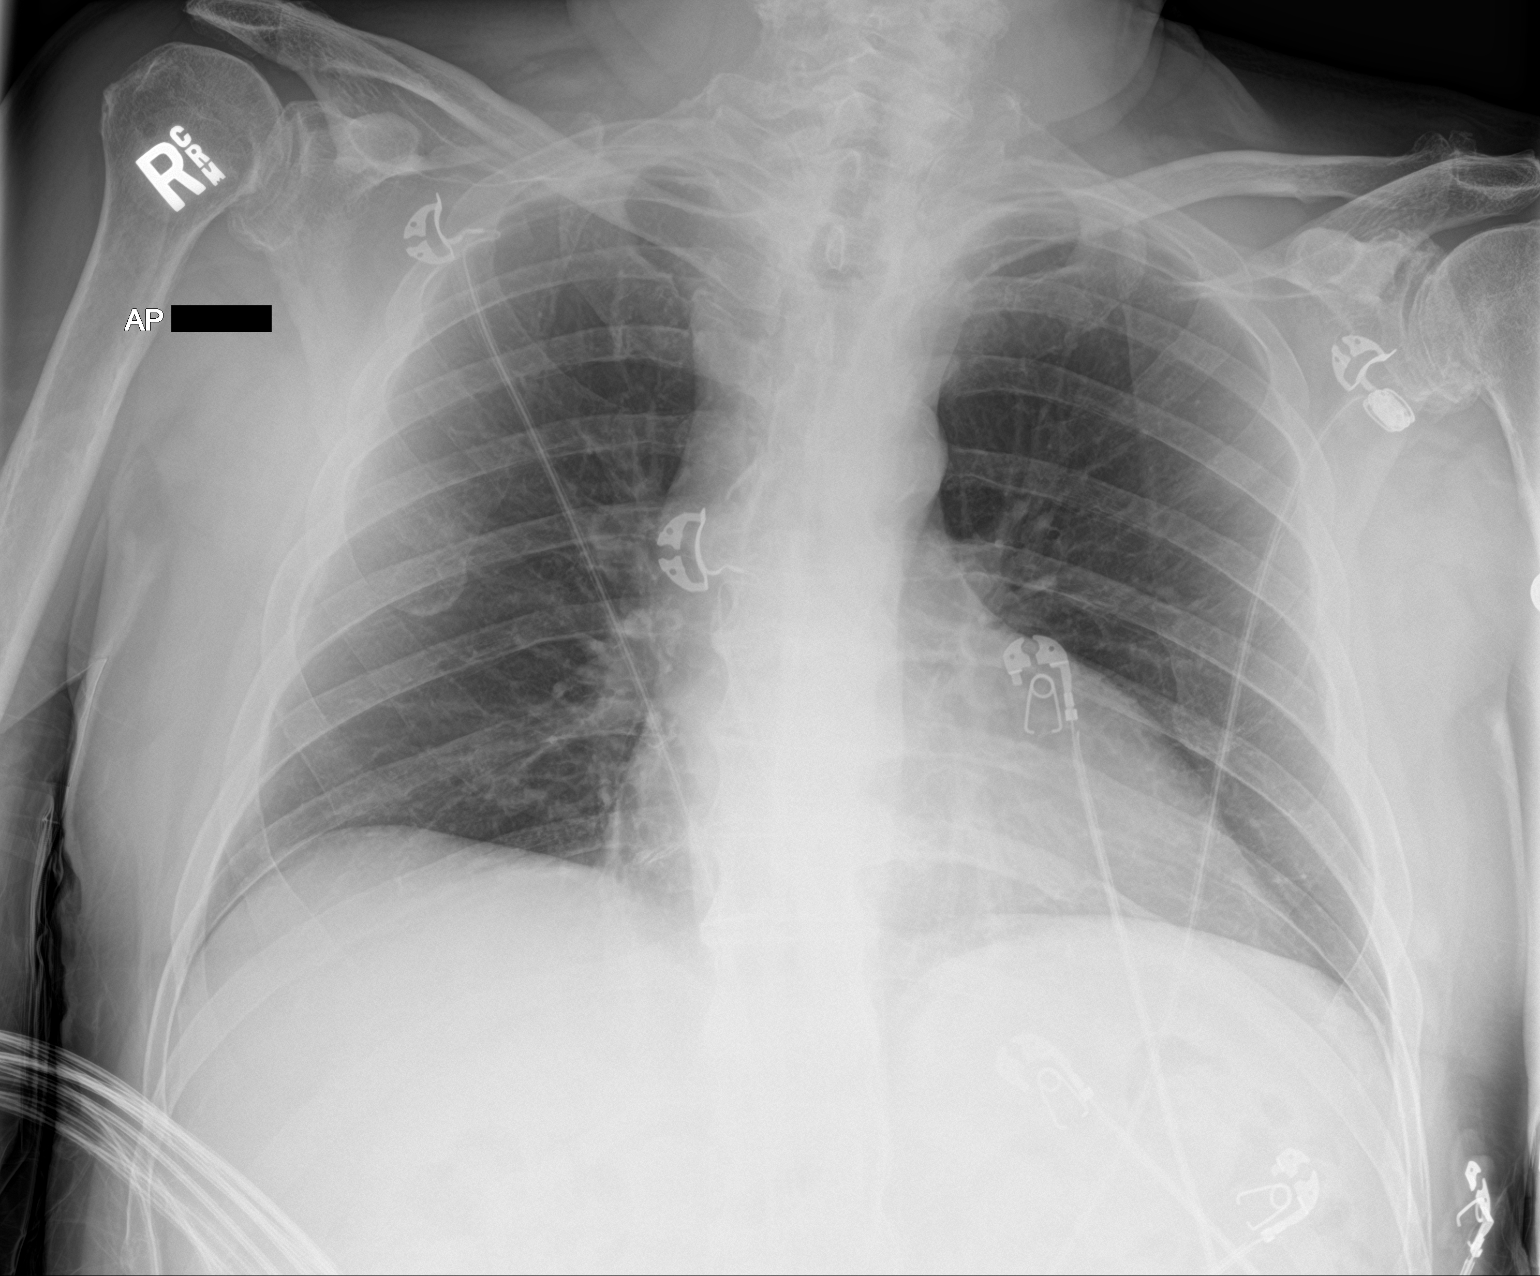

[chest lat]
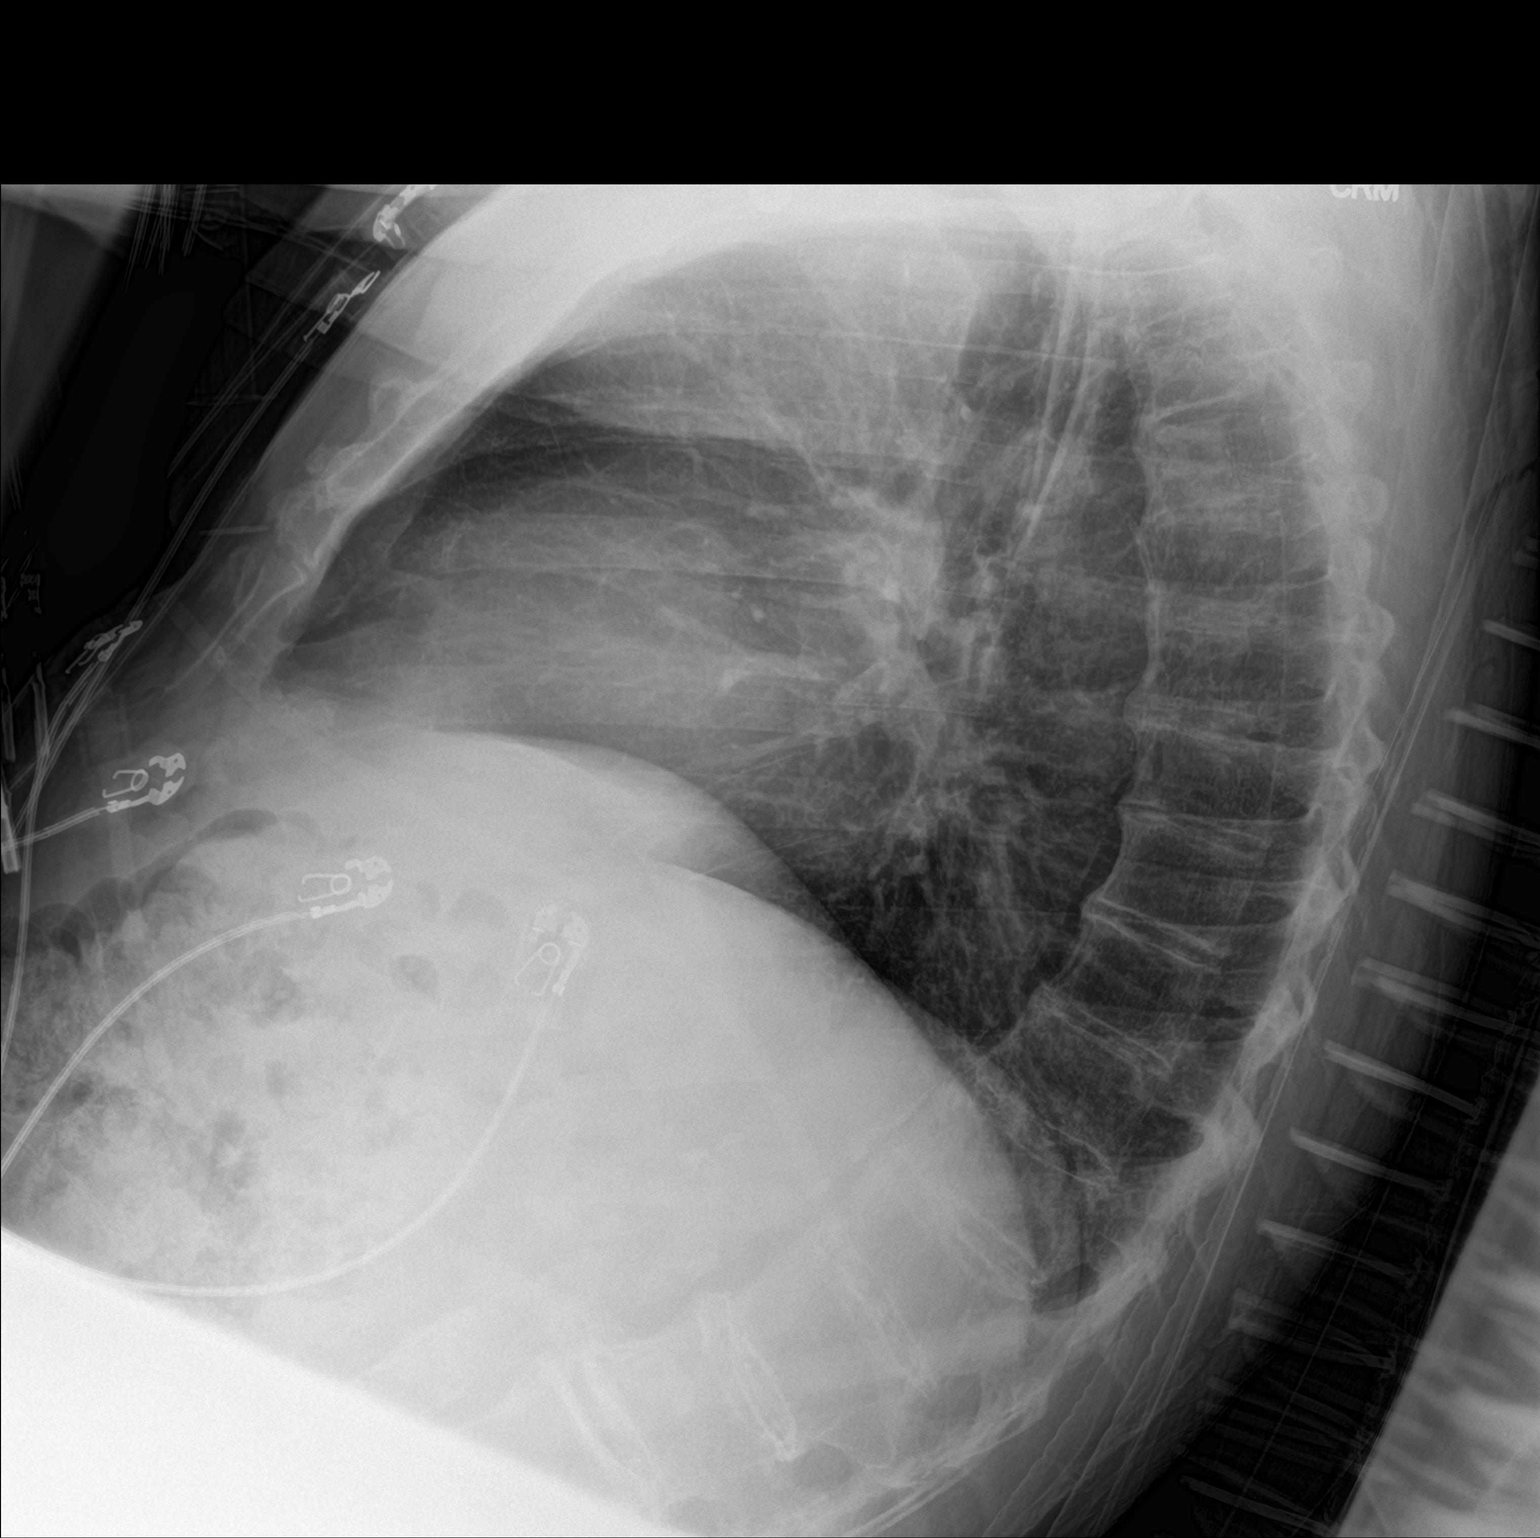

[2 of 2 positions shown; findings below may reference images not displayed]

FINDINGS: AP ERECT and LATERAL images were obtained. Interval resolution of
the airspace opacities in the INFERIOR RIGHT UPPER LOBE identified
on the examinations 2 days ago. Lungs now clear. Pulmonary
vascularity normal. No pleural effusions.

Cardiac silhouette mildly enlarged, slightly increased in size since
7679. Thoracic aorta atherosclerotic, unchanged. Hilar and
mediastinal contours otherwise unremarkable. Degenerative changes
and DISH involving the thoracic spine.
IMPRESSION: Resolution of RIGHT UPPER LOBE pneumonia since the examination 2
days ago. No acute cardiopulmonary disease currently.

## 2020-01-04 DIAGNOSIS — E1165 Type 2 diabetes mellitus with hyperglycemia: Secondary | ICD-10-CM | POA: Diagnosis not present

## 2020-01-04 DIAGNOSIS — J449 Chronic obstructive pulmonary disease, unspecified: Secondary | ICD-10-CM | POA: Diagnosis not present

## 2020-01-04 DIAGNOSIS — Z299 Encounter for prophylactic measures, unspecified: Secondary | ICD-10-CM | POA: Diagnosis not present

## 2020-01-04 DIAGNOSIS — I10 Essential (primary) hypertension: Secondary | ICD-10-CM | POA: Diagnosis not present

## 2020-01-04 DIAGNOSIS — R42 Dizziness and giddiness: Secondary | ICD-10-CM | POA: Diagnosis not present

## 2020-01-19 DIAGNOSIS — E1169 Type 2 diabetes mellitus with other specified complication: Secondary | ICD-10-CM | POA: Diagnosis not present

## 2020-01-19 DIAGNOSIS — E1165 Type 2 diabetes mellitus with hyperglycemia: Secondary | ICD-10-CM | POA: Diagnosis not present

## 2020-01-20 DIAGNOSIS — I639 Cerebral infarction, unspecified: Secondary | ICD-10-CM | POA: Diagnosis not present

## 2020-01-20 DIAGNOSIS — I10 Essential (primary) hypertension: Secondary | ICD-10-CM | POA: Diagnosis not present

## 2020-01-20 DIAGNOSIS — E7800 Pure hypercholesterolemia, unspecified: Secondary | ICD-10-CM | POA: Diagnosis not present

## 2020-02-17 DIAGNOSIS — I10 Essential (primary) hypertension: Secondary | ICD-10-CM | POA: Diagnosis not present

## 2020-02-17 DIAGNOSIS — I639 Cerebral infarction, unspecified: Secondary | ICD-10-CM | POA: Diagnosis not present

## 2020-02-17 DIAGNOSIS — E7800 Pure hypercholesterolemia, unspecified: Secondary | ICD-10-CM | POA: Diagnosis not present

## 2020-02-24 DIAGNOSIS — E1165 Type 2 diabetes mellitus with hyperglycemia: Secondary | ICD-10-CM | POA: Diagnosis not present

## 2020-02-24 DIAGNOSIS — E1169 Type 2 diabetes mellitus with other specified complication: Secondary | ICD-10-CM | POA: Diagnosis not present

## 2020-03-08 DIAGNOSIS — E1165 Type 2 diabetes mellitus with hyperglycemia: Secondary | ICD-10-CM | POA: Diagnosis not present

## 2020-03-08 DIAGNOSIS — I10 Essential (primary) hypertension: Secondary | ICD-10-CM | POA: Diagnosis not present

## 2020-03-08 DIAGNOSIS — J449 Chronic obstructive pulmonary disease, unspecified: Secondary | ICD-10-CM | POA: Diagnosis not present

## 2020-03-08 DIAGNOSIS — E7800 Pure hypercholesterolemia, unspecified: Secondary | ICD-10-CM | POA: Diagnosis not present

## 2020-03-08 DIAGNOSIS — K219 Gastro-esophageal reflux disease without esophagitis: Secondary | ICD-10-CM | POA: Diagnosis not present

## 2020-03-08 DIAGNOSIS — Z299 Encounter for prophylactic measures, unspecified: Secondary | ICD-10-CM | POA: Diagnosis not present

## 2020-03-14 DIAGNOSIS — E7800 Pure hypercholesterolemia, unspecified: Secondary | ICD-10-CM | POA: Diagnosis not present

## 2020-03-14 DIAGNOSIS — I10 Essential (primary) hypertension: Secondary | ICD-10-CM | POA: Diagnosis not present

## 2020-03-14 DIAGNOSIS — I639 Cerebral infarction, unspecified: Secondary | ICD-10-CM | POA: Diagnosis not present

## 2020-03-27 DIAGNOSIS — E1165 Type 2 diabetes mellitus with hyperglycemia: Secondary | ICD-10-CM | POA: Diagnosis not present

## 2020-03-27 DIAGNOSIS — E1169 Type 2 diabetes mellitus with other specified complication: Secondary | ICD-10-CM | POA: Diagnosis not present

## 2020-04-26 DIAGNOSIS — I639 Cerebral infarction, unspecified: Secondary | ICD-10-CM | POA: Diagnosis not present

## 2020-04-26 DIAGNOSIS — I10 Essential (primary) hypertension: Secondary | ICD-10-CM | POA: Diagnosis not present

## 2020-04-26 DIAGNOSIS — E7800 Pure hypercholesterolemia, unspecified: Secondary | ICD-10-CM | POA: Diagnosis not present

## 2020-04-27 DIAGNOSIS — E1169 Type 2 diabetes mellitus with other specified complication: Secondary | ICD-10-CM | POA: Diagnosis not present

## 2020-04-27 DIAGNOSIS — E1165 Type 2 diabetes mellitus with hyperglycemia: Secondary | ICD-10-CM | POA: Diagnosis not present

## 2020-05-07 DIAGNOSIS — M7062 Trochanteric bursitis, left hip: Secondary | ICD-10-CM | POA: Diagnosis not present

## 2020-05-07 DIAGNOSIS — T8484XA Pain due to internal orthopedic prosthetic devices, implants and grafts, initial encounter: Secondary | ICD-10-CM | POA: Diagnosis not present

## 2020-05-07 DIAGNOSIS — M25552 Pain in left hip: Secondary | ICD-10-CM | POA: Diagnosis not present

## 2020-05-14 ENCOUNTER — Other Ambulatory Visit (HOSPITAL_COMMUNITY): Payer: Self-pay | Admitting: Neurology

## 2020-05-14 ENCOUNTER — Other Ambulatory Visit: Payer: Self-pay | Admitting: Neurology

## 2020-05-14 DIAGNOSIS — R269 Unspecified abnormalities of gait and mobility: Secondary | ICD-10-CM

## 2020-05-14 DIAGNOSIS — S199XXS Unspecified injury of neck, sequela: Secondary | ICD-10-CM

## 2020-05-14 DIAGNOSIS — M4712 Other spondylosis with myelopathy, cervical region: Secondary | ICD-10-CM | POA: Diagnosis not present

## 2020-05-14 DIAGNOSIS — M130 Polyarthritis, unspecified: Secondary | ICD-10-CM | POA: Diagnosis not present

## 2020-05-14 DIAGNOSIS — E1142 Type 2 diabetes mellitus with diabetic polyneuropathy: Secondary | ICD-10-CM | POA: Diagnosis not present

## 2020-05-25 DIAGNOSIS — I10 Essential (primary) hypertension: Secondary | ICD-10-CM | POA: Diagnosis not present

## 2020-05-25 DIAGNOSIS — Z0000 Encounter for general adult medical examination without abnormal findings: Secondary | ICD-10-CM | POA: Diagnosis not present

## 2020-05-25 DIAGNOSIS — Z299 Encounter for prophylactic measures, unspecified: Secondary | ICD-10-CM | POA: Diagnosis not present

## 2020-05-25 DIAGNOSIS — E7800 Pure hypercholesterolemia, unspecified: Secondary | ICD-10-CM | POA: Diagnosis not present

## 2020-05-25 DIAGNOSIS — Z7189 Other specified counseling: Secondary | ICD-10-CM | POA: Diagnosis not present

## 2020-05-25 DIAGNOSIS — Z1211 Encounter for screening for malignant neoplasm of colon: Secondary | ICD-10-CM | POA: Diagnosis not present

## 2020-05-25 DIAGNOSIS — Z79899 Other long term (current) drug therapy: Secondary | ICD-10-CM | POA: Diagnosis not present

## 2020-05-25 DIAGNOSIS — R5383 Other fatigue: Secondary | ICD-10-CM | POA: Diagnosis not present

## 2020-05-27 DIAGNOSIS — E1165 Type 2 diabetes mellitus with hyperglycemia: Secondary | ICD-10-CM | POA: Diagnosis not present

## 2020-05-27 DIAGNOSIS — I639 Cerebral infarction, unspecified: Secondary | ICD-10-CM | POA: Diagnosis not present

## 2020-05-27 DIAGNOSIS — E1169 Type 2 diabetes mellitus with other specified complication: Secondary | ICD-10-CM | POA: Diagnosis not present

## 2020-05-27 DIAGNOSIS — I10 Essential (primary) hypertension: Secondary | ICD-10-CM | POA: Diagnosis not present

## 2020-05-27 DIAGNOSIS — E7800 Pure hypercholesterolemia, unspecified: Secondary | ICD-10-CM | POA: Diagnosis not present

## 2020-06-06 ENCOUNTER — Ambulatory Visit (HOSPITAL_COMMUNITY)
Admission: RE | Admit: 2020-06-06 | Discharge: 2020-06-06 | Disposition: A | Discharge status: Home / Self Care | Payer: Medicare Other | Source: Ambulatory Visit | Attending: Neurology | Admitting: Neurology

## 2020-06-06 ENCOUNTER — Other Ambulatory Visit: Payer: Self-pay

## 2020-06-06 DIAGNOSIS — R269 Unspecified abnormalities of gait and mobility: Secondary | ICD-10-CM | POA: Insufficient documentation

## 2020-06-06 DIAGNOSIS — M4712 Other spondylosis with myelopathy, cervical region: Secondary | ICD-10-CM | POA: Diagnosis not present

## 2020-06-06 DIAGNOSIS — R410 Disorientation, unspecified: Secondary | ICD-10-CM | POA: Diagnosis not present

## 2020-06-06 DIAGNOSIS — M4802 Spinal stenosis, cervical region: Secondary | ICD-10-CM | POA: Diagnosis not present

## 2020-06-18 DIAGNOSIS — N183 Chronic kidney disease, stage 3 unspecified: Secondary | ICD-10-CM | POA: Diagnosis not present

## 2020-06-18 DIAGNOSIS — Z299 Encounter for prophylactic measures, unspecified: Secondary | ICD-10-CM | POA: Diagnosis not present

## 2020-06-18 DIAGNOSIS — J449 Chronic obstructive pulmonary disease, unspecified: Secondary | ICD-10-CM | POA: Diagnosis not present

## 2020-06-18 DIAGNOSIS — I10 Essential (primary) hypertension: Secondary | ICD-10-CM | POA: Diagnosis not present

## 2020-06-18 DIAGNOSIS — E1122 Type 2 diabetes mellitus with diabetic chronic kidney disease: Secondary | ICD-10-CM | POA: Diagnosis not present

## 2020-06-18 DIAGNOSIS — E1165 Type 2 diabetes mellitus with hyperglycemia: Secondary | ICD-10-CM | POA: Diagnosis not present

## 2020-06-27 DIAGNOSIS — E1169 Type 2 diabetes mellitus with other specified complication: Secondary | ICD-10-CM | POA: Diagnosis not present

## 2020-06-27 DIAGNOSIS — I639 Cerebral infarction, unspecified: Secondary | ICD-10-CM | POA: Diagnosis not present

## 2020-06-27 DIAGNOSIS — E7800 Pure hypercholesterolemia, unspecified: Secondary | ICD-10-CM | POA: Diagnosis not present

## 2020-06-27 DIAGNOSIS — E1165 Type 2 diabetes mellitus with hyperglycemia: Secondary | ICD-10-CM | POA: Diagnosis not present

## 2020-06-27 DIAGNOSIS — I10 Essential (primary) hypertension: Secondary | ICD-10-CM | POA: Diagnosis not present

## 2020-07-11 DIAGNOSIS — M4712 Other spondylosis with myelopathy, cervical region: Secondary | ICD-10-CM | POA: Diagnosis not present

## 2020-07-11 DIAGNOSIS — R269 Unspecified abnormalities of gait and mobility: Secondary | ICD-10-CM | POA: Diagnosis not present

## 2020-07-11 DIAGNOSIS — E1142 Type 2 diabetes mellitus with diabetic polyneuropathy: Secondary | ICD-10-CM | POA: Diagnosis not present

## 2020-07-11 DIAGNOSIS — M130 Polyarthritis, unspecified: Secondary | ICD-10-CM | POA: Diagnosis not present

## 2020-07-11 DIAGNOSIS — S199XXS Unspecified injury of neck, sequela: Secondary | ICD-10-CM | POA: Diagnosis not present

## 2020-08-10 DIAGNOSIS — I10 Essential (primary) hypertension: Secondary | ICD-10-CM | POA: Diagnosis not present

## 2020-08-10 DIAGNOSIS — I639 Cerebral infarction, unspecified: Secondary | ICD-10-CM | POA: Diagnosis not present

## 2020-08-10 DIAGNOSIS — E7800 Pure hypercholesterolemia, unspecified: Secondary | ICD-10-CM | POA: Diagnosis not present

## 2020-09-11 DIAGNOSIS — I10 Essential (primary) hypertension: Secondary | ICD-10-CM | POA: Diagnosis not present

## 2020-09-11 DIAGNOSIS — G629 Polyneuropathy, unspecified: Secondary | ICD-10-CM | POA: Diagnosis not present

## 2020-09-11 DIAGNOSIS — E7800 Pure hypercholesterolemia, unspecified: Secondary | ICD-10-CM | POA: Diagnosis not present

## 2020-09-11 DIAGNOSIS — E559 Vitamin D deficiency, unspecified: Secondary | ICD-10-CM | POA: Diagnosis not present

## 2020-09-11 DIAGNOSIS — E1169 Type 2 diabetes mellitus with other specified complication: Secondary | ICD-10-CM | POA: Diagnosis not present

## 2020-09-11 DIAGNOSIS — K219 Gastro-esophageal reflux disease without esophagitis: Secondary | ICD-10-CM | POA: Diagnosis not present

## 2020-09-13 DIAGNOSIS — E782 Mixed hyperlipidemia: Secondary | ICD-10-CM | POA: Diagnosis not present

## 2020-09-18 DIAGNOSIS — K219 Gastro-esophageal reflux disease without esophagitis: Secondary | ICD-10-CM | POA: Diagnosis not present

## 2020-09-18 DIAGNOSIS — E7800 Pure hypercholesterolemia, unspecified: Secondary | ICD-10-CM | POA: Diagnosis not present

## 2020-09-18 DIAGNOSIS — E1169 Type 2 diabetes mellitus with other specified complication: Secondary | ICD-10-CM | POA: Diagnosis not present

## 2020-09-18 DIAGNOSIS — G629 Polyneuropathy, unspecified: Secondary | ICD-10-CM | POA: Diagnosis not present

## 2020-09-18 DIAGNOSIS — I10 Essential (primary) hypertension: Secondary | ICD-10-CM | POA: Diagnosis not present

## 2020-09-21 DIAGNOSIS — Z0001 Encounter for general adult medical examination with abnormal findings: Secondary | ICD-10-CM | POA: Diagnosis not present

## 2020-09-21 DIAGNOSIS — N189 Chronic kidney disease, unspecified: Secondary | ICD-10-CM | POA: Diagnosis not present

## 2020-09-21 DIAGNOSIS — E782 Mixed hyperlipidemia: Secondary | ICD-10-CM | POA: Diagnosis not present

## 2020-09-21 DIAGNOSIS — I129 Hypertensive chronic kidney disease with stage 1 through stage 4 chronic kidney disease, or unspecified chronic kidney disease: Secondary | ICD-10-CM | POA: Diagnosis not present

## 2020-09-21 DIAGNOSIS — E1169 Type 2 diabetes mellitus with other specified complication: Secondary | ICD-10-CM | POA: Diagnosis not present

## 2020-09-21 DIAGNOSIS — I10 Essential (primary) hypertension: Secondary | ICD-10-CM | POA: Diagnosis not present

## 2020-09-25 DIAGNOSIS — K219 Gastro-esophageal reflux disease without esophagitis: Secondary | ICD-10-CM | POA: Diagnosis not present

## 2020-09-25 DIAGNOSIS — E1169 Type 2 diabetes mellitus with other specified complication: Secondary | ICD-10-CM | POA: Diagnosis not present

## 2020-09-25 DIAGNOSIS — E7800 Pure hypercholesterolemia, unspecified: Secondary | ICD-10-CM | POA: Diagnosis not present

## 2020-09-25 DIAGNOSIS — G629 Polyneuropathy, unspecified: Secondary | ICD-10-CM | POA: Diagnosis not present

## 2020-09-25 DIAGNOSIS — I10 Essential (primary) hypertension: Secondary | ICD-10-CM | POA: Diagnosis not present

## 2020-09-25 DIAGNOSIS — Z23 Encounter for immunization: Secondary | ICD-10-CM | POA: Diagnosis not present

## 2020-10-09 DIAGNOSIS — E7800 Pure hypercholesterolemia, unspecified: Secondary | ICD-10-CM | POA: Diagnosis not present

## 2020-10-09 DIAGNOSIS — Z23 Encounter for immunization: Secondary | ICD-10-CM | POA: Diagnosis not present

## 2020-10-09 DIAGNOSIS — K219 Gastro-esophageal reflux disease without esophagitis: Secondary | ICD-10-CM | POA: Diagnosis not present

## 2020-10-09 DIAGNOSIS — I10 Essential (primary) hypertension: Secondary | ICD-10-CM | POA: Diagnosis not present

## 2020-10-09 DIAGNOSIS — E1169 Type 2 diabetes mellitus with other specified complication: Secondary | ICD-10-CM | POA: Diagnosis not present

## 2020-10-09 DIAGNOSIS — G629 Polyneuropathy, unspecified: Secondary | ICD-10-CM | POA: Diagnosis not present

## 2020-10-09 DIAGNOSIS — E875 Hyperkalemia: Secondary | ICD-10-CM | POA: Diagnosis not present

## 2020-10-29 DIAGNOSIS — S199XXS Unspecified injury of neck, sequela: Secondary | ICD-10-CM | POA: Diagnosis not present

## 2020-10-29 DIAGNOSIS — M130 Polyarthritis, unspecified: Secondary | ICD-10-CM | POA: Diagnosis not present

## 2020-10-29 DIAGNOSIS — E1142 Type 2 diabetes mellitus with diabetic polyneuropathy: Secondary | ICD-10-CM | POA: Diagnosis not present

## 2020-10-29 DIAGNOSIS — R269 Unspecified abnormalities of gait and mobility: Secondary | ICD-10-CM | POA: Diagnosis not present

## 2020-10-29 DIAGNOSIS — M4712 Other spondylosis with myelopathy, cervical region: Secondary | ICD-10-CM | POA: Diagnosis not present

## 2020-12-04 DIAGNOSIS — N1831 Chronic kidney disease, stage 3a: Secondary | ICD-10-CM | POA: Diagnosis not present

## 2020-12-04 DIAGNOSIS — I10 Essential (primary) hypertension: Secondary | ICD-10-CM | POA: Diagnosis not present

## 2020-12-04 DIAGNOSIS — K219 Gastro-esophageal reflux disease without esophagitis: Secondary | ICD-10-CM | POA: Diagnosis not present

## 2020-12-04 DIAGNOSIS — E875 Hyperkalemia: Secondary | ICD-10-CM | POA: Diagnosis not present

## 2020-12-04 DIAGNOSIS — E1169 Type 2 diabetes mellitus with other specified complication: Secondary | ICD-10-CM | POA: Diagnosis not present

## 2020-12-04 LAB — HEMOGLOBIN A1C: Hemoglobin A1C: 7.4

## 2020-12-07 DIAGNOSIS — I10 Essential (primary) hypertension: Secondary | ICD-10-CM | POA: Diagnosis not present

## 2020-12-07 DIAGNOSIS — K219 Gastro-esophageal reflux disease without esophagitis: Secondary | ICD-10-CM | POA: Diagnosis not present

## 2020-12-07 DIAGNOSIS — E875 Hyperkalemia: Secondary | ICD-10-CM | POA: Diagnosis not present

## 2020-12-07 DIAGNOSIS — R809 Proteinuria, unspecified: Secondary | ICD-10-CM | POA: Diagnosis not present

## 2020-12-07 DIAGNOSIS — G629 Polyneuropathy, unspecified: Secondary | ICD-10-CM | POA: Diagnosis not present

## 2020-12-07 DIAGNOSIS — E1169 Type 2 diabetes mellitus with other specified complication: Secondary | ICD-10-CM | POA: Diagnosis not present

## 2020-12-08 DIAGNOSIS — R300 Dysuria: Secondary | ICD-10-CM | POA: Diagnosis not present

## 2020-12-20 DIAGNOSIS — K219 Gastro-esophageal reflux disease without esophagitis: Secondary | ICD-10-CM | POA: Diagnosis not present

## 2020-12-20 DIAGNOSIS — G629 Polyneuropathy, unspecified: Secondary | ICD-10-CM | POA: Diagnosis not present

## 2020-12-20 DIAGNOSIS — I10 Essential (primary) hypertension: Secondary | ICD-10-CM | POA: Diagnosis not present

## 2020-12-20 DIAGNOSIS — E7800 Pure hypercholesterolemia, unspecified: Secondary | ICD-10-CM | POA: Diagnosis not present

## 2020-12-20 DIAGNOSIS — E1169 Type 2 diabetes mellitus with other specified complication: Secondary | ICD-10-CM | POA: Diagnosis not present

## 2020-12-28 DIAGNOSIS — I129 Hypertensive chronic kidney disease with stage 1 through stage 4 chronic kidney disease, or unspecified chronic kidney disease: Secondary | ICD-10-CM | POA: Diagnosis not present

## 2020-12-28 DIAGNOSIS — N1831 Chronic kidney disease, stage 3a: Secondary | ICD-10-CM | POA: Diagnosis not present

## 2020-12-28 DIAGNOSIS — E1122 Type 2 diabetes mellitus with diabetic chronic kidney disease: Secondary | ICD-10-CM | POA: Diagnosis not present

## 2021-01-10 DIAGNOSIS — Z23 Encounter for immunization: Secondary | ICD-10-CM | POA: Diagnosis not present

## 2021-01-21 DIAGNOSIS — I10 Essential (primary) hypertension: Secondary | ICD-10-CM | POA: Diagnosis not present

## 2021-01-21 DIAGNOSIS — R809 Proteinuria, unspecified: Secondary | ICD-10-CM | POA: Diagnosis not present

## 2021-01-21 DIAGNOSIS — E875 Hyperkalemia: Secondary | ICD-10-CM | POA: Diagnosis not present

## 2021-01-21 DIAGNOSIS — N1831 Chronic kidney disease, stage 3a: Secondary | ICD-10-CM | POA: Diagnosis not present

## 2021-01-21 DIAGNOSIS — K219 Gastro-esophageal reflux disease without esophagitis: Secondary | ICD-10-CM | POA: Diagnosis not present

## 2021-01-21 DIAGNOSIS — E7800 Pure hypercholesterolemia, unspecified: Secondary | ICD-10-CM | POA: Diagnosis not present

## 2021-01-21 DIAGNOSIS — E1169 Type 2 diabetes mellitus with other specified complication: Secondary | ICD-10-CM | POA: Diagnosis not present

## 2021-01-21 DIAGNOSIS — G629 Polyneuropathy, unspecified: Secondary | ICD-10-CM | POA: Diagnosis not present

## 2021-01-26 DIAGNOSIS — E1122 Type 2 diabetes mellitus with diabetic chronic kidney disease: Secondary | ICD-10-CM | POA: Diagnosis not present

## 2021-01-26 DIAGNOSIS — I129 Hypertensive chronic kidney disease with stage 1 through stage 4 chronic kidney disease, or unspecified chronic kidney disease: Secondary | ICD-10-CM | POA: Diagnosis not present

## 2021-01-26 DIAGNOSIS — N1831 Chronic kidney disease, stage 3a: Secondary | ICD-10-CM | POA: Diagnosis not present

## 2021-01-26 DIAGNOSIS — E7849 Other hyperlipidemia: Secondary | ICD-10-CM | POA: Diagnosis not present

## 2021-01-30 DIAGNOSIS — I129 Hypertensive chronic kidney disease with stage 1 through stage 4 chronic kidney disease, or unspecified chronic kidney disease: Secondary | ICD-10-CM | POA: Diagnosis not present

## 2021-01-30 DIAGNOSIS — D631 Anemia in chronic kidney disease: Secondary | ICD-10-CM | POA: Diagnosis not present

## 2021-01-30 DIAGNOSIS — E875 Hyperkalemia: Secondary | ICD-10-CM | POA: Diagnosis not present

## 2021-01-30 DIAGNOSIS — E1122 Type 2 diabetes mellitus with diabetic chronic kidney disease: Secondary | ICD-10-CM | POA: Diagnosis not present

## 2021-01-30 DIAGNOSIS — E211 Secondary hyperparathyroidism, not elsewhere classified: Secondary | ICD-10-CM | POA: Diagnosis not present

## 2021-01-30 DIAGNOSIS — N189 Chronic kidney disease, unspecified: Secondary | ICD-10-CM | POA: Diagnosis not present

## 2021-02-25 DIAGNOSIS — E7849 Other hyperlipidemia: Secondary | ICD-10-CM | POA: Diagnosis not present

## 2021-02-25 DIAGNOSIS — I129 Hypertensive chronic kidney disease with stage 1 through stage 4 chronic kidney disease, or unspecified chronic kidney disease: Secondary | ICD-10-CM | POA: Diagnosis not present

## 2021-02-25 DIAGNOSIS — E1122 Type 2 diabetes mellitus with diabetic chronic kidney disease: Secondary | ICD-10-CM | POA: Diagnosis not present

## 2021-02-25 DIAGNOSIS — N1831 Chronic kidney disease, stage 3a: Secondary | ICD-10-CM | POA: Diagnosis not present

## 2021-02-28 DIAGNOSIS — E1169 Type 2 diabetes mellitus with other specified complication: Secondary | ICD-10-CM | POA: Diagnosis not present

## 2021-02-28 DIAGNOSIS — G629 Polyneuropathy, unspecified: Secondary | ICD-10-CM | POA: Diagnosis not present

## 2021-02-28 DIAGNOSIS — R410 Disorientation, unspecified: Secondary | ICD-10-CM | POA: Diagnosis not present

## 2021-02-28 DIAGNOSIS — R809 Proteinuria, unspecified: Secondary | ICD-10-CM | POA: Diagnosis not present

## 2021-02-28 DIAGNOSIS — G459 Transient cerebral ischemic attack, unspecified: Secondary | ICD-10-CM | POA: Diagnosis not present

## 2021-02-28 DIAGNOSIS — G319 Degenerative disease of nervous system, unspecified: Secondary | ICD-10-CM | POA: Diagnosis not present

## 2021-02-28 DIAGNOSIS — Z87891 Personal history of nicotine dependence: Secondary | ICD-10-CM | POA: Diagnosis not present

## 2021-02-28 DIAGNOSIS — N1831 Chronic kidney disease, stage 3a: Secondary | ICD-10-CM | POA: Diagnosis not present

## 2021-02-28 DIAGNOSIS — N183 Chronic kidney disease, stage 3 unspecified: Secondary | ICD-10-CM | POA: Diagnosis not present

## 2021-02-28 DIAGNOSIS — E1122 Type 2 diabetes mellitus with diabetic chronic kidney disease: Secondary | ICD-10-CM | POA: Diagnosis not present

## 2021-02-28 DIAGNOSIS — E875 Hyperkalemia: Secondary | ICD-10-CM | POA: Diagnosis not present

## 2021-02-28 DIAGNOSIS — I10 Essential (primary) hypertension: Secondary | ICD-10-CM | POA: Diagnosis not present

## 2021-02-28 DIAGNOSIS — I129 Hypertensive chronic kidney disease with stage 1 through stage 4 chronic kidney disease, or unspecified chronic kidney disease: Secondary | ICD-10-CM | POA: Diagnosis not present

## 2021-03-01 DIAGNOSIS — J0100 Acute maxillary sinusitis, unspecified: Secondary | ICD-10-CM | POA: Diagnosis not present

## 2021-03-01 DIAGNOSIS — R410 Disorientation, unspecified: Secondary | ICD-10-CM | POA: Diagnosis not present

## 2021-03-01 DIAGNOSIS — J320 Chronic maxillary sinusitis: Secondary | ICD-10-CM | POA: Diagnosis not present

## 2021-03-01 DIAGNOSIS — I6782 Cerebral ischemia: Secondary | ICD-10-CM | POA: Diagnosis not present

## 2021-03-01 DIAGNOSIS — I6602 Occlusion and stenosis of left middle cerebral artery: Secondary | ICD-10-CM | POA: Diagnosis not present

## 2021-03-01 DIAGNOSIS — G319 Degenerative disease of nervous system, unspecified: Secondary | ICD-10-CM | POA: Diagnosis not present

## 2021-03-01 DIAGNOSIS — I672 Cerebral atherosclerosis: Secondary | ICD-10-CM | POA: Diagnosis not present

## 2021-03-01 DIAGNOSIS — Z8673 Personal history of transient ischemic attack (TIA), and cerebral infarction without residual deficits: Secondary | ICD-10-CM | POA: Diagnosis not present

## 2021-03-14 DIAGNOSIS — N1831 Chronic kidney disease, stage 3a: Secondary | ICD-10-CM | POA: Diagnosis not present

## 2021-03-14 DIAGNOSIS — E875 Hyperkalemia: Secondary | ICD-10-CM | POA: Diagnosis not present

## 2021-03-14 DIAGNOSIS — K219 Gastro-esophageal reflux disease without esophagitis: Secondary | ICD-10-CM | POA: Diagnosis not present

## 2021-03-14 DIAGNOSIS — G629 Polyneuropathy, unspecified: Secondary | ICD-10-CM | POA: Diagnosis not present

## 2021-03-14 DIAGNOSIS — I10 Essential (primary) hypertension: Secondary | ICD-10-CM | POA: Diagnosis not present

## 2021-03-14 DIAGNOSIS — R410 Disorientation, unspecified: Secondary | ICD-10-CM | POA: Diagnosis not present

## 2021-03-14 DIAGNOSIS — E1169 Type 2 diabetes mellitus with other specified complication: Secondary | ICD-10-CM | POA: Diagnosis not present

## 2021-03-19 DIAGNOSIS — D649 Anemia, unspecified: Secondary | ICD-10-CM | POA: Diagnosis not present

## 2021-03-19 DIAGNOSIS — E876 Hypokalemia: Secondary | ICD-10-CM | POA: Diagnosis not present

## 2021-03-19 DIAGNOSIS — E782 Mixed hyperlipidemia: Secondary | ICD-10-CM | POA: Diagnosis not present

## 2021-03-19 DIAGNOSIS — E7849 Other hyperlipidemia: Secondary | ICD-10-CM | POA: Diagnosis not present

## 2021-03-19 DIAGNOSIS — K219 Gastro-esophageal reflux disease without esophagitis: Secondary | ICD-10-CM | POA: Diagnosis not present

## 2021-03-19 DIAGNOSIS — E1169 Type 2 diabetes mellitus with other specified complication: Secondary | ICD-10-CM | POA: Diagnosis not present

## 2021-03-19 DIAGNOSIS — Z13220 Encounter for screening for lipoid disorders: Secondary | ICD-10-CM | POA: Diagnosis not present

## 2021-03-19 DIAGNOSIS — E875 Hyperkalemia: Secondary | ICD-10-CM | POA: Diagnosis not present

## 2021-03-19 DIAGNOSIS — Z130 Encounter for screening for diseases of the blood and blood-forming organs and certain disorders involving the immune mechanism: Secondary | ICD-10-CM | POA: Diagnosis not present

## 2021-03-19 DIAGNOSIS — E559 Vitamin D deficiency, unspecified: Secondary | ICD-10-CM | POA: Diagnosis not present

## 2021-03-20 LAB — HEMOGLOBIN A1C: Hemoglobin A1C: 7.9

## 2021-03-27 DIAGNOSIS — E7849 Other hyperlipidemia: Secondary | ICD-10-CM | POA: Diagnosis not present

## 2021-03-27 DIAGNOSIS — N1831 Chronic kidney disease, stage 3a: Secondary | ICD-10-CM | POA: Diagnosis not present

## 2021-03-27 DIAGNOSIS — E1122 Type 2 diabetes mellitus with diabetic chronic kidney disease: Secondary | ICD-10-CM | POA: Diagnosis not present

## 2021-03-27 DIAGNOSIS — I129 Hypertensive chronic kidney disease with stage 1 through stage 4 chronic kidney disease, or unspecified chronic kidney disease: Secondary | ICD-10-CM | POA: Diagnosis not present

## 2021-04-02 DIAGNOSIS — I6930 Unspecified sequelae of cerebral infarction: Secondary | ICD-10-CM | POA: Diagnosis not present

## 2021-04-02 DIAGNOSIS — M130 Polyarthritis, unspecified: Secondary | ICD-10-CM | POA: Diagnosis not present

## 2021-04-02 DIAGNOSIS — I69919 Unspecified symptoms and signs involving cognitive functions following unspecified cerebrovascular disease: Secondary | ICD-10-CM | POA: Diagnosis not present

## 2021-04-02 DIAGNOSIS — E1142 Type 2 diabetes mellitus with diabetic polyneuropathy: Secondary | ICD-10-CM | POA: Diagnosis not present

## 2021-04-02 DIAGNOSIS — Z79891 Long term (current) use of opiate analgesic: Secondary | ICD-10-CM | POA: Diagnosis not present

## 2021-04-02 DIAGNOSIS — R269 Unspecified abnormalities of gait and mobility: Secondary | ICD-10-CM | POA: Diagnosis not present

## 2021-04-03 DIAGNOSIS — D631 Anemia in chronic kidney disease: Secondary | ICD-10-CM | POA: Diagnosis not present

## 2021-04-03 DIAGNOSIS — N189 Chronic kidney disease, unspecified: Secondary | ICD-10-CM | POA: Diagnosis not present

## 2021-04-03 DIAGNOSIS — N170 Acute kidney failure with tubular necrosis: Secondary | ICD-10-CM | POA: Diagnosis not present

## 2021-04-03 DIAGNOSIS — E1122 Type 2 diabetes mellitus with diabetic chronic kidney disease: Secondary | ICD-10-CM | POA: Diagnosis not present

## 2021-04-03 DIAGNOSIS — I129 Hypertensive chronic kidney disease with stage 1 through stage 4 chronic kidney disease, or unspecified chronic kidney disease: Secondary | ICD-10-CM | POA: Diagnosis not present

## 2021-04-04 DIAGNOSIS — G629 Polyneuropathy, unspecified: Secondary | ICD-10-CM | POA: Diagnosis not present

## 2021-04-04 DIAGNOSIS — K219 Gastro-esophageal reflux disease without esophagitis: Secondary | ICD-10-CM | POA: Diagnosis not present

## 2021-04-04 DIAGNOSIS — E1169 Type 2 diabetes mellitus with other specified complication: Secondary | ICD-10-CM | POA: Diagnosis not present

## 2021-04-04 DIAGNOSIS — I10 Essential (primary) hypertension: Secondary | ICD-10-CM | POA: Diagnosis not present

## 2021-04-04 DIAGNOSIS — R410 Disorientation, unspecified: Secondary | ICD-10-CM | POA: Diagnosis not present

## 2021-04-04 DIAGNOSIS — N1831 Chronic kidney disease, stage 3a: Secondary | ICD-10-CM | POA: Diagnosis not present

## 2021-04-04 DIAGNOSIS — E875 Hyperkalemia: Secondary | ICD-10-CM | POA: Diagnosis not present

## 2021-04-12 LAB — BASIC METABOLIC PANEL
BUN: 34 — AB (ref 4–21)
Creatinine: 2 — AB (ref 0.6–1.3)

## 2021-04-12 LAB — CBC AND DIFFERENTIAL
HCT: 32 — AB (ref 41–53)
Hemoglobin: 10.3 — AB (ref 13.5–17.5)
WBC: 6.1

## 2021-04-12 LAB — HEMOGLOBIN A1C: Hemoglobin A1C: 8.1

## 2021-04-12 LAB — COMPREHENSIVE METABOLIC PANEL: GFR calc non Af Amer: 34

## 2021-04-12 LAB — CBC: RBC: 3.2 — AB (ref 3.87–5.11)

## 2021-04-16 DIAGNOSIS — E782 Mixed hyperlipidemia: Secondary | ICD-10-CM | POA: Diagnosis not present

## 2021-04-16 DIAGNOSIS — K219 Gastro-esophageal reflux disease without esophagitis: Secondary | ICD-10-CM | POA: Diagnosis not present

## 2021-04-16 DIAGNOSIS — I10 Essential (primary) hypertension: Secondary | ICD-10-CM | POA: Diagnosis not present

## 2021-04-16 DIAGNOSIS — N1831 Chronic kidney disease, stage 3a: Secondary | ICD-10-CM | POA: Diagnosis not present

## 2021-04-16 DIAGNOSIS — E7849 Other hyperlipidemia: Secondary | ICD-10-CM | POA: Diagnosis not present

## 2021-04-18 DIAGNOSIS — K219 Gastro-esophageal reflux disease without esophagitis: Secondary | ICD-10-CM | POA: Diagnosis not present

## 2021-04-18 DIAGNOSIS — I10 Essential (primary) hypertension: Secondary | ICD-10-CM | POA: Diagnosis not present

## 2021-04-18 DIAGNOSIS — R410 Disorientation, unspecified: Secondary | ICD-10-CM | POA: Diagnosis not present

## 2021-04-18 DIAGNOSIS — E1169 Type 2 diabetes mellitus with other specified complication: Secondary | ICD-10-CM | POA: Diagnosis not present

## 2021-04-18 DIAGNOSIS — N1831 Chronic kidney disease, stage 3a: Secondary | ICD-10-CM | POA: Diagnosis not present

## 2021-04-18 DIAGNOSIS — G629 Polyneuropathy, unspecified: Secondary | ICD-10-CM | POA: Diagnosis not present

## 2021-04-18 DIAGNOSIS — R809 Proteinuria, unspecified: Secondary | ICD-10-CM | POA: Diagnosis not present

## 2021-04-18 DIAGNOSIS — E875 Hyperkalemia: Secondary | ICD-10-CM | POA: Diagnosis not present

## 2021-04-24 ENCOUNTER — Ambulatory Visit: Payer: Medicare Other | Admitting: "Endocrinology

## 2021-04-24 ENCOUNTER — Encounter: Payer: Self-pay | Admitting: "Endocrinology

## 2021-04-24 ENCOUNTER — Other Ambulatory Visit: Payer: Self-pay

## 2021-04-24 VITALS — BP 126/54 | HR 84 | Ht 63.0 in | Wt 150.2 lb

## 2021-04-24 DIAGNOSIS — N1832 Chronic kidney disease, stage 3b: Secondary | ICD-10-CM | POA: Diagnosis not present

## 2021-04-24 DIAGNOSIS — E1122 Type 2 diabetes mellitus with diabetic chronic kidney disease: Secondary | ICD-10-CM | POA: Diagnosis not present

## 2021-04-24 MED ORDER — GLIPIZIDE ER 5 MG PO TB24: 5.0000 mg | ORAL_TABLET | Freq: Every day | ORAL | 0 refills | Status: DC

## 2021-04-24 MED ORDER — GLIPIZIDE ER 5 MG PO TB24: 5.0000 mg | ORAL_TABLET | Freq: Every day | ORAL | 1 refills | Status: DC

## 2021-04-24 NOTE — Patient Instructions (Signed)

## 2021-04-24 NOTE — Progress Notes (Signed)
Endocrinology Consult Note       04/24/2021, 4:46 PM   Subjective:    Patient ID: Matthew Harrell, male    DOB: [birth year].  Matthew Harrell is being seen in consultation for management of currently uncontrolled symptomatic diabetes requested by  Patient, No Pcp Per (Inactive).   Past Medical History:  Diagnosis Date  . Arthritis   . Diabetes (HCC)   . Diabetes mellitus, type II (HCC)   . Hypertension   . Kidney disease   . Stroke (cerebrum) [facility])     Past Surgical History:  Procedure Laterality Date  . NASAL SINUS SURGERY    . TOTAL HIP ARTHROPLASTY     Left    Social History   Socioeconomic History  . Marital status: Divorced    Spouse name: Not on file  . Number of children: 3  . Years of education: Not on file  . Highest education level: Not on file  Occupational History  . Occupation: retired  Tobacco Use  . Smoking status: Former Smoker    Packs/day: 1.00    Years: 10.00    Pack years: 10.00    Types: Cigarettes    Quit date: 12/29/1982    Years since quitting: 38.3  . Smokeless tobacco: Never Used  Substance and Sexual Activity  . Alcohol use: No  . Drug use: No  . Sexual activity: Not on file  Other Topics Concern  . Not on file  Social History Narrative   Lives alone.  Retired Designer, fashion/clothing.     Social Determinants of Health   Financial Resource Strain: Not on file  Food Insecurity: Not on file  Transportation Needs: Not on file  Physical Activity: Not on file  Stress: Not on file  Social Connections: Not on file    Family History  Problem Relation Age of Onset  . Diabetes Brother   . Stomach cancer Sister   . Aneurysm Mother 74  . Heart attack Father 67    Outpatient Encounter Medications as of 04/24/2021  Medication Sig  . Acetaminophen 500 MG capsule Take by mouth as needed for fever.  . [DISCONTINUED] glipiZIDE (GLUCOTROL XL) 5 MG 24 hr tablet Take 1 tablet  (5 mg total) by mouth daily with breakfast.  . [DISCONTINUED] Semaglutide,0.25 or 0.5MG /DOS, (OZEMPIC, 0.25 OR 0.5 MG/DOSE,) 2 MG/1.5ML SOPN Inject 0.5 mg into the skin once a week.  Marland Kitchen albuterol (PROVENTIL HFA;VENTOLIN HFA) 108 (90 Base) MCG/ACT inhaler Inhale 1-2 puffs into the lungs every 6 (six) hours as needed for wheezing or shortness of breath.  Marland Kitchen amLODipine (NORVASC) 5 MG tablet Take 5 mg by mouth daily.  Marland Kitchen aspirin 81 MG tablet Take 1 tablet (81 mg total) by mouth daily. (Patient not taking: Reported on 04/24/2021)  . atorvastatin (LIPITOR) 20 MG tablet Take 40 mg by mouth daily.  . clopidogrel (PLAVIX) 75 MG tablet Take 1 tablet by mouth daily.  . cyanocobalamin 1000 MCG tablet Take 1 tablet by mouth daily.  . diclofenac sodium (VOLTAREN) 1 % GEL Apply 2 g topically 4 (four) times daily as needed (pain).   . DULoxetine (CYMBALTA) 30 MG capsule Take  30 mg by mouth every morning.  . dutasteride (AVODART) 0.5 MG capsule Take 0.5 mg by mouth every morning.  . fluticasone (FLONASE) 50 MCG/ACT nasal spray Place 2 sprays into both nostrils daily.  Marland Kitchen glipiZIDE (GLUCOTROL XL) 5 MG 24 hr tablet Take 1 tablet (5 mg total) by mouth daily with breakfast.  . HYDROcodone-acetaminophen (NORCO/VICODIN) 5-325 MG tablet Take 1 tablet by mouth every 6 (six) hours as needed for moderate pain. (Patient not taking: Reported on 04/24/2021)  . loratadine (CLARITIN) 10 MG tablet Take 10 mg by mouth daily.  . montelukast (SINGULAIR) 10 MG tablet Take 1 tablet (10 mg total) by mouth at bedtime. (Patient not taking: Reported on 04/24/2021)  . Multiple Vitamins-Minerals (CENTRUM ADULTS) TABS Take 1 tablet by mouth daily in the afternoon. (Patient not taking: Reported on 04/24/2021)  . omeprazole (PRILOSEC) 20 MG capsule Take 1 capsule (20 mg total) by mouth daily.  . tamsulosin (FLOMAX) 0.4 MG CAPS capsule Take 0.4 mg by mouth every morning.  . traMADol (ULTRAM) 50 MG tablet Take 100 mg by mouth 3 (three) times daily as  needed.  . [DISCONTINUED] empagliflozin (JARDIANCE) 10 MG TABS tablet Take 1 tablet by mouth daily. (Patient not taking: Reported on 04/24/2021)  . [DISCONTINUED] glimepiride (AMARYL) 4 MG tablet Take 1 tablet (4 mg total) by mouth 2 (two) times daily. (Patient not taking: Reported on 04/24/2021)  . [DISCONTINUED] metFORMIN (GLUCOPHAGE) 500 MG tablet Take 1 tablet (500 mg total) by mouth 2 (two) times daily with a meal. (Patient not taking: Reported on 04/24/2021)   No facility-administered encounter medications on file as of 04/24/2021.    ALLERGIES: No Known Allergies  VACCINATION STATUS: Immunization History  Administered Date(s) Administered  . Influenza Split 09/28/2013  . Pneumococcal Polysaccharide-23 12/30/2007    Diabetes He presents for his initial diabetic visit. He has type 2 diabetes mellitus. Onset time: He was diagnosed at approximate age of 40 years. His disease course has been worsening. There are no hypoglycemic associated symptoms. Pertinent negatives for hypoglycemia include no confusion, headaches, pallor or seizures. Associated symptoms include weight loss. Pertinent negatives for diabetes include no chest pain, no fatigue, no polydipsia, no polyphagia, no polyuria and no weakness. There are no hypoglycemic complications. Symptoms are worsening. Diabetic complications include a CVA and nephropathy. Risk factors for coronary artery disease include diabetes mellitus, dyslipidemia, family history, male sex, hypertension, sedentary lifestyle and tobacco exposure. Current diabetic treatments: He is currently on Ozempic 0.5 mg subcutaneous weekly.  He was recently taken off of Jardiance and metformin due to CKD. His home blood glucose trend is fluctuating minimally. (He did not bring his logs nor meter.  His recent A1c was found to be 8%.)  Hyperlipidemia Pertinent negatives include no chest pain, myalgias or shortness of breath.  Hypertension Pertinent negatives include no chest  pain, headaches, neck pain, palpitations or shortness of breath. Hypertensive end-organ damage includes CVA.     Review of Systems  Constitutional: Positive for weight loss. Negative for chills, fatigue, fever and unexpected weight change.  HENT: Negative for dental problem, mouth sores and trouble swallowing.   Eyes: Negative for visual disturbance.  Respiratory: Negative for cough, choking, chest tightness, shortness of breath and wheezing.   Cardiovascular: Negative for chest pain, palpitations and leg swelling.  Gastrointestinal: Negative for abdominal distention, abdominal pain, constipation, diarrhea, nausea and vomiting.  Endocrine: Negative for polydipsia, polyphagia and polyuria.  Genitourinary: Negative for dysuria, flank pain, hematuria and urgency.  Musculoskeletal: Negative for back pain,  gait problem, myalgias and neck pain.  Skin: Negative for pallor, rash and wound.  Neurological: Negative for seizures, syncope, weakness, numbness and headaches.  Psychiatric/Behavioral: Negative for confusion and dysphoric mood.    Objective:    Vitals with BMI 04/24/2021 08/27/2018 08/26/2018  Height 5\' 3"  - -  Weight 150 lbs 3 oz 145 lbs 5 oz -  BMI 26.61 27.47 -  Systolic 126 136  Diastolic 54 70 67  Pulse 84 85 93    BP (!) 126/54   Pulse 84   Ht 5\' 3"  (1.6 m)   Wt 150 lb 3.2 oz (68.1 kg)   BMI 26.61 kg/m   Wt Readings from Last 3 Encounters:  04/24/21 150 lb 3.2 oz (68.1 kg)  08/27/18 145 lb 4.8 oz (65.9 kg)  08/23/18 156 lb (70.8 kg)     Physical Exam Constitutional:      General: He is not in acute distress.    Appearance: He is well-developed.  HENT:     Head: Normocephalic and atraumatic.  Neck:     Thyroid: No thyromegaly.     Trachea: No tracheal deviation.  Cardiovascular:     Rate and Rhythm: Normal rate.     Pulses:          Dorsalis pedis pulses are 1+ on the right side and 1+ on the left side.       Posterior tibial pulses are 1+ on the right  side and 1+ on the left side.     Heart sounds: Normal heart sounds, S1 normal and S2 normal. No murmur heard. No gallop.   Pulmonary:     Effort: Pulmonary effort is normal. No respiratory distress.     Breath sounds: No wheezing.  Abdominal:     General: Abdomen is flat. There is no distension.     Palpations: Abdomen is soft.     Tenderness: There is no abdominal tenderness. There is no guarding.  Musculoskeletal:     Right shoulder: No swelling or deformity.     Cervical back: Normal range of motion and neck supple.     Comments: Normal monofilament test on bilateral lower extremities.  Skin:    General: Skin is warm and dry.     Findings: No rash.     Nails: There is no clubbing.  Neurological:     Mental Status: He is alert and oriented to person, place, and time.     Cranial Nerves: No cranial nerve deficit.     Sensory: No sensory deficit.     Gait: Gait normal.     Deep Tendon Reflexes: Reflexes are normal and symmetric.  Psychiatric:        Speech: Speech normal.        Behavior: Behavior normal. Behavior is cooperative.        Thought Content: Thought content normal.        Judgment: Judgment normal.       CMP ( most recent) CMP     Component Value Date/Time   NA 137 08/27/2018 0418   K 4.7 08/27/2018 0418   CL 107 08/27/2018 0418   CO2 23 08/27/2018 0418   GLUCOSE 102 (H) 08/27/2018 0418   BUN 34 (A) 04/12/2021 0000   CREATININE 2.0 (A) 04/12/2021 0000   CREATININE 1.55 (H) 08/27/2018 0418   CALCIUM 8.9 08/27/2018 0418   PROT 8.2 (H) 08/23/2018 2025   ALBUMIN 4.3 08/23/2018 2025   AST 18 08/23/2018 2025   ALT  8 08/23/2018 2025   ALKPHOS 83 08/23/2018 2025   BILITOT 0.9 08/23/2018 2025   GFRNONAA 34 04/12/2021 0000   GFRAA 48 (L) 08/27/2018 0418     Diabetic Labs (most recent): Lab Results  Component Value Date   HGBA1C 8.1 04/12/2021   HGBA1C 7.9 03/20/2021   HGBA1C 7.4 12/04/2020     Assessment & Plan:   1. Type 2 diabetes mellitus  with stage 3b chronic kidney disease, without long-term current use of insulin (HCC)  - Matthew Harrell has currently uncontrolled symptomatic type 2 DM since  80 years of age,  with most recent A1c of 8.1 %. Recent labs reviewed. - I had a long discussion with him about the progressive nature of diabetes and the pathology behind its complications. -his diabetes is complicated by CVA, nephropathy, and history of heavy alcohol use/abuse and he remains at a high risk for more acute and chronic complications which include CAD, CVA, CKD, retinopathy, and neuropathy. These are all discussed in detail with him.  - I have counseled him on diet  and weight management  by adopting a carbohydrate restricted/protein rich diet. Patient is encouraged to switch to  unprocessed or minimally processed     complex starch and increased protein intake (animal or plant source), fruits, and vegetables. -  he is advised to stick to a routine mealtimes to eat 3 meals  a day and avoid unnecessary snacks ( to snack only to correct hypoglycemia).   - he acknowledges that there is a room for improvement in his food and drink choices. - Suggestion is made for him to avoid simple carbohydrates  from his diet including Cakes, Sweet Desserts, Ice Cream, Soda (diet and regular), Sweet Tea, Candies, Chips, Cookies, Store Bought Juices, Alcohol in Excess of  1-2 drinks a day, Artificial Sweeteners,  Coffee Creamer, and "Sugar-free" Products. This will help patient to have more stable blood glucose profile and potentially avoid unintended weight gain.  - he will be scheduled with Norm SaltPenny Crumpton, RDN, CDE for diabetes education.  - I have approached him with the following individualized plan to manage  his diabetes and patient agrees:   -Patient is experiencing unintended weight loss with Ozempic.  He was taken off of metformin and Jardiance due to CKD and unintended weight loss. He is currently on glimepiride 4 mg daily twice  daily. -He is approached to start monitoring blood glucose 4 times a day- before meals and at bedtime.  -In the meantime, he is advised to discontinue Amaryl and Ozempic.  Stay away from metformin and Jardiance.  He would benefit from low-dose glipizide.  I discussed initiated glipizide 5 mg XL p.o. daily at breakfast. If his renal function recovers, he will be reconsidered for low-dose metformin 500 mg p.o. daily on subsequent visits. If he returns with significantly above target glycemic profile, he will be considered for basal insulin.  - he is encouraged to call clinic for blood glucose levels less than 70 or above 200 mg /dl x3 in a week fasting   - Specific targets for  A1c;  LDL, HDL,  and Triglycerides were discussed with the patient.  2) Blood Pressure /Hypertension:  his blood pressure is  controlled to target.   he is advised to continue his current medications including amlodipine 5 mg p.o. daily.  He will be considered for low-dose ACE inhibitor or ARB if no contraindications from nephrology point of view.   3) Lipids/Hyperlipidemia: He does not have recent fasting lipid  panel to review.  He is advised to continue atorvastatin 20 mg p.o. nightly.    Side effects and precautions discussed with him.  4)  Weight/Diet:  Body mass index is 26.61 kg/m.  -  he is not a candidate for major weight loss. I discussed with him the fact that loss of 5 - 10% of his  current body weight will have the most impact on his diabetes management.  Exercise, and detailed carbohydrates information provided  -  detailed on discharge instructions.  5) Chronic Care/Health Maintenance:  -he  Is on Statin medications and  is encouraged to initiate and continue to follow up with Ophthalmology, Dentist,  Podiatrist at least yearly or according to recommendations, and advised to   stay away from smoking. I have recommended yearly flu vaccine and pneumonia vaccine at least every 5 years; moderate intensity  exercise for up to 150 minutes weekly; and  sleep for at least 7 hours a day.  - he is  advised to maintain close follow up with Patient, No Pcp Per (Inactive) for primary care needs, as well as his other providers for optimal and coordinated care.   I spent 60 minutes in the care of the patient today including review of labs from CMP, Lipids, Thyroid Function, Hematology (current and previous including abstractions from other facilities); face-to-face time discussing  his blood glucose readings/logs, discussing hypoglycemia and hyperglycemia episodes and symptoms, medications doses, his options of short and long term treatment based on the latest standards of care / guidelines;  discussion about incorporating lifestyle medicine;  and documenting the encounter.     Please refer to Patient Instructions for Blood Glucose Monitoring and Insulin/Medications Dosing Guide"  in media tab for additional information. Please  also refer to " Patient Self Inventory" in the Media  tab for reviewed elements of pertinent patient history.  Ritvik Bilello participated in the discussions, expressed understanding, and voiced agreement with the above plans.  All questions were answered to his satisfaction. he is encouraged to contact clinic should he have any questions or concerns prior to his return visit.   Follow up plan: - Return in about 10 days (around 05/04/2021) for F/U with Meter and Logs Only - no Labs.  Marquis Lunch, MD [facility] [address] [place], [place] [address] Phone: [phone]  Fax: [phone]    04/24/2021, 4:46 PM  This note was partially dictated with voice recognition software. Similar sounding words can be transcribed inadequately or may not  be corrected upon review.

## 2021-04-27 DIAGNOSIS — I129 Hypertensive chronic kidney disease with stage 1 through stage 4 chronic kidney disease, or unspecified chronic kidney disease: Secondary | ICD-10-CM | POA: Diagnosis not present

## 2021-04-27 DIAGNOSIS — E1122 Type 2 diabetes mellitus with diabetic chronic kidney disease: Secondary | ICD-10-CM | POA: Diagnosis not present

## 2021-04-27 DIAGNOSIS — N1831 Chronic kidney disease, stage 3a: Secondary | ICD-10-CM | POA: Diagnosis not present

## 2021-04-27 DIAGNOSIS — E7849 Other hyperlipidemia: Secondary | ICD-10-CM | POA: Diagnosis not present

## 2021-05-06 ENCOUNTER — Ambulatory Visit: Payer: Medicare Other | Admitting: "Endocrinology

## 2021-05-14 ENCOUNTER — Encounter: Payer: Self-pay | Admitting: "Endocrinology

## 2021-05-14 ENCOUNTER — Ambulatory Visit: Payer: Medicare Other | Admitting: "Endocrinology

## 2021-05-14 ENCOUNTER — Other Ambulatory Visit: Payer: Self-pay

## 2021-05-14 VITALS — BP 129/75 | HR 83 | Ht 63.0 in | Wt 151.0 lb

## 2021-05-14 DIAGNOSIS — N1832 Chronic kidney disease, stage 3b: Secondary | ICD-10-CM

## 2021-05-14 DIAGNOSIS — E1122 Type 2 diabetes mellitus with diabetic chronic kidney disease: Secondary | ICD-10-CM | POA: Diagnosis not present

## 2021-05-14 NOTE — Patient Instructions (Signed)

## 2021-05-14 NOTE — Progress Notes (Signed)
05/14/2021, 6:12 PM  Endocrinology follow-up note   Subjective:    Patient ID: Matthew Harrell, male    DOB: 25-Mar-1941.  Matthew Harrell is being seen in follow-up after he was seen in consultation for management of currently uncontrolled symptomatic diabetes requested by  Patient, No Pcp Per (Inactive).   Past Medical History:  Diagnosis Date  . Arthritis   . Diabetes (HCC)   . Diabetes mellitus, type II (HCC)   . Hypertension   . Kidney disease   . Stroke (cerebrum) [facility])     Past Surgical History:  Procedure Laterality Date  . NASAL SINUS SURGERY    . TOTAL HIP ARTHROPLASTY     Left    Social History   Socioeconomic History  . Marital status: Divorced    Spouse name: Not on file  . Number of children: 3  . Years of education: Not on file  . Highest education level: Not on file  Occupational History  . Occupation: retired  Tobacco Use  . Smoking status: Former Smoker    Packs/day: 1.00    Years: 10.00    Pack years: 10.00    Types: Cigarettes    Quit date: 12/29/1982    Years since quitting: 38.4  . Smokeless tobacco: Never Used  Substance and Sexual Activity  . Alcohol use: No  . Drug use: No  . Sexual activity: Not on file  Other Topics Concern  . Not on file  Social History Narrative   Lives alone.  Retired Designer, fashion/clothing.     Social Determinants of Health   Financial Resource Strain: Not on file  Food Insecurity: Not on file  Transportation Needs: Not on file  Physical Activity: Not on file  Stress: Not on file  Social Connections: Not on file    Family History  Problem Relation Age of Onset  . Diabetes Brother   . Stomach cancer Sister   . Aneurysm Mother 48  . Heart attack Father 52    Outpatient Encounter Medications as of 05/14/2021  Medication Sig  . Acetaminophen 500 MG capsule Take by mouth as needed for fever.  Marland Kitchen albuterol (PROVENTIL HFA;VENTOLIN HFA) 108 (90 Base) MCG/ACT inhaler Inhale 1-2 puffs into the  lungs every 6 (six) hours as needed for wheezing or shortness of breath.  Marland Kitchen amLODipine (NORVASC) 5 MG tablet Take 5 mg by mouth daily.  Marland Kitchen aspirin 81 MG tablet Take 1 tablet (81 mg total) by mouth daily. (Patient not taking: Reported on 04/24/2021)  . atorvastatin (LIPITOR) 20 MG tablet Take 40 mg by mouth daily.  . clopidogrel (PLAVIX) 75 MG tablet Take 1 tablet by mouth daily.  . cyanocobalamin 1000 MCG tablet Take 1 tablet by mouth daily.  . diclofenac sodium (VOLTAREN) 1 % GEL Apply 2 g topically 4 (four) times daily as needed (pain).   . DULoxetine (CYMBALTA) 30 MG capsule Take 30 mg by mouth every morning.  . dutasteride (AVODART) 0.5 MG capsule Take 0.5 mg by mouth every morning.  . fluticasone (FLONASE) 50 MCG/ACT nasal spray Place 2 sprays into both nostrils daily.  Marland Kitchen glipiZIDE (GLUCOTROL XL) 5 MG 24 hr tablet Take 1 tablet (5 mg total) by mouth daily with breakfast.  . HYDROcodone-acetaminophen (NORCO/VICODIN) 5-325 MG tablet Take 1 tablet by mouth every 6 (six) hours as needed for moderate pain. (Patient not taking: Reported on 04/24/2021)  . loratadine (CLARITIN) 10 MG tablet Take 10 mg by mouth daily.  . montelukast (  SINGULAIR) 10 MG tablet Take 1 tablet (10 mg total) by mouth at bedtime. (Patient not taking: Reported on 04/24/2021)  . Multiple Vitamins-Minerals (CENTRUM ADULTS) TABS Take 1 tablet by mouth daily in the afternoon. (Patient not taking: Reported on 04/24/2021)  . omeprazole (PRILOSEC) 20 MG capsule Take 1 capsule (20 mg total) by mouth daily.  . tamsulosin (FLOMAX) 0.4 MG CAPS capsule Take 0.4 mg by mouth every morning.  . traMADol (ULTRAM) 50 MG tablet Take 100 mg by mouth 3 (three) times daily as needed.   No facility-administered encounter medications on file as of 05/14/2021.    ALLERGIES: No Known Allergies  VACCINATION STATUS: Immunization History  Administered Date(s) Administered  . Influenza Split 09/28/2013  . Pneumococcal Polysaccharide-23 12/30/2007     Diabetes He presents for his follow-up diabetic visit. He has type 2 diabetes mellitus. Onset time: He was diagnosed at approximate age of 40 years. His disease course has been improving. There are no hypoglycemic associated symptoms. Pertinent negatives for hypoglycemia include no confusion, headaches, pallor or seizures. Pertinent negatives for diabetes include no chest pain, no fatigue, no polydipsia, no polyphagia, no polyuria, no weakness and no weight loss. There are no hypoglycemic complications. Symptoms are improving. Diabetic complications include a CVA and nephropathy. Risk factors for coronary artery disease include diabetes mellitus, dyslipidemia, family history, male sex, hypertension, sedentary lifestyle and tobacco exposure. Current diabetic treatments: He is currently on Ozempic 0.5 mg subcutaneous weekly.  He was recently taken off of Jardiance and metformin due to CKD. His weight is stable. He is following a generally unhealthy diet. When asked about meal planning, he reported none. He participates in exercise intermittently. His home blood glucose trend is fluctuating minimally. (He did not bring his meter nor logs.  He reports blood glucose readings are between 140 and 165.  No hypoglycemia.   )  Hyperlipidemia This is a chronic problem. The current episode started more than 1 year ago. Pertinent negatives include no chest pain, myalgias or shortness of breath. Current antihyperlipidemic treatment includes statins. Risk factors for coronary artery disease include dyslipidemia, diabetes mellitus, hypertension and male sex.  Hypertension This is a chronic problem. The current episode started more than 1 year ago. The problem is controlled. Pertinent negatives include no chest pain, headaches, neck pain, palpitations or shortness of breath. Risk factors for coronary artery disease include dyslipidemia, diabetes mellitus, male gender, sedentary lifestyle and smoking/tobacco exposure.  Hypertensive end-organ damage includes CVA.     Review of Systems  Constitutional: Negative for chills, fatigue, fever, unexpected weight change and weight loss.  HENT: Negative for dental problem, mouth sores and trouble swallowing.   Eyes: Negative for visual disturbance.  Respiratory: Negative for cough, choking, chest tightness, shortness of breath and wheezing.   Cardiovascular: Negative for chest pain, palpitations and leg swelling.  Gastrointestinal: Negative for abdominal distention, abdominal pain, constipation, diarrhea, nausea and vomiting.  Endocrine: Negative for polydipsia, polyphagia and polyuria.  Genitourinary: Negative for dysuria, flank pain, hematuria and urgency.  Musculoskeletal: Negative for back pain, gait problem, myalgias and neck pain.  Skin: Negative for pallor, rash and wound.  Neurological: Negative for seizures, syncope, weakness, numbness and headaches.  Psychiatric/Behavioral: Negative for confusion and dysphoric mood.    Objective:    Vitals with BMI 05/14/2021 04/24/2021 08/27/2018  Height 5\' 3"  5\' 3"  -  Weight 151 lbs 150 lbs 3 oz 145 lbs 5 oz  BMI 26.76 26.61 27.47  Systolic 129 126 [id number]  Diastolic 75 54 70  Pulse 83 84 85    BP 129/75   Pulse 83   Ht 5\' 3"  (1.6 m)   Wt 151 lb (68.5 kg)   BMI 26.75 kg/m   Wt Readings from Last 3 Encounters:  05/14/21 151 lb (68.5 kg)  04/24/21 150 lb 3.2 oz (68.1 kg)  08/27/18 145 lb 4.8 oz (65.9 kg)     Physical Exam Constitutional:      General: He is not in acute distress.    Appearance: He is well-developed.  HENT:     Head: Normocephalic and atraumatic.  Neck:     Thyroid: No thyromegaly.     Trachea: No tracheal deviation.  Cardiovascular:     Rate and Rhythm: Normal rate.     Pulses:          Dorsalis pedis pulses are 1+ on the right side and 1+ on the left side.       Posterior tibial pulses are 1+ on the right side and 1+ on the left side.     Heart sounds: Normal heart sounds, S1  normal and S2 normal. No murmur heard. No gallop.   Pulmonary:     Effort: Pulmonary effort is normal. No respiratory distress.     Breath sounds: No wheezing.  Abdominal:     General: Abdomen is flat. There is no distension.     Palpations: Abdomen is soft.     Tenderness: There is no abdominal tenderness. There is no guarding.  Musculoskeletal:     Right shoulder: No swelling or deformity.     Cervical back: Normal range of motion and neck supple.     Comments: Normal monofilament test on bilateral lower extremities.  Skin:    General: Skin is warm and dry.     Findings: No rash.     Nails: There is no clubbing.  Neurological:     Mental Status: He is alert and oriented to person, place, and time.     Cranial Nerves: No cranial nerve deficit.     Sensory: No sensory deficit.     Gait: Gait normal.     Deep Tendon Reflexes: Reflexes are normal and symmetric.  Psychiatric:        Speech: Speech normal.        Behavior: Behavior normal. Behavior is cooperative.        Thought Content: Thought content normal.        Judgment: Judgment normal.       CMP ( most recent) CMP     Component Value Date/Time   NA 137 08/27/2018 0418   K 4.7 08/27/2018 0418   CL 107 08/27/2018 0418   CO2 23 08/27/2018 0418   GLUCOSE 102 (H) 08/27/2018 0418   BUN 34 (A) 04/12/2021 0000   CREATININE 2.0 (A) 04/12/2021 0000   CREATININE 1.55 (H) 08/27/2018 0418   CALCIUM 8.9 08/27/2018 0418   PROT 8.2 (H) 08/23/2018 2025   ALBUMIN 4.3 08/23/2018 2025   AST 18 08/23/2018 2025   ALT 8 08/23/2018 2025   ALKPHOS 83 08/23/2018 2025   BILITOT 0.9 08/23/2018 2025   GFRNONAA 34 04/12/2021 0000   GFRAA 48 (L) 08/27/2018 0418     Diabetic Labs (most recent): Lab Results  Component Value Date   HGBA1C 8.1 04/12/2021   HGBA1C 7.9 03/20/2021   HGBA1C 7.4 12/04/2020     Assessment & Plan:   1. Type 2 diabetes mellitus with stage 3b chronic kidney disease, without long-term current use  of  insulin (HCC)  - Matthew Harrell has currently uncontrolled symptomatic type 2 DM since  80 years of age.  He did not bring his meter nor logs.  He reports blood glucose readings are between 140 and 165.  No hypoglycemia.     Recent labs reviewed. - I had a long discussion with him about the progressive nature of diabetes and the pathology behind its complications. -his diabetes is complicated by CVA, nephropathy, and history of heavy alcohol use/abuse and he remains at a high risk for more acute and chronic complications which include CAD, CVA, CKD, retinopathy, and neuropathy. These are all discussed in detail with him.  - I have counseled him on diet  and weight management  by adopting a carbohydrate restricted/protein rich diet. Patient is encouraged to switch to  unprocessed or minimally processed     complex starch and increased protein intake (animal or plant source), fruits, and vegetables. -  he is advised to stick to a routine mealtimes to eat 3 meals  a day and avoid unnecessary snacks ( to snack only to correct hypoglycemia).  - he acknowledges that there is a room for improvement in his food and drink choices. - Suggestion is made for him to avoid simple carbohydrates  from his diet including Cakes, Sweet Desserts, Ice Cream, Soda (diet and regular), Sweet Tea, Candies, Chips, Cookies, Store Bought Juices, Alcohol in Excess of  1-2 drinks a day, Artificial Sweeteners,  Coffee Creamer, and "Sugar-free" Products, Lemonade. This will help patient to have more stable blood glucose profile and potentially avoid unintended weight gain.    - he will be scheduled with Norm Salt, RDN, CDE for diabetes education.  - I have approached him with the following individualized plan to manage  his diabetes and patient agrees:   -Patient has stabilized his weight, did not lose any further.  He is advised to stay away from [facility] and metformin for now.  He already was off of Jardiance due to  unintended weight loss.    He is advised to continue glipizide 5 mg XL p.o. daily at breakfast.  He is willing to continue to monitor glucose twice a day- before breakfast and at bedtime.    If his renal function recovers, he will be reconsidered for low-dose metformin 500 mg p.o. daily on subsequent visits. If he returns with significantly above target glycemic profile, he will be considered for basal insulin.  - he is encouraged to call clinic for blood glucose levels less than 70 or above 200 mg /dl x3 in a week fasting.   - Specific targets for  A1c;  LDL, HDL,  and Triglycerides were discussed with the patient.  2) Blood Pressure /Hypertension: His blood pressure is controlled to target. he is advised to continue his current medications including amlodipine 5 mg p.o. daily.  He will be considered for low-dose ACE inhibitor or ARB if no contraindications from nephrology point of view.   3) Lipids/Hyperlipidemia: He does not have recent fasting lipid panel to review.  He is advised to continue atorvastatin 20 mg p.o. nightly.     Side effects and precautions discussed with him.  4)  Weight/Diet:  Body mass index is 26.75 kg/m.  -  he is not a candidate for major weight loss. I discussed with him the fact that loss of 5 - 10% of his  current body weight will have the most impact on his diabetes management.  Exercise, and detailed carbohydrates information provided  -  detailed on discharge instructions.  5) Chronic Care/Health Maintenance:  -he  Is on Statin medications and  is encouraged to initiate and continue to follow up with Ophthalmology, Dentist,  Podiatrist at least yearly or according to recommendations, and advised to   stay away from smoking. I have recommended yearly flu vaccine and pneumonia vaccine at least every 5 years; moderate intensity exercise for up to 150 minutes weekly; and  sleep for at least 7 hours a day.  - he is  advised to maintain close follow up with Patient,  No Pcp Per (Inactive) for primary care needs, as well as his other providers for optimal and coordinated care.   I spent 31 minutes in the care of the patient today including review of labs from Thyroid Function, CMP, and other relevant labs ; imaging/biopsy records (current and previous including abstractions from other facilities); face-to-face time discussing  his lab results and symptoms, medications doses, his options of short and long term treatment based on the latest standards of care / guidelines;   and documenting the encounter.  Matthew Harrell  participated in the discussions, expressed understanding, and voiced agreement with the above plans.  All questions were answered to his satisfaction. he is encouraged to contact clinic should he have any questions or concerns prior to his return visit.   Follow up plan: - Return in about 9 weeks (around 07/16/2021) for F/U with Pre-visit Labs, Meter, Logs, A1c here.Marquis Lunch, MD [facility] [address] [place], [place] [address] Phone: [phone]  Fax: [phone]    05/14/2021, 6:12 PM  This note was partially dictated with voice recognition software. Similar sounding words can be transcribed inadequately or may not  be corrected upon review.

## 2021-05-27 DIAGNOSIS — E1122 Type 2 diabetes mellitus with diabetic chronic kidney disease: Secondary | ICD-10-CM | POA: Diagnosis not present

## 2021-05-27 DIAGNOSIS — N1831 Chronic kidney disease, stage 3a: Secondary | ICD-10-CM | POA: Diagnosis not present

## 2021-05-27 DIAGNOSIS — E7849 Other hyperlipidemia: Secondary | ICD-10-CM | POA: Diagnosis not present

## 2021-05-27 DIAGNOSIS — I129 Hypertensive chronic kidney disease with stage 1 through stage 4 chronic kidney disease, or unspecified chronic kidney disease: Secondary | ICD-10-CM | POA: Diagnosis not present

## 2021-06-04 DIAGNOSIS — I10 Essential (primary) hypertension: Secondary | ICD-10-CM | POA: Diagnosis not present

## 2021-06-04 DIAGNOSIS — E1169 Type 2 diabetes mellitus with other specified complication: Secondary | ICD-10-CM | POA: Diagnosis not present

## 2021-06-04 DIAGNOSIS — N1831 Chronic kidney disease, stage 3a: Secondary | ICD-10-CM | POA: Diagnosis not present

## 2021-06-04 DIAGNOSIS — R410 Disorientation, unspecified: Secondary | ICD-10-CM | POA: Diagnosis not present

## 2021-06-04 DIAGNOSIS — D529 Folate deficiency anemia, unspecified: Secondary | ICD-10-CM | POA: Diagnosis not present

## 2021-06-04 DIAGNOSIS — D519 Vitamin B12 deficiency anemia, unspecified: Secondary | ICD-10-CM | POA: Diagnosis not present

## 2021-06-04 DIAGNOSIS — G629 Polyneuropathy, unspecified: Secondary | ICD-10-CM | POA: Diagnosis not present

## 2021-06-04 DIAGNOSIS — E875 Hyperkalemia: Secondary | ICD-10-CM | POA: Diagnosis not present

## 2021-06-04 DIAGNOSIS — K219 Gastro-esophageal reflux disease without esophagitis: Secondary | ICD-10-CM | POA: Diagnosis not present

## 2021-06-04 DIAGNOSIS — R809 Proteinuria, unspecified: Secondary | ICD-10-CM | POA: Diagnosis not present

## 2021-06-04 DIAGNOSIS — E7800 Pure hypercholesterolemia, unspecified: Secondary | ICD-10-CM | POA: Diagnosis not present

## 2021-06-19 DIAGNOSIS — D631 Anemia in chronic kidney disease: Secondary | ICD-10-CM | POA: Diagnosis not present

## 2021-06-19 DIAGNOSIS — E1122 Type 2 diabetes mellitus with diabetic chronic kidney disease: Secondary | ICD-10-CM | POA: Diagnosis not present

## 2021-06-19 DIAGNOSIS — N1831 Chronic kidney disease, stage 3a: Secondary | ICD-10-CM | POA: Diagnosis not present

## 2021-06-19 DIAGNOSIS — E1142 Type 2 diabetes mellitus with diabetic polyneuropathy: Secondary | ICD-10-CM | POA: Diagnosis not present

## 2021-06-19 DIAGNOSIS — L409 Psoriasis, unspecified: Secondary | ICD-10-CM | POA: Diagnosis not present

## 2021-06-19 DIAGNOSIS — E1169 Type 2 diabetes mellitus with other specified complication: Secondary | ICD-10-CM | POA: Diagnosis not present

## 2021-06-19 DIAGNOSIS — E7800 Pure hypercholesterolemia, unspecified: Secondary | ICD-10-CM | POA: Diagnosis not present

## 2021-06-19 DIAGNOSIS — K219 Gastro-esophageal reflux disease without esophagitis: Secondary | ICD-10-CM | POA: Diagnosis not present

## 2021-06-19 DIAGNOSIS — M1990 Unspecified osteoarthritis, unspecified site: Secondary | ICD-10-CM | POA: Diagnosis not present

## 2021-06-19 DIAGNOSIS — R410 Disorientation, unspecified: Secondary | ICD-10-CM | POA: Diagnosis not present

## 2021-06-19 DIAGNOSIS — I129 Hypertensive chronic kidney disease with stage 1 through stage 4 chronic kidney disease, or unspecified chronic kidney disease: Secondary | ICD-10-CM | POA: Diagnosis not present

## 2021-06-19 DIAGNOSIS — Z8673 Personal history of transient ischemic attack (TIA), and cerebral infarction without residual deficits: Secondary | ICD-10-CM | POA: Diagnosis not present

## 2021-06-24 DIAGNOSIS — E1169 Type 2 diabetes mellitus with other specified complication: Secondary | ICD-10-CM | POA: Diagnosis not present

## 2021-06-24 DIAGNOSIS — E1142 Type 2 diabetes mellitus with diabetic polyneuropathy: Secondary | ICD-10-CM | POA: Diagnosis not present

## 2021-06-24 DIAGNOSIS — N1831 Chronic kidney disease, stage 3a: Secondary | ICD-10-CM | POA: Diagnosis not present

## 2021-06-24 DIAGNOSIS — D631 Anemia in chronic kidney disease: Secondary | ICD-10-CM | POA: Diagnosis not present

## 2021-06-24 DIAGNOSIS — E7800 Pure hypercholesterolemia, unspecified: Secondary | ICD-10-CM | POA: Diagnosis not present

## 2021-06-24 DIAGNOSIS — Z8673 Personal history of transient ischemic attack (TIA), and cerebral infarction without residual deficits: Secondary | ICD-10-CM | POA: Diagnosis not present

## 2021-06-24 DIAGNOSIS — L409 Psoriasis, unspecified: Secondary | ICD-10-CM | POA: Diagnosis not present

## 2021-06-24 DIAGNOSIS — R410 Disorientation, unspecified: Secondary | ICD-10-CM | POA: Diagnosis not present

## 2021-06-24 DIAGNOSIS — I129 Hypertensive chronic kidney disease with stage 1 through stage 4 chronic kidney disease, or unspecified chronic kidney disease: Secondary | ICD-10-CM | POA: Diagnosis not present

## 2021-06-24 DIAGNOSIS — K219 Gastro-esophageal reflux disease without esophagitis: Secondary | ICD-10-CM | POA: Diagnosis not present

## 2021-06-24 DIAGNOSIS — M1990 Unspecified osteoarthritis, unspecified site: Secondary | ICD-10-CM | POA: Diagnosis not present

## 2021-06-24 DIAGNOSIS — E1122 Type 2 diabetes mellitus with diabetic chronic kidney disease: Secondary | ICD-10-CM | POA: Diagnosis not present

## 2021-06-25 DIAGNOSIS — I129 Hypertensive chronic kidney disease with stage 1 through stage 4 chronic kidney disease, or unspecified chronic kidney disease: Secondary | ICD-10-CM | POA: Diagnosis not present

## 2021-06-25 DIAGNOSIS — E875 Hyperkalemia: Secondary | ICD-10-CM | POA: Diagnosis not present

## 2021-06-25 DIAGNOSIS — E559 Vitamin D deficiency, unspecified: Secondary | ICD-10-CM | POA: Diagnosis not present

## 2021-06-25 DIAGNOSIS — D631 Anemia in chronic kidney disease: Secondary | ICD-10-CM | POA: Diagnosis not present

## 2021-06-25 DIAGNOSIS — N189 Chronic kidney disease, unspecified: Secondary | ICD-10-CM | POA: Diagnosis not present

## 2021-06-25 DIAGNOSIS — E1122 Type 2 diabetes mellitus with diabetic chronic kidney disease: Secondary | ICD-10-CM | POA: Diagnosis not present

## 2021-06-27 DIAGNOSIS — E7849 Other hyperlipidemia: Secondary | ICD-10-CM | POA: Diagnosis not present

## 2021-06-27 DIAGNOSIS — E1122 Type 2 diabetes mellitus with diabetic chronic kidney disease: Secondary | ICD-10-CM | POA: Diagnosis not present

## 2021-06-27 DIAGNOSIS — I129 Hypertensive chronic kidney disease with stage 1 through stage 4 chronic kidney disease, or unspecified chronic kidney disease: Secondary | ICD-10-CM | POA: Diagnosis not present

## 2021-06-27 DIAGNOSIS — N1831 Chronic kidney disease, stage 3a: Secondary | ICD-10-CM | POA: Diagnosis not present

## 2021-07-03 DIAGNOSIS — E1169 Type 2 diabetes mellitus with other specified complication: Secondary | ICD-10-CM | POA: Diagnosis not present

## 2021-07-03 DIAGNOSIS — M1990 Unspecified osteoarthritis, unspecified site: Secondary | ICD-10-CM | POA: Diagnosis not present

## 2021-07-03 DIAGNOSIS — I129 Hypertensive chronic kidney disease with stage 1 through stage 4 chronic kidney disease, or unspecified chronic kidney disease: Secondary | ICD-10-CM | POA: Diagnosis not present

## 2021-07-03 DIAGNOSIS — D631 Anemia in chronic kidney disease: Secondary | ICD-10-CM | POA: Diagnosis not present

## 2021-07-03 DIAGNOSIS — E1122 Type 2 diabetes mellitus with diabetic chronic kidney disease: Secondary | ICD-10-CM | POA: Diagnosis not present

## 2021-07-03 DIAGNOSIS — E1142 Type 2 diabetes mellitus with diabetic polyneuropathy: Secondary | ICD-10-CM | POA: Diagnosis not present

## 2021-07-03 DIAGNOSIS — R410 Disorientation, unspecified: Secondary | ICD-10-CM | POA: Diagnosis not present

## 2021-07-03 DIAGNOSIS — E7800 Pure hypercholesterolemia, unspecified: Secondary | ICD-10-CM | POA: Diagnosis not present

## 2021-07-03 DIAGNOSIS — K219 Gastro-esophageal reflux disease without esophagitis: Secondary | ICD-10-CM | POA: Diagnosis not present

## 2021-07-03 DIAGNOSIS — Z8673 Personal history of transient ischemic attack (TIA), and cerebral infarction without residual deficits: Secondary | ICD-10-CM | POA: Diagnosis not present

## 2021-07-03 DIAGNOSIS — N1831 Chronic kidney disease, stage 3a: Secondary | ICD-10-CM | POA: Diagnosis not present

## 2021-07-03 DIAGNOSIS — L409 Psoriasis, unspecified: Secondary | ICD-10-CM | POA: Diagnosis not present

## 2021-07-05 DIAGNOSIS — K219 Gastro-esophageal reflux disease without esophagitis: Secondary | ICD-10-CM | POA: Diagnosis not present

## 2021-07-05 DIAGNOSIS — N1831 Chronic kidney disease, stage 3a: Secondary | ICD-10-CM | POA: Diagnosis not present

## 2021-07-05 DIAGNOSIS — I10 Essential (primary) hypertension: Secondary | ICD-10-CM | POA: Diagnosis not present

## 2021-07-10 DIAGNOSIS — D649 Anemia, unspecified: Secondary | ICD-10-CM | POA: Diagnosis not present

## 2021-07-10 DIAGNOSIS — N289 Disorder of kidney and ureter, unspecified: Secondary | ICD-10-CM | POA: Diagnosis not present

## 2021-07-10 DIAGNOSIS — Z87891 Personal history of nicotine dependence: Secondary | ICD-10-CM | POA: Diagnosis not present

## 2021-07-10 DIAGNOSIS — Z743 Need for continuous supervision: Secondary | ICD-10-CM | POA: Diagnosis not present

## 2021-07-10 DIAGNOSIS — Z79899 Other long term (current) drug therapy: Secondary | ICD-10-CM | POA: Diagnosis not present

## 2021-07-10 DIAGNOSIS — I129 Hypertensive chronic kidney disease with stage 1 through stage 4 chronic kidney disease, or unspecified chronic kidney disease: Secondary | ICD-10-CM | POA: Diagnosis not present

## 2021-07-10 DIAGNOSIS — E1122 Type 2 diabetes mellitus with diabetic chronic kidney disease: Secondary | ICD-10-CM | POA: Diagnosis not present

## 2021-07-10 DIAGNOSIS — Z7982 Long term (current) use of aspirin: Secondary | ICD-10-CM | POA: Diagnosis not present

## 2021-07-10 DIAGNOSIS — W19XXXA Unspecified fall, initial encounter: Secondary | ICD-10-CM | POA: Diagnosis not present

## 2021-07-10 DIAGNOSIS — Z96642 Presence of left artificial hip joint: Secondary | ICD-10-CM | POA: Diagnosis not present

## 2021-07-10 DIAGNOSIS — N183 Chronic kidney disease, stage 3 unspecified: Secondary | ICD-10-CM | POA: Diagnosis not present

## 2021-07-10 DIAGNOSIS — R410 Disorientation, unspecified: Secondary | ICD-10-CM | POA: Diagnosis not present

## 2021-07-10 DIAGNOSIS — R739 Hyperglycemia, unspecified: Secondary | ICD-10-CM | POA: Diagnosis not present

## 2021-07-10 DIAGNOSIS — R404 Transient alteration of awareness: Secondary | ICD-10-CM | POA: Diagnosis not present

## 2021-07-10 DIAGNOSIS — G319 Degenerative disease of nervous system, unspecified: Secondary | ICD-10-CM | POA: Diagnosis not present

## 2021-07-17 ENCOUNTER — Ambulatory Visit: Payer: Medicare Other | Admitting: Nurse Practitioner

## 2021-07-18 DIAGNOSIS — M1990 Unspecified osteoarthritis, unspecified site: Secondary | ICD-10-CM | POA: Diagnosis not present

## 2021-07-18 DIAGNOSIS — I129 Hypertensive chronic kidney disease with stage 1 through stage 4 chronic kidney disease, or unspecified chronic kidney disease: Secondary | ICD-10-CM | POA: Diagnosis not present

## 2021-07-18 DIAGNOSIS — K219 Gastro-esophageal reflux disease without esophagitis: Secondary | ICD-10-CM | POA: Diagnosis not present

## 2021-07-18 DIAGNOSIS — E1169 Type 2 diabetes mellitus with other specified complication: Secondary | ICD-10-CM | POA: Diagnosis not present

## 2021-07-18 DIAGNOSIS — E7800 Pure hypercholesterolemia, unspecified: Secondary | ICD-10-CM | POA: Diagnosis not present

## 2021-07-18 DIAGNOSIS — E1122 Type 2 diabetes mellitus with diabetic chronic kidney disease: Secondary | ICD-10-CM | POA: Diagnosis not present

## 2021-07-18 DIAGNOSIS — R410 Disorientation, unspecified: Secondary | ICD-10-CM | POA: Diagnosis not present

## 2021-07-18 DIAGNOSIS — N1831 Chronic kidney disease, stage 3a: Secondary | ICD-10-CM | POA: Diagnosis not present

## 2021-07-18 DIAGNOSIS — L409 Psoriasis, unspecified: Secondary | ICD-10-CM | POA: Diagnosis not present

## 2021-07-18 DIAGNOSIS — Z8673 Personal history of transient ischemic attack (TIA), and cerebral infarction without residual deficits: Secondary | ICD-10-CM | POA: Diagnosis not present

## 2021-07-18 DIAGNOSIS — D631 Anemia in chronic kidney disease: Secondary | ICD-10-CM | POA: Diagnosis not present

## 2021-07-18 DIAGNOSIS — E1142 Type 2 diabetes mellitus with diabetic polyneuropathy: Secondary | ICD-10-CM | POA: Diagnosis not present

## 2021-07-28 DIAGNOSIS — I129 Hypertensive chronic kidney disease with stage 1 through stage 4 chronic kidney disease, or unspecified chronic kidney disease: Secondary | ICD-10-CM | POA: Diagnosis not present

## 2021-07-28 DIAGNOSIS — E7849 Other hyperlipidemia: Secondary | ICD-10-CM | POA: Diagnosis not present

## 2021-07-28 DIAGNOSIS — N1831 Chronic kidney disease, stage 3a: Secondary | ICD-10-CM | POA: Diagnosis not present

## 2021-07-28 DIAGNOSIS — E1122 Type 2 diabetes mellitus with diabetic chronic kidney disease: Secondary | ICD-10-CM | POA: Diagnosis not present

## 2021-08-23 DIAGNOSIS — G629 Polyneuropathy, unspecified: Secondary | ICD-10-CM | POA: Diagnosis not present

## 2021-08-23 DIAGNOSIS — E1169 Type 2 diabetes mellitus with other specified complication: Secondary | ICD-10-CM | POA: Diagnosis not present

## 2021-08-23 DIAGNOSIS — E7800 Pure hypercholesterolemia, unspecified: Secondary | ICD-10-CM | POA: Diagnosis not present

## 2021-08-23 DIAGNOSIS — N1831 Chronic kidney disease, stage 3a: Secondary | ICD-10-CM | POA: Diagnosis not present

## 2021-08-23 DIAGNOSIS — K219 Gastro-esophageal reflux disease without esophagitis: Secondary | ICD-10-CM | POA: Diagnosis not present

## 2021-08-23 DIAGNOSIS — R809 Proteinuria, unspecified: Secondary | ICD-10-CM | POA: Diagnosis not present

## 2021-08-23 DIAGNOSIS — R410 Disorientation, unspecified: Secondary | ICD-10-CM | POA: Diagnosis not present

## 2021-08-23 DIAGNOSIS — I10 Essential (primary) hypertension: Secondary | ICD-10-CM | POA: Diagnosis not present

## 2021-08-23 DIAGNOSIS — E875 Hyperkalemia: Secondary | ICD-10-CM | POA: Diagnosis not present

## 2021-08-23 LAB — BASIC METABOLIC PANEL
BUN: 37 — AB (ref 4–21)
CO2: 21 (ref 13–22)
Chloride: 101 (ref 99–108)
Creatinine: 2.3 — AB (ref <=1.3)
Glucose: 456
Potassium: 5.5 — AB (ref 3.4–5.3)
Sodium: 139 (ref 137–147)

## 2021-08-23 LAB — HEPATIC FUNCTION PANEL
ALT: 5 — AB (ref 10–40)
AST: 13 — AB (ref 14–40)
Alkaline Phosphatase: 137 — AB (ref 25–125)
Bilirubin, Total: 0.8

## 2021-08-23 LAB — CBC AND DIFFERENTIAL
HCT: 31 — AB (ref 41–53)
Neutrophils Absolute: 3
WBC: 4.8

## 2021-08-23 LAB — COMPREHENSIVE METABOLIC PANEL
Albumin: 4.3 (ref 3.5–5.0)
Calcium: 8.8 (ref 8.7–10.7)
GFR calc non Af Amer: 28
Globulin: 2.2

## 2021-08-23 LAB — VITAMIN D 25 HYDROXY (VIT D DEFICIENCY, FRACTURES): Vit D, 25-Hydroxy: 23.3

## 2021-08-28 DIAGNOSIS — E559 Vitamin D deficiency, unspecified: Secondary | ICD-10-CM | POA: Diagnosis not present

## 2021-08-28 DIAGNOSIS — N189 Chronic kidney disease, unspecified: Secondary | ICD-10-CM | POA: Diagnosis not present

## 2021-08-28 DIAGNOSIS — D631 Anemia in chronic kidney disease: Secondary | ICD-10-CM | POA: Diagnosis not present

## 2021-08-28 DIAGNOSIS — I129 Hypertensive chronic kidney disease with stage 1 through stage 4 chronic kidney disease, or unspecified chronic kidney disease: Secondary | ICD-10-CM | POA: Diagnosis not present

## 2021-08-28 DIAGNOSIS — E1122 Type 2 diabetes mellitus with diabetic chronic kidney disease: Secondary | ICD-10-CM | POA: Diagnosis not present

## 2021-08-28 DIAGNOSIS — N170 Acute kidney failure with tubular necrosis: Secondary | ICD-10-CM | POA: Diagnosis not present

## 2021-08-28 DIAGNOSIS — E7849 Other hyperlipidemia: Secondary | ICD-10-CM | POA: Diagnosis not present

## 2021-08-28 DIAGNOSIS — N1831 Chronic kidney disease, stage 3a: Secondary | ICD-10-CM | POA: Diagnosis not present

## 2021-08-28 DIAGNOSIS — E875 Hyperkalemia: Secondary | ICD-10-CM | POA: Diagnosis not present

## 2021-08-29 ENCOUNTER — Encounter: Payer: Self-pay | Admitting: "Endocrinology

## 2021-08-29 ENCOUNTER — Other Ambulatory Visit: Payer: Self-pay

## 2021-08-29 ENCOUNTER — Ambulatory Visit: Payer: Medicare Other | Admitting: "Endocrinology

## 2021-08-29 VITALS — BP 128/69 | HR 80 | Ht 63.0 in | Wt 152.2 lb

## 2021-08-29 DIAGNOSIS — Z87891 Personal history of nicotine dependence: Secondary | ICD-10-CM | POA: Diagnosis not present

## 2021-08-29 DIAGNOSIS — E785 Hyperlipidemia, unspecified: Secondary | ICD-10-CM

## 2021-08-29 DIAGNOSIS — E1159 Type 2 diabetes mellitus with other circulatory complications: Secondary | ICD-10-CM | POA: Diagnosis not present

## 2021-08-29 DIAGNOSIS — E1122 Type 2 diabetes mellitus with diabetic chronic kidney disease: Secondary | ICD-10-CM

## 2021-08-29 DIAGNOSIS — I10 Essential (primary) hypertension: Secondary | ICD-10-CM | POA: Diagnosis not present

## 2021-08-29 DIAGNOSIS — N1832 Chronic kidney disease, stage 3b: Secondary | ICD-10-CM | POA: Diagnosis not present

## 2021-08-29 DIAGNOSIS — E1140 Type 2 diabetes mellitus with diabetic neuropathy, unspecified: Secondary | ICD-10-CM | POA: Diagnosis not present

## 2021-08-29 DIAGNOSIS — Z789 Other specified health status: Secondary | ICD-10-CM | POA: Diagnosis not present

## 2021-08-29 LAB — HEMOGLOBIN A1C: Hemoglobin A1C: 11

## 2021-08-29 MED ORDER — TRESIBA FLEXTOUCH 100 UNIT/ML ~~LOC~~ SOPN: 20.0000 [IU] | PEN_INJECTOR | Freq: Every day | SUBCUTANEOUS | 1 refills | Status: DC

## 2021-08-29 MED ORDER — BD PEN NEEDLE SHORT U/F 31G X 8 MM MISC: 1.0000 | 3 refills | Status: DC

## 2021-08-29 NOTE — Progress Notes (Signed)
08/29/2021, 12:01 PM  Endocrinology follow-up note   Subjective:    Patient ID: Matthew Harrell, male    DOB: [birth year].  Matthew Harrell is being seen in follow-up after he was seen in consultation for management of currently uncontrolled symptomatic diabetes requested by  Matthew Potts, Matthew Harrell. He is accompanied by his granddaughter Matthew Harrell who is offering help.  Past Medical History:  Diagnosis Date   Arthritis    Diabetes (HCC)    Diabetes mellitus, type II (HCC)    Hypertension    Kidney disease    Stroke (cerebrum) (HCC)     Past Surgical History:  Procedure Laterality Date   NASAL SINUS SURGERY     TOTAL HIP ARTHROPLASTY     Left    Social History   Socioeconomic History   Marital status: Divorced    Spouse name: Not on file   Number of children: 3   Years of education: Not on file   Highest education level: Not on file  Occupational History   Occupation: retired  Tobacco Use   Smoking status: Former    Packs/day: 1.00    Years: 10.00    Pack years: 10.00    Types: Cigarettes    Quit date: 12/29/1982    Years since quitting: 38.6   Smokeless tobacco: Never  Substance and Sexual Activity   Alcohol use: No   Drug use: No   Sexual activity: Not on file  Other Topics Concern   Not on file  Social History Narrative   Lives alone.  Retired Designer, fashion/clothing.     Social Determinants of Health   Financial Resource Strain: Not on file  Food Insecurity: Not on file  Transportation Needs: Not on file  Physical Activity: Not on file  Stress: Not on file  Social Connections: Not on file    Family History  Problem Relation Age of Onset   Diabetes Brother    Stomach cancer Sister    Aneurysm Mother 16   Heart attack Father 49    Outpatient Encounter Medications as of 08/29/2021  Medication Sig   insulin degludec (TRESIBA FLEXTOUCH) 100 UNIT/ML FlexTouch Pen Inject 20 Units into the skin at bedtime.   Insulin Pen Needle (B-D ULTRAFINE  III SHORT PEN) 31G X 8 MM MISC 1 each by Does not apply route as directed.   Acetaminophen 500 MG capsule Take by mouth as needed for fever.   albuterol (PROVENTIL HFA;VENTOLIN HFA) 108 (90 Base) MCG/ACT inhaler Inhale 1-2 puffs into the lungs every 6 (six) hours as needed for wheezing or shortness of breath.   amLODipine (NORVASC) 5 MG tablet Take 5 mg by mouth daily.   aspirin 81 MG tablet Take 1 tablet (81 mg total) by mouth daily. (Patient not taking: Reported on 04/24/2021)   atorvastatin (LIPITOR) 20 MG tablet Take 40 mg by mouth daily.   clopidogrel (PLAVIX) 75 MG tablet Take 1 tablet by mouth daily.   cyanocobalamin 1000 MCG tablet Take 1 tablet by mouth daily.   diclofenac sodium (VOLTAREN) 1 % GEL Apply 2 g topically 4 (four) times daily as needed (pain).    DULoxetine (CYMBALTA) 30 MG capsule Take 30 mg by mouth every morning.   dutasteride (AVODART) 0.5 MG capsule Take 0.5 mg by mouth every morning.   fluticasone (FLONASE) 50 MCG/ACT nasal spray Place 2 sprays into both nostrils daily.   glipiZIDE (GLUCOTROL XL) 5 MG 24 hr tablet Take 1 tablet (5 mg  total) by mouth daily with breakfast.   HYDROcodone-acetaminophen (NORCO/VICODIN) 5-325 MG tablet Take 1 tablet by mouth every 6 (six) hours as needed for moderate pain. (Patient not taking: Reported on 04/24/2021)   LOKELMA 5 g packet SMARTSIG:5 Gram(s) By Mouth Twice a Week   loratadine (CLARITIN) 10 MG tablet Take 10 mg by mouth daily.   montelukast (SINGULAIR) 10 MG tablet Take 1 tablet (10 mg total) by mouth at bedtime. (Patient not taking: Reported on 04/24/2021)   Multiple Vitamins-Minerals (CENTRUM ADULTS) TABS Take 1 tablet by mouth daily in the afternoon. (Patient not taking: Reported on 04/24/2021)   omeprazole (PRILOSEC) 20 MG capsule Take 1 capsule (20 mg total) by mouth daily.   tamsulosin (FLOMAX) 0.4 MG CAPS capsule Take 0.4 mg by mouth every morning.   traMADol (ULTRAM) 50 MG tablet Take 100 mg by mouth 3 (three) times daily  as needed.   No facility-administered encounter medications on file as of 08/29/2021.    ALLERGIES: No Known Allergies  VACCINATION STATUS: Immunization History  Administered Date(s) Administered   Influenza Split 09/28/2013   Pneumococcal Polysaccharide-23 12/30/2007    Diabetes He presents for his follow-up diabetic visit. He has type 2 diabetes mellitus. Onset time: He was diagnosed at approximate age of 40 years. His disease course has been worsening. There are no hypoglycemic associated symptoms. Pertinent negatives for hypoglycemia include no confusion, headaches, pallor or seizures. Associated symptoms include polydipsia and polyuria. Pertinent negatives for diabetes include no chest pain, no fatigue, no polyphagia, no weakness and no weight loss. There are no hypoglycemic complications. Symptoms are worsening. Diabetic complications include a CVA and nephropathy. Risk factors for coronary artery disease include diabetes mellitus, dyslipidemia, family history, male sex, hypertension, sedentary lifestyle and tobacco exposure. Current diabetic treatments: He is currently on Ozempic 0.5 mg subcutaneous weekly.  He was recently taken off of Jardiance and metformin due to CKD. His weight is stable. He is following a generally unhealthy diet. When asked about meal planning, he reported none. He participates in exercise intermittently. His home blood glucose trend is increasing steadily. His overall blood glucose range is >200 mg/dl. (He presents with loss of control of glycemia.  His meter shows few readings averaging 271.  He brought 2 m which has only 12 readings in the last 90 days.  His point-of-care A1c is 11% increasing from 8.1%.   )  Hyperlipidemia This is a chronic problem. The current episode started more than 1 year ago. Pertinent negatives include no chest pain, myalgias or shortness of breath. Current antihyperlipidemic treatment includes statins. Risk factors for coronary artery  disease include dyslipidemia, diabetes mellitus, hypertension and male sex.  Hypertension This is a chronic problem. The current episode started more than 1 year ago. The problem is controlled. Pertinent negatives include no chest pain, headaches, neck pain, palpitations or shortness of breath. Risk factors for coronary artery disease include dyslipidemia, diabetes mellitus, male gender, sedentary lifestyle and smoking/tobacco exposure. Hypertensive end-organ damage includes CVA.    Review of Systems  Constitutional:  Negative for chills, fatigue, fever, unexpected weight change and weight loss.  HENT:  Negative for dental problem, mouth sores and trouble swallowing.   Eyes:  Negative for visual disturbance.  Respiratory:  Negative for cough, choking, chest tightness, shortness of breath and wheezing.   Cardiovascular:  Negative for chest pain, palpitations and leg swelling.  Gastrointestinal:  Negative for abdominal distention, abdominal pain, constipation, diarrhea, nausea and vomiting.  Endocrine: Positive for polydipsia and polyuria. Negative  for polyphagia.  Genitourinary:  Negative for dysuria, flank pain, hematuria and urgency.  Musculoskeletal:  Negative for back pain, gait problem, myalgias and neck pain.  Skin:  Negative for pallor, rash and wound.  Neurological:  Negative for seizures, syncope, weakness, numbness and headaches.  Psychiatric/Behavioral:  Negative for confusion and dysphoric mood.    Objective:    Vitals with BMI 08/29/2021 05/14/2021 04/24/2021  Height 5\' 3"  5\' 3"  5\' 3"   Weight 152 lbs 3 oz 151 lbs 150 lbs 3 oz  BMI 26.97 26.76 26.61  Systolic 128 129  Diastolic 69 75 54  Pulse 80 83 84    BP 128/69   Pulse 80   Ht 5\' 3"  (1.6 m)   Wt 152 lb 3.2 oz (69 kg)   BMI 26.96 kg/m   Wt Readings from Last 3 Encounters:  08/29/21 152 lb 3.2 oz (69 kg)  05/14/21 151 lb (68.5 kg)  04/24/21 150 lb 3.2 oz (68.1 kg)     Physical Exam Constitutional:       General: He is not in acute distress.    Appearance: He is well-developed.  HENT:     Head: Normocephalic and atraumatic.  Neck:     Thyroid: No thyromegaly.     Trachea: No tracheal deviation.  Cardiovascular:     Rate and Rhythm: Normal rate.     Pulses:          Dorsalis pedis pulses are 1+ on the right side and 1+ on the left side.       Posterior tibial pulses are 1+ on the right side and 1+ on the left side.     Heart sounds: Normal heart sounds, S1 normal and S2 normal. No murmur heard.   No gallop.  Pulmonary:     Effort: Pulmonary effort is normal. No respiratory distress.     Breath sounds: No wheezing.  Abdominal:     General: Abdomen is flat. There is no distension.     Palpations: Abdomen is soft.     Tenderness: There is no abdominal tenderness. There is no guarding.  Musculoskeletal:     Right shoulder: No swelling or deformity.     Cervical back: Normal range of motion and neck supple.     Comments: Normal monofilament test on bilateral lower extremities.  Skin:    General: Skin is warm and dry.     Findings: No rash.     Nails: There is no clubbing.  Neurological:     Mental Status: He is alert and oriented to person, place, and time.     Cranial Nerves: No cranial nerve deficit.     Sensory: No sensory deficit.     Gait: Gait normal.     Deep Tendon Reflexes: Reflexes are normal and symmetric.  Psychiatric:        Speech: Speech normal.        Behavior: Behavior normal. Behavior is cooperative.        Thought Content: Thought content normal.        Judgment: Judgment normal.      CMP ( most recent) CMP     Component Value Date/Time   NA 139 08/23/2021 0000   K 5.5 (A) 08/23/2021 0000   CL 101 08/23/2021 0000   CO2 21 08/23/2021 0000   GLUCOSE 102 (H) 08/27/2018 0418   BUN 37 (A) 08/23/2021 0000   CREATININE 2.3 (A) 08/23/2021 0000   CREATININE 1.55 (H) 08/27/2018 08/29/2018  CALCIUM 8.8 08/23/2021 0000   PROT 8.2 (H) 08/23/2018 2025   ALBUMIN 4.3  08/23/2021 0000   AST 13 (A) 08/23/2021 0000   ALT 5 (A) 08/23/2021 0000   ALKPHOS 137 (A) 08/23/2021 0000   BILITOT 0.9 08/23/2018 2025   GFRNONAA 28 08/23/2021 0000   GFRAA 48 (L) 08/27/2018 0418     Diabetic Labs (most recent): Lab Results  Component Value Date   HGBA1C 8.1 04/12/2021   HGBA1C 7.9 03/20/2021   HGBA1C 7.4 12/04/2020     Assessment & Plan:   1. Type 2 diabetes mellitus with stage 3b chronic kidney disease, without long-term current use of insulin (HCC)  - Xavion Lias has currently uncontrolled symptomatic type 2 DM since  80 years of age.  He presents with loss of control of glycemia.  His meter shows few readings averaging 271.  He brought 2 m which has only 12 readings in the last 90 days.  His point-of-care A1c is 11% increasing from 8.1%.  During his last visit his average blood glucose is between 140-165.    Recent labs reviewed. - I had a long discussion with him about the progressive nature of diabetes and the pathology behind its complications. -his diabetes is complicated by CVA, nephropathy, and history of heavy alcohol use/abuse and he remains at a high risk for more acute and chronic complications which include CAD, CVA, CKD, retinopathy, and neuropathy. These are all discussed in detail with him.  - I have counseled him on diet  and weight management  by adopting a carbohydrate restricted/protein rich diet. Patient is encouraged to switch to  unprocessed or minimally processed     complex starch and increased protein intake (animal or plant source), fruits, and vegetables. -  he is advised to stick to a routine mealtimes to eat 3 meals  a day and avoid unnecessary snacks ( to snack only to correct hypoglycemia).   - he acknowledges that there is a room for improvement in his food and drink choices. - Suggestion is made for him to avoid simple carbohydrates  from his diet including Cakes, Sweet Desserts, Ice Cream, Soda (diet and regular), Sweet  Tea, Candies, Chips, Cookies, Store Bought Juices, Alcohol in Excess of  1-2 drinks a day, Artificial Sweeteners,  Coffee Creamer, and "Sugar-free" Products, Lemonade. This will help patient to have more stable blood glucose profile and potentially avoid unintended weight gain.   - he will be scheduled with Matthew Harrell, Matthew Harrell, Matthew Harrell for diabetes education.  - I have approached him with the following individualized plan to manage  his diabetes and patient agrees:   -Patient has lost control of glycemia.  With appropriate family help, he would benefit from insulin treatment in order for him to achieve control of diabetes to target.    -I discussed and initiated Tresiba 20 units nightly, associated with monitoring of blood glucose strictly 4 times-before meals and at bedtime and return in 1 week with his meter and logs for evaluation.  -He is not a candidate for metformin.  He is advised to continue glipizide 5 mg XL p.o. daily at breakfast. -Due to unintended weight loss, he was taken off of incretin therapy, as well as SGLT2 inhibitors.  - he is encouraged to call clinic for blood glucose levels less than 70 or above 200 mg /dl x3 in a week fasting.   - Specific targets for  A1c;  LDL, HDL,  and Triglycerides were discussed with the patient.  2)  Blood Pressure /Hypertension:  His blood pressure is controlled to target.  he is advised to continue his current medications including amlodipine 5 mg p.o. daily.  He will be considered for low-dose ACE inhibitor or ARB if no contraindications from nephrology point of view.   3) Lipids/Hyperlipidemia: He does not have recent fasting lipid panel to review.  He is advised to continue atorvastatin 20 mg p.o. nightly.      Side effects and precautions discussed with him.  4)  Weight/Diet:  Body mass index is 26.96 kg/m.  -  he is not a candidate for major weight loss. I discussed with him the fact that loss of 5 - 10% of his  current body weight will  have the most impact on his diabetes management.  Exercise, and detailed carbohydrates information provided  -  detailed on discharge instructions.  5) Chronic Care/Health Maintenance:  -he  Is on Statin medications and  is encouraged to initiate and continue to follow up with Ophthalmology, Dentist,  Podiatrist at least yearly or according to recommendations, and advised to   stay away from smoking. I have recommended yearly flu vaccine and pneumonia vaccine at least every 5 years; moderate intensity exercise for up to 150 minutes weekly; and  sleep for at least 7 hours a day.  - he is  advised to maintain close follow up with Matthew Harrell, Matthew Harrell, Matthew Harrell for primary care needs, as well as his other providers for optimal and coordinated care.    I spent 40 minutes in the care of the patient today including review of labs from CMP, Lipids, Thyroid Function, Hematology (current and previous including abstractions from other facilities); face-to-face time discussing  his blood glucose readings/logs, discussing hypoglycemia and hyperglycemia episodes and symptoms, medications doses, his options of short and long term treatment based on the latest standards of care / guidelines;  discussion about incorporating lifestyle medicine;  and documenting the encounter.    Please refer to Patient Instructions for Blood Glucose Monitoring and Insulin/Medications Dosing Guide"  in media tab for additional information. Please  also refer to " Patient Self Inventory" in the Media  tab for reviewed elements of pertinent patient history.  Matthew Harrell participated in the discussions, expressed understanding, and voiced agreement with the above plans.  All questions were answered to his satisfaction. he is encouraged to contact clinic should he have any questions or concerns prior to his return visit.   Follow up plan: - Return in about 1 week (around 09/05/2021) for Harrell/U with Meter and Logs Only - no Labs.  Matthew LunchGebre Joyice Magda,  Matthew Harrell [facility] [address] [place], [place] [address] Phone: [phone]  Fax: [phone]    08/29/2021, 12:01 PM  This note was partially dictated with voice recognition software. Similar sounding words can be transcribed inadequately or may not  be corrected upon review.

## 2021-08-29 NOTE — Patient Instructions (Signed)

## 2021-09-03 ENCOUNTER — Other Ambulatory Visit: Payer: Self-pay

## 2021-09-03 MED ORDER — CONTOUR NEXT MONITOR W/DEVICE KIT: 1.0000 | PACK | Freq: Four times a day (QID) | 0 refills | Status: DC

## 2021-09-03 MED ORDER — GLUCOSE BLOOD VI STRP: 1.0000 | ORAL_STRIP | Freq: Four times a day (QID) | 5 refills | Status: DC

## 2021-09-03 MED ORDER — LANCETS THIN MISC: 1.0000 | Freq: Four times a day (QID) | 5 refills | Status: DC

## 2021-09-09 ENCOUNTER — Ambulatory Visit: Payer: Medicare Other | Admitting: "Endocrinology

## 2021-09-09 ENCOUNTER — Encounter: Payer: Self-pay | Admitting: "Endocrinology

## 2021-09-09 ENCOUNTER — Other Ambulatory Visit: Payer: Self-pay

## 2021-09-09 VITALS — BP 124/54 | HR 88 | Ht 63.0 in | Wt 155.4 lb

## 2021-09-09 DIAGNOSIS — E1140 Type 2 diabetes mellitus with diabetic neuropathy, unspecified: Secondary | ICD-10-CM

## 2021-09-09 DIAGNOSIS — E785 Hyperlipidemia, unspecified: Secondary | ICD-10-CM | POA: Diagnosis not present

## 2021-09-09 DIAGNOSIS — N1832 Chronic kidney disease, stage 3b: Secondary | ICD-10-CM | POA: Diagnosis not present

## 2021-09-09 DIAGNOSIS — I10 Essential (primary) hypertension: Secondary | ICD-10-CM

## 2021-09-09 DIAGNOSIS — E1122 Type 2 diabetes mellitus with diabetic chronic kidney disease: Secondary | ICD-10-CM

## 2021-09-09 MED ORDER — TRESIBA FLEXTOUCH 100 UNIT/ML ~~LOC~~ SOPN: 20.0000 [IU] | PEN_INJECTOR | Freq: Every day | SUBCUTANEOUS | 1 refills | Status: DC

## 2021-09-09 NOTE — Progress Notes (Signed)
09/09/2021, 6:29 PM  Endocrinology follow-up note   Subjective:    Patient ID: Matthew Harrell, male    DOB: 1941-07-15.  Matthew Harrell is being seen in follow-up after he was seen in consultation for management of currently uncontrolled symptomatic diabetes requested by  [place], MD. He is accompanied by his daughter [name] to clinic today.  [name] is offering to  help.  Past Medical History:  Diagnosis Date   Arthritis    Diabetes ([place])    Diabetes mellitus, type II ([place])    Hypertension    Kidney disease    Stroke (cerebrum) ([place])     Past Surgical History:  Procedure Laterality Date   NASAL SINUS SURGERY     TOTAL HIP ARTHROPLASTY     Left    Social History   Socioeconomic History   Marital status: Divorced    Spouse name: Not on file   Number of children: 3   Years of education: Not on file   Highest education level: Not on file  Occupational History   Occupation: retired  Tobacco Use   Smoking status: Former    Packs/day: 1.00    Years: 10.00    Pack years: 10.00    Types: Cigarettes    Quit date: 12/29/1982    Years since quitting: 38.7   Smokeless tobacco: Never  Substance and Sexual Activity   Alcohol use: No   Drug use: No   Sexual activity: Not on file  Other Topics Concern   Not on file  Social History Narrative   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     Social Determinants of Health   Financial Resource Strain: Not on file  Food Insecurity: Not on file  Transportation Needs: Not on file  Physical Activity: Not on file  Stress: Not on file  Social Connections: Not on file    Family History  Problem Relation Age of Onset   Diabetes Brother    Stomach cancer Sister    Aneurysm Mother 52   Heart attack Father 101    Outpatient Encounter Medications as of 09/09/2021  Medication Sig   Acetaminophen 500 MG capsule Take by mouth as needed for fever.   albuterol (PROVENTIL HFA;VENTOLIN HFA) 108 (90 Base)  MCG/ACT inhaler Inhale 1-2 puffs into the lungs every 6 (six) hours as needed for wheezing or shortness of breath.   amLODipine (NORVASC) 5 MG tablet Take 5 mg by mouth daily.   aspirin 81 MG tablet Take 1 tablet (81 mg total) by mouth daily. (Patient not taking: Reported on 04/24/2021)   atorvastatin (LIPITOR) 20 MG tablet Take 40 mg by mouth daily.   Blood Glucose Monitoring Suppl (CONTOUR NEXT MONITOR) w/Device KIT 1 each by Does not apply route 4 (four) times daily.   clopidogrel (PLAVIX) 75 MG tablet Take 1 tablet by mouth daily.   cyanocobalamin 1000 MCG tablet Take 1 tablet by mouth daily.   diclofenac sodium (VOLTAREN) 1 % GEL Apply 2 g topically 4 (four) times daily as needed (pain).    DULoxetine (CYMBALTA) 30 MG capsule Take 30 mg by mouth every morning.   dutasteride (AVODART) 0.5 MG capsule Take 0.5 mg by mouth every morning.   fluticasone (FLONASE) 50 MCG/ACT nasal spray Place 2 sprays into both nostrils daily.   glipiZIDE (GLUCOTROL XL) 5 MG 24 hr tablet Take 1 tablet (5 mg total) by mouth daily with breakfast.   glucose blood test strip 1 each  by Other route 4 (four) times daily. Use as instructed   HYDROcodone-acetaminophen (NORCO/VICODIN) 5-325 MG tablet Take 1 tablet by mouth every 6 (six) hours as needed for moderate pain. (Patient not taking: Reported on 04/24/2021)   insulin degludec (TRESIBA FLEXTOUCH) 100 UNIT/ML FlexTouch Pen Inject 20 Units into the skin at bedtime.   Insulin Pen Needle (B-D ULTRAFINE III SHORT PEN) 31G X 8 MM MISC 1 each by Does not apply route as directed.   Lancets Thin MISC 1 each by Does not apply route 4 (four) times daily.   LOKELMA 5 g packet SMARTSIG:5 Gram(s) By Mouth Twice a Week   loratadine (CLARITIN) 10 MG tablet Take 10 mg by mouth daily.   montelukast (SINGULAIR) 10 MG tablet Take 1 tablet (10 mg total) by mouth at bedtime. (Patient not taking: Reported on 04/24/2021)   Multiple Vitamins-Minerals (CENTRUM ADULTS) TABS Take 1 tablet by mouth  daily in the afternoon. (Patient not taking: Reported on 04/24/2021)   omeprazole (PRILOSEC) 20 MG capsule Take 1 capsule (20 mg total) by mouth daily.   tamsulosin (FLOMAX) 0.4 MG CAPS capsule Take 0.4 mg by mouth every morning.   traMADol (ULTRAM) 50 MG tablet Take 100 mg by mouth 3 (three) times daily as needed.   [DISCONTINUED] insulin degludec (TRESIBA FLEXTOUCH) 100 UNIT/ML FlexTouch Pen Inject 20 Units into the skin at bedtime.   No facility-administered encounter medications on file as of 09/09/2021.    ALLERGIES: No Known Allergies  VACCINATION STATUS: Immunization History  Administered Date(s) Administered   Influenza Split 09/28/2013   Pneumococcal Polysaccharide-23 12/30/2007    Diabetes He presents for his follow-up diabetic visit. He has type 2 diabetes mellitus. Onset time: He was diagnosed at approximate age of 46 years. His disease course has been improving. There are no hypoglycemic associated symptoms. Pertinent negatives for hypoglycemia include no confusion, headaches, pallor or seizures. Pertinent negatives for diabetes include no chest pain, no fatigue, no polydipsia, no polyphagia, no polyuria, no weakness and no weight loss. There are no hypoglycemic complications. Symptoms are improving. Diabetic complications include a CVA and nephropathy. Risk factors for coronary artery disease include diabetes mellitus, dyslipidemia, family history, male sex, hypertension, sedentary lifestyle and tobacco exposure. Current diabetic treatments: He is currently on Ozempic 0.5 mg subcutaneous weekly.  He was recently taken off of Jardiance and metformin due to CKD. His weight is stable. He is following a generally unhealthy diet. When asked about meal planning, he reported none. He participates in exercise intermittently. His home blood glucose trend is decreasing steadily. His breakfast blood glucose range is generally 130-140 mg/dl. His lunch blood glucose range is generally 140-180  mg/dl. His dinner blood glucose range is generally 140-180 mg/dl. His bedtime blood glucose range is generally 140-180 mg/dl. His overall blood glucose range is 140-180 mg/dl. (He presents with dramatic improvement in his glycemic profile to near target levels.  He has gained 3 pounds since last visit, a good sign.   His recent point-of-care A1c has been 11% increasing from 8.1%.     )  Hyperlipidemia This is a chronic problem. The current episode started more than 1 year ago. Pertinent negatives include no chest pain, myalgias or shortness of breath. Current antihyperlipidemic treatment includes statins. Risk factors for coronary artery disease include dyslipidemia, diabetes mellitus, hypertension and male sex.  Hypertension This is a chronic problem. The current episode started more than 1 year ago. The problem is controlled. Pertinent negatives include no chest pain, headaches, neck pain, palpitations  or shortness of breath. Risk factors for coronary artery disease include dyslipidemia, diabetes mellitus, male gender, sedentary lifestyle and smoking/tobacco exposure. Hypertensive end-organ damage includes CVA.    Review of Systems  Constitutional:  Negative for chills, fatigue, fever, unexpected weight change and weight loss.  HENT:  Negative for dental problem, mouth sores and trouble swallowing.   Eyes:  Negative for visual disturbance.  Respiratory:  Negative for cough, choking, chest tightness, shortness of breath and wheezing.   Cardiovascular:  Negative for chest pain, palpitations and leg swelling.  Gastrointestinal:  Negative for abdominal distention, abdominal pain, constipation, diarrhea, nausea and vomiting.  Endocrine: Negative for polydipsia, polyphagia and polyuria.  Genitourinary:  Negative for dysuria, flank pain, hematuria and urgency.  Musculoskeletal:  Negative for back pain, gait problem, myalgias and neck pain.  Skin:  Negative for pallor, rash and wound.  Neurological:   Negative for seizures, syncope, weakness, numbness and headaches.  Psychiatric/Behavioral:  Negative for confusion and dysphoric mood.    Objective:    Vitals with BMI 09/09/2021 08/29/2021 05/14/2021  Height $Remov'5\' 3"'llWhBN$  $Remove'5\' 3"'wFrhMZo$  $RemoveB'5\' 3"'XCaIxPzi$   Weight 155 lbs 6 oz 152 lbs 3 oz 151 lbs  BMI 27.53 00.17 49.44  Systolic 967 591 638  Diastolic 54 69 75  Pulse 88 80 83    BP (!) 124/54   Pulse 88   Ht $R'5\' 3"'Ck$  (1.6 m)   Wt 155 lb 6.4 oz (70.5 kg)   BMI 27.53 kg/m   Wt Readings from Last 3 Encounters:  09/09/21 155 lb 6.4 oz (70.5 kg)  08/29/21 152 lb 3.2 oz (69 kg)  05/14/21 151 lb (68.5 kg)     Physical Exam Constitutional:      General: He is not in acute distress.    Appearance: He is well-developed.  HENT:     Head: Normocephalic and atraumatic.  Neck:     Thyroid: No thyromegaly.     Trachea: No tracheal deviation.  Cardiovascular:     Rate and Rhythm: Normal rate.     Pulses:          Dorsalis pedis pulses are 1+ on the right side and 1+ on the left side.       Posterior tibial pulses are 1+ on the right side and 1+ on the left side.     Heart sounds: Normal heart sounds, S1 normal and S2 normal. No murmur heard.   No gallop.  Pulmonary:     Effort: Pulmonary effort is normal. No respiratory distress.     Breath sounds: No wheezing.  Abdominal:     General: Abdomen is flat. There is no distension.     Palpations: Abdomen is soft.     Tenderness: There is no abdominal tenderness. There is no guarding.  Musculoskeletal:     Right shoulder: No swelling or deformity.     Cervical back: Normal range of motion and neck supple.     Comments: Normal monofilament test on bilateral lower extremities.  Skin:    General: Skin is warm and dry.     Findings: No rash.     Nails: There is no clubbing.  Neurological:     Mental Status: He is alert and oriented to person, place, and time.     Cranial Nerves: No cranial nerve deficit.     Sensory: No sensory deficit.     Gait: Gait normal.      Deep Tendon Reflexes: Reflexes are normal and symmetric.  Psychiatric:  Speech: Speech normal.        Behavior: Behavior normal. Behavior is cooperative.        Thought Content: Thought content normal.        Judgment: Judgment normal.      CMP ( most recent) CMP     Component Value Date/Time   NA 139 08/23/2021 0000   K 5.5 (A) 08/23/2021 0000   CL 101 08/23/2021 0000   CO2 21 08/23/2021 0000   GLUCOSE 102 (H) 08/27/2018 0418   BUN 37 (A) 08/23/2021 0000   CREATININE 2.3 (A) 08/23/2021 0000   CREATININE 1.55 (H) 08/27/2018 0418   CALCIUM 8.8 08/23/2021 0000   PROT 8.2 (H) 08/23/2018 2025   ALBUMIN 4.3 08/23/2021 0000   AST 13 (A) 08/23/2021 0000   ALT 5 (A) 08/23/2021 0000   ALKPHOS 137 (A) 08/23/2021 0000   BILITOT 0.9 08/23/2018 2025   GFRNONAA 28 08/23/2021 0000   GFRAA 48 (L) 08/27/2018 0418     Diabetic Labs (most recent): Lab Results  Component Value Date   HGBA1C 11 08/29/2021   HGBA1C 8.1 04/12/2021   HGBA1C 7.9 03/20/2021     Assessment & Plan:   1. Type 2 diabetes mellitus with stage 3b chronic kidney disease, without long-term current use of insulin ([place])  - Matthew Harrell has currently uncontrolled symptomatic type 2 DM since  80 years of age.  He presents with dramatic improvement in his glycemic profile to near target levels.  He has gained 3 pounds since last visit, a good sign.   His recent point-of-care A1c has been 11% increasing from 8.1%.      Recent labs reviewed. - I had a long discussion with him about the progressive nature of diabetes and the pathology behind its complications. -his diabetes is complicated by CVA, nephropathy, and history of heavy alcohol use/abuse and he remains at a high risk for more acute and chronic complications which include CAD, CVA, CKD, retinopathy, and neuropathy. These are all discussed in detail with him.  - I have counseled him on diet  and weight management  by adopting a carbohydrate  restricted/protein rich diet. Patient is encouraged to switch to  unprocessed or minimally processed     complex starch and increased protein intake (animal or plant source), fruits, and vegetables. -  he is advised to stick to a routine mealtimes to eat 3 meals  a day and avoid unnecessary snacks ( to snack only to correct hypoglycemia).   - he acknowledges that there is a room for improvement in his food and drink choices. - Suggestion is made for him to avoid simple carbohydrates  from his diet including Cakes, Sweet Desserts, Ice Cream, Soda (diet and regular), Sweet Tea, Candies, Chips, Cookies, Store Bought Juices, Alcohol in Excess of  1-2 drinks a day, Artificial Sweeteners,  Coffee Creamer, and "Sugar-free" Products, Lemonade. This will help patient to have more stable blood glucose profile and potentially avoid unintended weight gain.   - he will be scheduled with Matthew Harrell, Matthew Harrell, Matthew Harrell for diabetes education.  - I have approached him with the following individualized plan to manage  his diabetes and patient agrees:   -Patient has benefited from low-dose basal insulin.  He is advised to continue Tresiba 20 units nightly, associated with monitoring of blood glucose twice a day-daily before breakfast and at bedtime.   - he is encouraged to call clinic for blood glucose levels less than 70 or above 200 mg /dl x3  in a week fasting.  -He is not a candidate for metformin.  He will continue to benefit from low-dose glipizide, 5 mg XL p.o. daily at breakfast.   -Due to unintended weight loss, he was taken off of incretin therapy, as well as SGLT2 inhibitors.     - Specific targets for  A1c;  LDL, HDL,  and Triglycerides were discussed with the patient.  2) Blood Pressure /Hypertension:  His blood pressure is controlled to target.  he is advised to continue his current medications including amlodipine 5 mg p.o. daily.  He will be considered for low-dose ACE inhibitor or ARB if no  contraindications from nephrology point of view.   3) Lipids/Hyperlipidemia: He does not have recent fasting lipid panel to review.  He is advised to continue atorvastatin 20 mg p.o. nightly.      Side effects and precautions discussed with him.  4)  Weight/Diet:  Body mass index is 27.53 kg/m.  -  he is not a candidate for major weight loss. I discussed with him the fact that loss of 5 - 10% of his  current body weight will have the most impact on his diabetes management.  Exercise, and detailed carbohydrates information provided  -  detailed on discharge instructions.  5) Chronic Care/Health Maintenance:  -he  Is on Statin medications and  is encouraged to initiate and continue to follow up with Ophthalmology, Dentist,  Podiatrist at least yearly or according to recommendations, and advised to   stay away from smoking. I have recommended yearly flu vaccine and pneumonia vaccine at least every 5 years; moderate intensity exercise for up to 150 minutes weekly; and  sleep for at least 7 hours a day.  - he is  advised to maintain close follow up with [place], MD for primary care needs, as well as his other providers for optimal and coordinated care.   I spent 31 minutes in the care of the patient today including review of labs from [facility], Lipids, Thyroid Function, Hematology (current and previous including abstractions from other facilities); face-to-face time discussing  his blood glucose readings/logs, discussing hypoglycemia and hyperglycemia episodes and symptoms, medications doses, his options of short and long term treatment based on the latest standards of care / guidelines;  discussion about incorporating lifestyle medicine;  and documenting the encounter.    Please refer to Patient Instructions for Blood Glucose Monitoring and Insulin/Medications Dosing Guide"  in media tab for additional information. Please  also refer to " Patient Self Inventory" in the Media  tab for reviewed  elements of pertinent patient history.  Matthew Harrell participated in the discussions, expressed understanding, and voiced agreement with the above plans.  All questions were answered to his satisfaction. he is encouraged to contact clinic should he have any questions or concerns prior to his return visit.  Follow up plan: - Return in about 3 months (around 12/09/2021) for Bring Meter and Logs- A1c in Office.  Glade Lloyd, MD [facility] [address] [place], [place] [address] Phone: [phone]  Fax: [phone]    09/09/2021, 6:29 PM  This note was partially dictated with voice recognition software. Similar sounding words can be transcribed inadequately or may not  be corrected upon review.

## 2021-09-09 NOTE — Patient Instructions (Signed)

## 2021-09-16 DIAGNOSIS — E1169 Type 2 diabetes mellitus with other specified complication: Secondary | ICD-10-CM | POA: Diagnosis not present

## 2021-09-16 DIAGNOSIS — E875 Hyperkalemia: Secondary | ICD-10-CM | POA: Diagnosis not present

## 2021-09-16 DIAGNOSIS — K219 Gastro-esophageal reflux disease without esophagitis: Secondary | ICD-10-CM | POA: Diagnosis not present

## 2021-09-16 DIAGNOSIS — E7800 Pure hypercholesterolemia, unspecified: Secondary | ICD-10-CM | POA: Diagnosis not present

## 2021-09-16 DIAGNOSIS — N1831 Chronic kidney disease, stage 3a: Secondary | ICD-10-CM | POA: Diagnosis not present

## 2021-09-18 DIAGNOSIS — E875 Hyperkalemia: Secondary | ICD-10-CM | POA: Diagnosis not present

## 2021-09-18 DIAGNOSIS — E559 Vitamin D deficiency, unspecified: Secondary | ICD-10-CM | POA: Diagnosis not present

## 2021-09-18 DIAGNOSIS — E1122 Type 2 diabetes mellitus with diabetic chronic kidney disease: Secondary | ICD-10-CM | POA: Diagnosis not present

## 2021-09-18 DIAGNOSIS — I129 Hypertensive chronic kidney disease with stage 1 through stage 4 chronic kidney disease, or unspecified chronic kidney disease: Secondary | ICD-10-CM | POA: Diagnosis not present

## 2021-09-18 DIAGNOSIS — N170 Acute kidney failure with tubular necrosis: Secondary | ICD-10-CM | POA: Diagnosis not present

## 2021-09-18 DIAGNOSIS — N189 Chronic kidney disease, unspecified: Secondary | ICD-10-CM | POA: Diagnosis not present

## 2021-09-23 DIAGNOSIS — I6930 Unspecified sequelae of cerebral infarction: Secondary | ICD-10-CM | POA: Diagnosis not present

## 2021-09-23 DIAGNOSIS — Z79891 Long term (current) use of opiate analgesic: Secondary | ICD-10-CM | POA: Diagnosis not present

## 2021-09-23 DIAGNOSIS — R269 Unspecified abnormalities of gait and mobility: Secondary | ICD-10-CM | POA: Diagnosis not present

## 2021-09-23 DIAGNOSIS — I69919 Unspecified symptoms and signs involving cognitive functions following unspecified cerebrovascular disease: Secondary | ICD-10-CM | POA: Diagnosis not present

## 2021-09-23 DIAGNOSIS — E1142 Type 2 diabetes mellitus with diabetic polyneuropathy: Secondary | ICD-10-CM | POA: Diagnosis not present

## 2021-09-23 DIAGNOSIS — M130 Polyarthritis, unspecified: Secondary | ICD-10-CM | POA: Diagnosis not present

## 2021-10-28 DIAGNOSIS — E1122 Type 2 diabetes mellitus with diabetic chronic kidney disease: Secondary | ICD-10-CM | POA: Diagnosis not present

## 2021-10-28 DIAGNOSIS — N1831 Chronic kidney disease, stage 3a: Secondary | ICD-10-CM | POA: Diagnosis not present

## 2021-10-28 DIAGNOSIS — E7849 Other hyperlipidemia: Secondary | ICD-10-CM | POA: Diagnosis not present

## 2021-10-28 DIAGNOSIS — I129 Hypertensive chronic kidney disease with stage 1 through stage 4 chronic kidney disease, or unspecified chronic kidney disease: Secondary | ICD-10-CM | POA: Diagnosis not present

## 2021-11-07 ENCOUNTER — Other Ambulatory Visit: Payer: Self-pay | Admitting: "Endocrinology

## 2021-12-11 ENCOUNTER — Ambulatory Visit: Payer: Medicare Other | Admitting: "Endocrinology

## 2021-12-30 DIAGNOSIS — G459 Transient cerebral ischemic attack, unspecified: Secondary | ICD-10-CM | POA: Diagnosis not present

## 2021-12-30 DIAGNOSIS — I129 Hypertensive chronic kidney disease with stage 1 through stage 4 chronic kidney disease, or unspecified chronic kidney disease: Secondary | ICD-10-CM | POA: Diagnosis not present

## 2021-12-30 DIAGNOSIS — E1122 Type 2 diabetes mellitus with diabetic chronic kidney disease: Secondary | ICD-10-CM | POA: Diagnosis not present

## 2021-12-30 DIAGNOSIS — E875 Hyperkalemia: Secondary | ICD-10-CM | POA: Diagnosis not present

## 2021-12-31 ENCOUNTER — Encounter: Payer: Self-pay | Admitting: "Endocrinology

## 2021-12-31 ENCOUNTER — Ambulatory Visit (INDEPENDENT_AMBULATORY_CARE_PROVIDER_SITE_OTHER): Payer: Medicare Other | Admitting: "Endocrinology

## 2021-12-31 ENCOUNTER — Other Ambulatory Visit: Payer: Self-pay

## 2021-12-31 VITALS — BP 122/60 | HR 76 | Ht 63.0 in | Wt 165.0 lb

## 2021-12-31 DIAGNOSIS — E782 Mixed hyperlipidemia: Secondary | ICD-10-CM | POA: Diagnosis not present

## 2021-12-31 DIAGNOSIS — I10 Essential (primary) hypertension: Secondary | ICD-10-CM

## 2021-12-31 DIAGNOSIS — E875 Hyperkalemia: Secondary | ICD-10-CM | POA: Diagnosis not present

## 2021-12-31 DIAGNOSIS — G459 Transient cerebral ischemic attack, unspecified: Secondary | ICD-10-CM | POA: Diagnosis not present

## 2021-12-31 DIAGNOSIS — E1122 Type 2 diabetes mellitus with diabetic chronic kidney disease: Secondary | ICD-10-CM | POA: Diagnosis not present

## 2021-12-31 DIAGNOSIS — N1832 Chronic kidney disease, stage 3b: Secondary | ICD-10-CM | POA: Diagnosis not present

## 2021-12-31 DIAGNOSIS — I129 Hypertensive chronic kidney disease with stage 1 through stage 4 chronic kidney disease, or unspecified chronic kidney disease: Secondary | ICD-10-CM | POA: Diagnosis not present

## 2021-12-31 MED ORDER — TRESIBA FLEXTOUCH 100 UNIT/ML ~~LOC~~ SOPN: 24.0000 [IU] | PEN_INJECTOR | Freq: Every day | SUBCUTANEOUS | 1 refills | Status: DC

## 2021-12-31 NOTE — Patient Instructions (Signed)

## 2021-12-31 NOTE — Progress Notes (Signed)
12/31/2021, 4:31 PM  Endocrinology follow-up note   Subjective:    Patient ID: Matthew Harrell, male    DOB: 01-25-1941.  Matthew Harrell is being seen in follow-up after he was seen in consultation for management of currently uncontrolled symptomatic diabetes requested by   Nation, MD. He is accompanied by his daughter [name] to clinic today.  [name] is offering to  help.  Past Medical History:  Diagnosis Date   Arthritis    Diabetes ([place])    Diabetes mellitus, type II ([place])    Hypertension    Kidney disease    Stroke (cerebrum) ([place])     Past Surgical History:  Procedure Laterality Date   NASAL SINUS SURGERY     TOTAL HIP ARTHROPLASTY     Left    Social History   Socioeconomic History   Marital status: Divorced    Spouse name: Not on file   Number of children: 3   Years of education: Not on file   Highest education level: Not on file  Occupational History   Occupation: retired  Tobacco Use   Smoking status: Former    Packs/day: 1.00    Years: 10.00    Pack years: 10.00    Types: Cigarettes    Quit date: 12/29/1982    Years since quitting: 39.0   Smokeless tobacco: Never  Substance and Sexual Activity   Alcohol use: No   Drug use: No   Sexual activity: Not on file  Other Topics Concern   Not on file  Social History Narrative   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     Social Determinants of Health   Financial Resource Strain: Not on file  Food Insecurity: Not on file  Transportation Needs: Not on file  Physical Activity: Not on file  Stress: Not on file  Social Connections: Not on file    Family History  Problem Relation Age of Onset   Diabetes Brother    Stomach cancer Sister    Aneurysm Mother 19   Heart attack Father 56    Outpatient Encounter Medications as of 12/31/2021  Medication Sig   Acetaminophen 500 MG capsule Take by mouth as needed for fever.   albuterol (PROVENTIL HFA;VENTOLIN HFA) 108 (90 Base)  MCG/ACT inhaler Inhale 1-2 puffs into the lungs every 6 (six) hours as needed for wheezing or shortness of breath.   amLODipine (NORVASC) 5 MG tablet Take 5 mg by mouth daily.   atorvastatin (LIPITOR) 40 MG tablet Take 1 tablet by mouth daily.   Blood Glucose Monitoring Suppl (CONTOUR NEXT MONITOR) w/Device KIT 1 each by Does not apply route 4 (four) times daily.   clopidogrel (PLAVIX) 75 MG tablet Take 1 tablet by mouth daily.   cyanocobalamin 1000 MCG tablet Take 1 tablet by mouth daily.   diclofenac sodium (VOLTAREN) 1 % GEL Apply 2 g topically 4 (four) times daily as needed (pain).    DULoxetine (CYMBALTA) 30 MG capsule Take 30 mg by mouth 2 (two) times daily.   dutasteride (AVODART) 0.5 MG capsule Take 0.5 mg by mouth every morning.   fluticasone (FLONASE) 50 MCG/ACT nasal spray Place 2 sprays into both nostrils daily.   glipiZIDE (GLUCOTROL XL) 5 MG 24 hr tablet TAKE ONE TABLET BY MOUTH EVERY MORNING WITH BREAKFAST   glucose blood test strip 1 each by Other route 4 (four) times daily. Use as instructed   HYDROcodone-acetaminophen (NORCO/VICODIN) 5-325 MG tablet Take 1 tablet by  mouth every 6 (six) hours as needed for moderate pain. (Patient not taking: Reported on 04/24/2021)   insulin degludec (TRESIBA FLEXTOUCH) 100 UNIT/ML FlexTouch Pen Inject 24 Units into the skin at bedtime.   Insulin Pen Needle (B-D ULTRAFINE III SHORT PEN) 31G X 8 MM MISC 1 each by Does not apply route as directed.   Lancets Thin MISC 1 each by Does not apply route 4 (four) times daily.   LOKELMA 5 g packet SMARTSIG:5 Gram(s) By Mouth Twice a Week   omeprazole (PRILOSEC) 20 MG capsule Take 1 capsule (20 mg total) by mouth daily.   tamsulosin (FLOMAX) 0.4 MG CAPS capsule Take 0.4 mg by mouth every morning.   traMADol (ULTRAM) 50 MG tablet Take 100 mg by mouth 3 (three) times daily as needed.   [DISCONTINUED] aspirin 81 MG tablet Take 1 tablet (81 mg total) by mouth daily. (Patient not taking: Reported on 04/24/2021)    [DISCONTINUED] atorvastatin (LIPITOR) 20 MG tablet Take 40 mg by mouth daily.   [DISCONTINUED] insulin degludec (TRESIBA FLEXTOUCH) 100 UNIT/ML FlexTouch Pen Inject 20 Units into the skin at bedtime.   [DISCONTINUED] loratadine (CLARITIN) 10 MG tablet Take 10 mg by mouth daily.   [DISCONTINUED] montelukast (SINGULAIR) 10 MG tablet Take 1 tablet (10 mg total) by mouth at bedtime. (Patient not taking: Reported on 04/24/2021)   [DISCONTINUED] Multiple Vitamins-Minerals (CENTRUM ADULTS) TABS Take 1 tablet by mouth daily in the afternoon. (Patient not taking: Reported on 04/24/2021)   No facility-administered encounter medications on file as of 12/31/2021.    ALLERGIES: No Known Allergies  VACCINATION STATUS: Immunization History  Administered Date(s) Administered   Influenza Split 09/28/2013   Pneumococcal Polysaccharide-23 12/30/2007    Diabetes He presents for his follow-up diabetic visit. He has type 2 diabetes mellitus. Onset time: He was diagnosed at approximate age of 6 years. His disease course has been improving. There are no hypoglycemic associated symptoms. Pertinent negatives for hypoglycemia include no confusion, headaches, pallor or seizures. Pertinent negatives for diabetes include no chest pain, no fatigue, no polydipsia, no polyphagia, no polyuria, no weakness and no weight loss. There are no hypoglycemic complications. Symptoms are improving. Diabetic complications include a CVA and nephropathy. Risk factors for coronary artery disease include diabetes mellitus, dyslipidemia, family history, male sex, hypertension, sedentary lifestyle and tobacco exposure. Current diabetic treatments: He is currently on Ozempic 0.5 mg subcutaneous weekly.  He was recently taken off of Jardiance and metformin due to CKD. His weight is increasing steadily. He is following a generally unhealthy diet. When asked about meal planning, he reported none. He participates in exercise intermittently. His home  blood glucose trend is decreasing steadily. His breakfast blood glucose range is generally 140-180 mg/dl. His bedtime blood glucose range is generally 180-200 mg/dl. His overall blood glucose range is 180-200 mg/dl. (He presents with continued improvement in his glycemic profile.  He is accompanied by his son-in-law today.  His average blood glucose has been 161-180 for the last 30 days.  His point-of-care A1c is 8%, improving from 11% during his last visit.  No documented hypoglycemia.  )  Hyperlipidemia This is a chronic problem. The current episode started more than 1 year ago. Pertinent negatives include no chest pain, myalgias or shortness of breath. Current antihyperlipidemic treatment includes statins. Risk factors for coronary artery disease include dyslipidemia, diabetes mellitus, hypertension and male sex.  Hypertension This is a chronic problem. The current episode started more than 1 year ago. The problem is controlled. Pertinent negatives  include no chest pain, headaches, neck pain, palpitations or shortness of breath. Risk factors for coronary artery disease include dyslipidemia, diabetes mellitus, male gender, sedentary lifestyle and smoking/tobacco exposure. Hypertensive end-organ damage includes CVA.    Review of Systems  Constitutional:  Negative for chills, fatigue, fever, unexpected weight change and weight loss.  HENT:  Negative for dental problem, mouth sores and trouble swallowing.   Eyes:  Negative for visual disturbance.  Respiratory:  Negative for cough, choking, chest tightness, shortness of breath and wheezing.   Cardiovascular:  Negative for chest pain, palpitations and leg swelling.  Gastrointestinal:  Negative for abdominal distention, abdominal pain, constipation, diarrhea, nausea and vomiting.  Endocrine: Negative for polydipsia, polyphagia and polyuria.  Genitourinary:  Negative for dysuria, flank pain, hematuria and urgency.  Musculoskeletal:  Negative for back  pain, gait problem, myalgias and neck pain.  Skin:  Negative for pallor, rash and wound.  Neurological:  Negative for seizures, syncope, weakness, numbness and headaches.  Psychiatric/Behavioral:  Negative for confusion and dysphoric mood.    Objective:    Vitals with BMI 12/31/2021 09/09/2021 08/29/2021  Height _0  _1  _2   Weight 165 lbs 155 lbs 6 oz 152 lbs 3 oz  BMI 29.24 56.38 75.64  Systolic 332 951 884  Diastolic 60 54 69  Pulse 76 88 80    BP 122/60    Pulse 76    Ht _3  (1.6 m)    Wt 165 lb (74.8 kg)    BMI 29.23 kg/m   Wt Readings from Last 3 Encounters:  12/31/21 165 lb (74.8 kg)  09/09/21 155 lb 6.4 oz (70.5 kg)  08/29/21 152 lb 3.2 oz (69 kg)     Physical Exam Constitutional:      General: He is not in acute distress.    Appearance: He is well-developed.  HENT:     Head: Normocephalic and atraumatic.  Neck:     Thyroid: No thyromegaly.     Trachea: No tracheal deviation.  Cardiovascular:     Rate and Rhythm: Normal rate.     Pulses:          Dorsalis pedis pulses are 1+ on the right side and 1+ on the left side.       Posterior tibial pulses are 1+ on the right side and 1+ on the left side.     Heart sounds: Normal heart sounds, S1 normal and S2 normal. No murmur heard.   No gallop.  Pulmonary:     Effort: Pulmonary effort is normal. No respiratory distress.     Breath sounds: No wheezing.  Abdominal:     General: Abdomen is flat. There is no distension.     Palpations: Abdomen is soft.     Tenderness: There is no abdominal tenderness. There is no guarding.  Musculoskeletal:     Right shoulder: No swelling or deformity.     Cervical back: Normal range of motion and neck supple.     Comments: Normal monofilament test on bilateral lower extremities.  Skin:    General: Skin is warm and dry.     Findings: No rash.     Nails: There is no clubbing.  Neurological:     Mental Status: He is alert and oriented to person, place, and time.     Cranial  Nerves: No cranial nerve deficit.     Sensory: No sensory deficit.     Gait: Gait normal.     Deep Tendon Reflexes: Reflexes  are normal and symmetric.  Psychiatric:        Speech: Speech normal.        Behavior: Behavior normal. Behavior is cooperative.        Thought Content: Thought content normal.        Judgment: Judgment normal.      CMP ( most recent) CMP     Component Value Date/Time   NA 139 08/23/2021 0000   K 5.5 (A) 08/23/2021 0000   CL 101 08/23/2021 0000   CO2 21 08/23/2021 0000   GLUCOSE 102 (H) 08/27/2018 0418   BUN 37 (A) 08/23/2021 0000   CREATININE 2.3 (A) 08/23/2021 0000   CREATININE 1.55 (H) 08/27/2018 0418   CALCIUM 8.8 08/23/2021 0000   PROT 8.2 (H) 08/23/2018 2025   ALBUMIN 4.3 08/23/2021 0000   AST 13 (A) 08/23/2021 0000   ALT 5 (A) 08/23/2021 0000   ALKPHOS 137 (A) 08/23/2021 0000   BILITOT 0.9 08/23/2018 2025   GFRNONAA 28 08/23/2021 0000   GFRAA 48 (L) 08/27/2018 0418     Diabetic Labs (most recent): Lab Results  Component Value Date   HGBA1C 11 08/29/2021   HGBA1C 8.1 04/12/2021   HGBA1C 7.9 03/20/2021     Assessment & Plan:   1. Type 2 diabetes mellitus with stage 3b chronic kidney disease, without long-term current use of insulin ([place])  - Matthew Harrell has currently uncontrolled symptomatic type 2 DM since  81 years of age.  He presents with continued improvement in his glycemic profile.  He is accompanied by his son-in-law today.  His average blood glucose has been 161-180 for the last 30 days.  His point-of-care A1c is 8%, improving from 11% during his last visit.  No documented hypoglycemia.      Recent labs reviewed. - I had a long discussion with him about the progressive nature of diabetes and the pathology behind its complications. -his diabetes is complicated by CVA, nephropathy, and history of heavy alcohol use/abuse and he remains at a high risk for more acute and chronic complications which include CAD, CVA, CKD,  retinopathy, and neuropathy. These are all discussed in detail with him.  - I have counseled him on diet  and weight management  by adopting a carbohydrate restricted/protein rich diet. Patient is encouraged to switch to  unprocessed or minimally processed     complex starch and increased protein intake (animal or plant source), fruits, and vegetables. -  he is advised to stick to a routine mealtimes to eat 3 meals  a day and avoid unnecessary snacks ( to snack only to correct hypoglycemia).  - he acknowledges that there is a room for improvement in his food and drink choices. - Suggestion is made for him to avoid simple carbohydrates  from his diet including Cakes, Sweet Desserts, Ice Cream, Soda (diet and regular), Sweet Tea, Candies, Chips, Cookies, Store Bought Juices, Alcohol in Excess of  1-2 drinks a day, Artificial Sweeteners,  Coffee Creamer, and "Sugar-free" Products, Lemonade. This will help patient to have more stable blood glucose profile and potentially avoid unintended weight gain.   - he will be scheduled with Jearld Fenton, RDN, CDE for diabetes education.  - I have approached him with the following individualized plan to manage  his diabetes and patient agrees:   -Patient has benefited from low-dose basal insulin.  In order for him to achieve better glycemic profile, he is advised to increase his [place] to 24 units nightly,  associated with monitoring of blood glucose twice a day-daily before breakfast and at bedtime.   - he is encouraged to call clinic for blood glucose levels less than 70 or above 200 mg /dl x3 in a week fasting.  -He is not a candidate for metformin.  He will continue to benefit from low-dose glipizide, 5 mg XL p.o. daily at breakfast.   -Due to unintended weight loss, he was taken off of incretin therapy, as well as SGLT2 inhibitors.  - Specific targets for  A1c;  LDL, HDL,  and Triglycerides were discussed with the patient.  2) Blood Pressure  /Hypertension:  -His blood pressure is controlled to target.  he is advised to continue his current medications including amlodipine 5 mg p.o. daily.  He will be considered for low-dose ACE inhibitor or ARB if no contraindications from nephrology point of view.   3) Lipids/Hyperlipidemia: He does not have recent fasting lipid panel to review.  He is advised to continue atorvastatin 20 mg p.o. nightly, side effects and precautions discussed with him.    4)  Weight/Diet:  Body mass index is 29.23 kg/m.  -  he is not a candidate for major weight loss. I discussed with him the fact that loss of 5 - 10% of his  current body weight will have the most impact on his diabetes management.  Exercise, and detailed carbohydrates information provided  -  detailed on discharge instructions.  5) Chronic Care/Health Maintenance:  -he  Is on Statin medications and  is encouraged to initiate and continue to follow up with Ophthalmology, Dentist,  Podiatrist at least yearly or according to recommendations, and advised to   stay away from smoking. I have recommended yearly flu vaccine and pneumonia vaccine at least every 5 years; moderate intensity exercise for up to 150 minutes weekly; and  sleep for at least 7 hours a day.  - he is  advised to maintain close follow up with Poynette Nation, MD for primary care needs, as well as his other providers for optimal and coordinated care.   I spent 34 minutes in the care of the patient today including review of labs from [place], Lipids, Thyroid Function, Hematology (current and previous including abstractions from other facilities); face-to-face time discussing  his blood glucose readings/logs, discussing hypoglycemia and hyperglycemia episodes and symptoms, medications doses, his options of short and long term treatment based on the latest standards of care / guidelines;  discussion about incorporating lifestyle medicine;  and documenting the encounter.    Please refer to  Patient Instructions for Blood Glucose Monitoring and Insulin/Medications Dosing Guide"  in media tab for additional information. Please  also refer to " Patient Self Inventory" in the Media  tab for reviewed elements of pertinent patient history.  Matthew Harrell participated in the Harrell, expressed understanding, and voiced agreement with the above plans.  All questions were answered to his satisfaction. he is encouraged to contact clinic should he have any questions or concerns prior to his return visit.   Follow up plan: - Return in about 4 months (around 04/30/2022) for F/U with Pre-visit Labs, Meter, Logs, A1c here.Glade Lloyd, MD [facility] [address] [place], [place] [address] Phone: [phone]  Fax: [phone]    12/31/2021, 4:31 PM  This note was partially dictated with voice recognition software. Similar sounding words can be transcribed inadequately or may not  be corrected upon review.

## 2022-01-03 DIAGNOSIS — E875 Hyperkalemia: Secondary | ICD-10-CM | POA: Diagnosis not present

## 2022-01-03 DIAGNOSIS — E1122 Type 2 diabetes mellitus with diabetic chronic kidney disease: Secondary | ICD-10-CM | POA: Diagnosis not present

## 2022-01-03 DIAGNOSIS — I129 Hypertensive chronic kidney disease with stage 1 through stage 4 chronic kidney disease, or unspecified chronic kidney disease: Secondary | ICD-10-CM | POA: Diagnosis not present

## 2022-01-03 DIAGNOSIS — N189 Chronic kidney disease, unspecified: Secondary | ICD-10-CM | POA: Diagnosis not present

## 2022-01-03 DIAGNOSIS — D631 Anemia in chronic kidney disease: Secondary | ICD-10-CM | POA: Diagnosis not present

## 2022-01-26 DIAGNOSIS — E1122 Type 2 diabetes mellitus with diabetic chronic kidney disease: Secondary | ICD-10-CM | POA: Diagnosis not present

## 2022-01-26 DIAGNOSIS — I129 Hypertensive chronic kidney disease with stage 1 through stage 4 chronic kidney disease, or unspecified chronic kidney disease: Secondary | ICD-10-CM | POA: Diagnosis not present

## 2022-02-12 DIAGNOSIS — N1831 Chronic kidney disease, stage 3a: Secondary | ICD-10-CM | POA: Diagnosis not present

## 2022-02-12 DIAGNOSIS — E1169 Type 2 diabetes mellitus with other specified complication: Secondary | ICD-10-CM | POA: Diagnosis not present

## 2022-02-12 DIAGNOSIS — Z23 Encounter for immunization: Secondary | ICD-10-CM | POA: Diagnosis not present

## 2022-02-12 DIAGNOSIS — E875 Hyperkalemia: Secondary | ICD-10-CM | POA: Diagnosis not present

## 2022-02-12 DIAGNOSIS — G629 Polyneuropathy, unspecified: Secondary | ICD-10-CM | POA: Diagnosis not present

## 2022-02-12 DIAGNOSIS — R809 Proteinuria, unspecified: Secondary | ICD-10-CM | POA: Diagnosis not present

## 2022-02-12 DIAGNOSIS — K219 Gastro-esophageal reflux disease without esophagitis: Secondary | ICD-10-CM | POA: Diagnosis not present

## 2022-02-12 DIAGNOSIS — E7800 Pure hypercholesterolemia, unspecified: Secondary | ICD-10-CM | POA: Diagnosis not present

## 2022-02-12 DIAGNOSIS — I10 Essential (primary) hypertension: Secondary | ICD-10-CM | POA: Diagnosis not present

## 2022-04-10 ENCOUNTER — Other Ambulatory Visit: Payer: Self-pay | Admitting: "Endocrinology

## 2022-04-25 LAB — LIPID PANEL
Cholesterol: 125 (ref 0–200)
HDL: 38 (ref 35–70)
LDL Cholesterol: 71
Triglycerides: 81 (ref 40–160)

## 2022-04-25 LAB — TSH: TSH: 1.82 (ref 0.41–5.90)

## 2022-04-25 LAB — HEPATIC FUNCTION PANEL
ALT: 7 U/L — AB (ref 10–40)
AST: 19 (ref 14–40)
Alkaline Phosphatase: 165 — AB (ref 25–125)
Bilirubin, Total: 0.5

## 2022-04-25 LAB — BASIC METABOLIC PANEL
BUN: 39 — AB (ref 4–21)
CO2: 26 — AB (ref 13–22)
Chloride: 105 (ref 99–108)
Creatinine: 1.8 — AB (ref 0.6–1.3)
Glucose: 111
Potassium: 5.1 mEq/L (ref 3.5–5.1)
Sodium: 145 (ref 137–147)

## 2022-04-25 LAB — COMPREHENSIVE METABOLIC PANEL
Albumin: 4.5 (ref 3.5–5.0)
Calcium: 9.2 (ref 8.7–10.7)
Globulin: 2

## 2022-04-30 ENCOUNTER — Ambulatory Visit: Payer: Medicare Other | Admitting: "Endocrinology

## 2022-05-01 ENCOUNTER — Other Ambulatory Visit: Payer: Self-pay | Admitting: "Endocrinology

## 2022-05-06 ENCOUNTER — Other Ambulatory Visit: Payer: Self-pay | Admitting: "Endocrinology

## 2022-05-07 ENCOUNTER — Ambulatory Visit: Payer: Medicare Other | Admitting: "Endocrinology

## 2022-05-13 ENCOUNTER — Ambulatory Visit: Payer: Medicare Other | Admitting: "Endocrinology

## 2022-05-29 DIAGNOSIS — K1120 Sialoadenitis, unspecified: Secondary | ICD-10-CM | POA: Diagnosis not present

## 2022-06-04 ENCOUNTER — Other Ambulatory Visit: Payer: Self-pay | Admitting: "Endocrinology

## 2022-06-06 DIAGNOSIS — M25552 Pain in left hip: Secondary | ICD-10-CM | POA: Diagnosis not present

## 2022-06-27 DIAGNOSIS — N1831 Chronic kidney disease, stage 3a: Secondary | ICD-10-CM | POA: Diagnosis not present

## 2022-06-27 DIAGNOSIS — E7849 Other hyperlipidemia: Secondary | ICD-10-CM | POA: Diagnosis not present

## 2022-06-27 DIAGNOSIS — E1122 Type 2 diabetes mellitus with diabetic chronic kidney disease: Secondary | ICD-10-CM | POA: Diagnosis not present

## 2022-07-21 DIAGNOSIS — E559 Vitamin D deficiency, unspecified: Secondary | ICD-10-CM | POA: Diagnosis not present

## 2022-07-21 DIAGNOSIS — I10 Essential (primary) hypertension: Secondary | ICD-10-CM | POA: Diagnosis not present

## 2022-07-21 DIAGNOSIS — E875 Hyperkalemia: Secondary | ICD-10-CM | POA: Diagnosis not present

## 2022-07-21 DIAGNOSIS — E1122 Type 2 diabetes mellitus with diabetic chronic kidney disease: Secondary | ICD-10-CM | POA: Diagnosis not present

## 2022-07-21 LAB — COMPREHENSIVE METABOLIC PANEL
Albumin: 4.4 (ref 3.5–5.0)
Calcium: 9 (ref 8.7–10.7)
Globulin: 2.2

## 2022-07-21 LAB — BASIC METABOLIC PANEL
BUN: 36 — AB (ref 4–21)
CO2: 23 — AB (ref 13–22)
Chloride: 104 (ref 99–108)
Creatinine: 2.1 — AB (ref 0.6–1.3)
Glucose: 232
Potassium: 5.3 mEq/L — AB (ref 3.5–5.1)
Sodium: 142 (ref 137–147)

## 2022-07-21 LAB — HEPATIC FUNCTION PANEL
ALT: 7 U/L — AB (ref 10–40)
AST: 19 (ref 14–40)
Alkaline Phosphatase: 227 — AB (ref 25–125)
Bilirubin, Total: 0.5

## 2022-07-23 ENCOUNTER — Other Ambulatory Visit (HOSPITAL_COMMUNITY): Payer: Self-pay | Admitting: Nephrology

## 2022-07-23 ENCOUNTER — Other Ambulatory Visit: Payer: Self-pay | Admitting: Nephrology

## 2022-07-23 DIAGNOSIS — E875 Hyperkalemia: Secondary | ICD-10-CM | POA: Diagnosis not present

## 2022-07-23 DIAGNOSIS — D631 Anemia in chronic kidney disease: Secondary | ICD-10-CM | POA: Diagnosis not present

## 2022-07-23 DIAGNOSIS — R748 Abnormal levels of other serum enzymes: Secondary | ICD-10-CM

## 2022-07-23 DIAGNOSIS — I129 Hypertensive chronic kidney disease with stage 1 through stage 4 chronic kidney disease, or unspecified chronic kidney disease: Secondary | ICD-10-CM | POA: Diagnosis not present

## 2022-07-23 DIAGNOSIS — E1122 Type 2 diabetes mellitus with diabetic chronic kidney disease: Secondary | ICD-10-CM | POA: Diagnosis not present

## 2022-07-23 DIAGNOSIS — N182 Chronic kidney disease, stage 2 (mild): Secondary | ICD-10-CM

## 2022-07-23 DIAGNOSIS — N189 Chronic kidney disease, unspecified: Secondary | ICD-10-CM | POA: Diagnosis not present

## 2022-07-23 DIAGNOSIS — E559 Vitamin D deficiency, unspecified: Secondary | ICD-10-CM | POA: Diagnosis not present

## 2022-07-24 ENCOUNTER — Encounter (INDEPENDENT_AMBULATORY_CARE_PROVIDER_SITE_OTHER): Payer: Self-pay | Admitting: *Deleted

## 2022-07-28 DIAGNOSIS — E1122 Type 2 diabetes mellitus with diabetic chronic kidney disease: Secondary | ICD-10-CM | POA: Diagnosis not present

## 2022-07-28 DIAGNOSIS — I129 Hypertensive chronic kidney disease with stage 1 through stage 4 chronic kidney disease, or unspecified chronic kidney disease: Secondary | ICD-10-CM | POA: Diagnosis not present

## 2022-07-28 DIAGNOSIS — E7849 Other hyperlipidemia: Secondary | ICD-10-CM | POA: Diagnosis not present

## 2022-07-28 DIAGNOSIS — N1831 Chronic kidney disease, stage 3a: Secondary | ICD-10-CM | POA: Diagnosis not present

## 2022-08-04 ENCOUNTER — Ambulatory Visit (HOSPITAL_COMMUNITY)
Admission: RE | Admit: 2022-08-04 | Discharge: 2022-08-04 | Disposition: A | Discharge status: Home / Self Care | Payer: Medicare Other | Source: Ambulatory Visit | Attending: Nephrology | Admitting: Nephrology

## 2022-08-04 DIAGNOSIS — N189 Chronic kidney disease, unspecified: Secondary | ICD-10-CM | POA: Diagnosis not present

## 2022-08-04 DIAGNOSIS — N181 Chronic kidney disease, stage 1: Secondary | ICD-10-CM | POA: Diagnosis not present

## 2022-08-04 DIAGNOSIS — D631 Anemia in chronic kidney disease: Secondary | ICD-10-CM | POA: Insufficient documentation

## 2022-08-04 DIAGNOSIS — E875 Hyperkalemia: Secondary | ICD-10-CM | POA: Insufficient documentation

## 2022-08-04 DIAGNOSIS — E1122 Type 2 diabetes mellitus with diabetic chronic kidney disease: Secondary | ICD-10-CM | POA: Insufficient documentation

## 2022-08-04 DIAGNOSIS — R748 Abnormal levels of other serum enzymes: Secondary | ICD-10-CM | POA: Insufficient documentation

## 2022-08-04 DIAGNOSIS — I129 Hypertensive chronic kidney disease with stage 1 through stage 4 chronic kidney disease, or unspecified chronic kidney disease: Secondary | ICD-10-CM | POA: Insufficient documentation

## 2022-08-04 DIAGNOSIS — N182 Chronic kidney disease, stage 2 (mild): Secondary | ICD-10-CM | POA: Diagnosis not present

## 2022-08-11 DIAGNOSIS — E1122 Type 2 diabetes mellitus with diabetic chronic kidney disease: Secondary | ICD-10-CM | POA: Diagnosis not present

## 2022-08-11 DIAGNOSIS — E875 Hyperkalemia: Secondary | ICD-10-CM | POA: Diagnosis not present

## 2022-08-18 ENCOUNTER — Other Ambulatory Visit: Payer: Self-pay | Admitting: "Endocrinology

## 2022-08-28 DIAGNOSIS — E782 Mixed hyperlipidemia: Secondary | ICD-10-CM | POA: Diagnosis not present

## 2022-08-28 DIAGNOSIS — I10 Essential (primary) hypertension: Secondary | ICD-10-CM | POA: Diagnosis not present

## 2022-09-16 DIAGNOSIS — N1831 Chronic kidney disease, stage 3a: Secondary | ICD-10-CM | POA: Diagnosis not present

## 2022-09-27 DIAGNOSIS — E782 Mixed hyperlipidemia: Secondary | ICD-10-CM | POA: Diagnosis not present

## 2022-09-27 DIAGNOSIS — I10 Essential (primary) hypertension: Secondary | ICD-10-CM | POA: Diagnosis not present

## 2022-10-01 ENCOUNTER — Other Ambulatory Visit: Payer: Self-pay | Admitting: "Endocrinology

## 2022-10-07 ENCOUNTER — Other Ambulatory Visit: Payer: Self-pay | Admitting: "Endocrinology

## 2022-10-09 DIAGNOSIS — I639 Cerebral infarction, unspecified: Secondary | ICD-10-CM | POA: Diagnosis not present

## 2022-10-09 DIAGNOSIS — I672 Cerebral atherosclerosis: Secondary | ICD-10-CM | POA: Diagnosis not present

## 2022-10-09 DIAGNOSIS — I69319 Unspecified symptoms and signs involving cognitive functions following cerebral infarction: Secondary | ICD-10-CM | POA: Diagnosis not present

## 2022-10-09 DIAGNOSIS — E1142 Type 2 diabetes mellitus with diabetic polyneuropathy: Secondary | ICD-10-CM | POA: Diagnosis not present

## 2022-10-09 DIAGNOSIS — G4733 Obstructive sleep apnea (adult) (pediatric): Secondary | ICD-10-CM | POA: Diagnosis not present

## 2022-10-16 ENCOUNTER — Ambulatory Visit (INDEPENDENT_AMBULATORY_CARE_PROVIDER_SITE_OTHER): Payer: Medicare Other | Admitting: Gastroenterology

## 2022-10-16 ENCOUNTER — Encounter: Payer: Self-pay | Admitting: "Endocrinology

## 2022-10-16 DIAGNOSIS — E7800 Pure hypercholesterolemia, unspecified: Secondary | ICD-10-CM | POA: Diagnosis not present

## 2022-10-16 DIAGNOSIS — E1122 Type 2 diabetes mellitus with diabetic chronic kidney disease: Secondary | ICD-10-CM | POA: Diagnosis not present

## 2022-10-16 DIAGNOSIS — Z1329 Encounter for screening for other suspected endocrine disorder: Secondary | ICD-10-CM | POA: Diagnosis not present

## 2022-10-16 DIAGNOSIS — N1832 Chronic kidney disease, stage 3b: Secondary | ICD-10-CM | POA: Diagnosis not present

## 2022-10-20 ENCOUNTER — Encounter: Payer: Self-pay | Admitting: "Endocrinology

## 2022-10-20 ENCOUNTER — Ambulatory Visit (INDEPENDENT_AMBULATORY_CARE_PROVIDER_SITE_OTHER): Payer: Medicare Other | Admitting: "Endocrinology

## 2022-10-20 VITALS — BP 122/66 | HR 92 | Ht 63.0 in | Wt 162.8 lb

## 2022-10-20 DIAGNOSIS — Z794 Long term (current) use of insulin: Secondary | ICD-10-CM

## 2022-10-20 DIAGNOSIS — E1122 Type 2 diabetes mellitus with diabetic chronic kidney disease: Secondary | ICD-10-CM

## 2022-10-20 DIAGNOSIS — N1832 Chronic kidney disease, stage 3b: Secondary | ICD-10-CM

## 2022-10-20 DIAGNOSIS — E782 Mixed hyperlipidemia: Secondary | ICD-10-CM | POA: Diagnosis not present

## 2022-10-20 DIAGNOSIS — I10 Essential (primary) hypertension: Secondary | ICD-10-CM

## 2022-10-20 LAB — POCT GLYCOSYLATED HEMOGLOBIN (HGB A1C): HbA1c, POC (controlled diabetic range): 7.9 % — AB (ref 0.0–7.0)

## 2022-10-20 MED ORDER — TRESIBA FLEXTOUCH 100 UNIT/ML ~~LOC~~ SOPN
30.0000 [IU] | PEN_INJECTOR | Freq: Every day | SUBCUTANEOUS | 1 refills | Status: DC
Start: 2022-10-20 — End: 2023-01-01

## 2022-10-20 NOTE — Patient Instructions (Signed)
                                     Advice for Weight Management  -For most of us the best way to lose weight is by diet management. Generally speaking, diet management means consuming less calories intentionally which over time brings about progressive weight loss.  This can be achieved more effectively by avoiding ultra processed carbohydrates, processed meats, unhealthy fats.    It is critically important to know your numbers: how much calorie you are consuming and how much calorie you need. More importantly, our carbohydrates sources should be unprocessed naturally occurring  complex starch food items.  It is always important to balance nutrition also by  appropriate intake of proteins (mainly plant-based), healthy fats/oils, plenty of fruits and vegetables.   -The American College of Lifestyle Medicine (ACL M) recommends nutrition derived mostly from Whole Food, Plant Predominant Sources example an apple instead of applesauce or apple pie. Eat Plenty of vegetables, Mushrooms, fruits, Legumes, Whole Grains, Nuts, seeds in lieu of processed meats, processed snacks/pastries red meat, poultry, eggs.  Use only water or unsweetened tea for hydration.  The College also recommends the need to stay away from risky substances including alcohol, smoking; obtaining 7-9 hours of restorative sleep, at least 150 minutes of moderate intensity exercise weekly, importance of healthy social connections, and being mindful of stress and seek help when it is overwhelming.    -Sticking to a routine mealtime to eat 3 meals a day and avoiding unnecessary snacks is shown to have a big role in weight control. Under normal circumstances, the only time we burn stored energy is when we are hungry, so allow  some hunger to take place- hunger means no food between appropriate meal times, only water.  It is not advisable to starve.   -It is better to avoid simple carbohydrates including:  Cakes, Sweet Desserts, Ice Cream, Soda (diet and regular), Sweet Tea, Candies, Chips, Cookies, Store Bought Juices, Alcohol in Excess of  1-2 drinks a day, Lemonade,  Artificial Sweeteners, Doughnuts, Coffee Creamers, "Sugar-free" Products, etc, etc.  This is not a complete list.....    -Consulting with certified diabetes educators is proven to provide you with the most accurate and current information on diet.  Also, you may be  interested in discussing diet options/exchanges , we can schedule a visit with Matthew Harrell, RDN, CDE for individualized nutrition education.  -Exercise: If you are able: 30 -60 minutes a day ,4 days a week, or 150 minutes of moderate intensity exercise weekly.    The longer the better if tolerated.  Combine stretch, strength, and aerobic activities.  If you were told in the past that you have high risk for cardiovascular diseases, or if you are currently symptomatic, you may seek evaluation by your heart doctor prior to initiating moderate to intense exercise programs.                                  Additional Care Considerations for Diabetes/Prediabetes   -Diabetes  is a chronic disease.  The most important care consideration is regular follow-up with your diabetes care provider with the goal being avoiding or delaying its complications and to take advantage of advances in medications and technology.  If appropriate actions are taken early enough, type 2 diabetes can even be   reversed.  Seek information from the right source.  - Whole Food, Plant Predominant Nutrition is highly recommended: Eat Plenty of vegetables, Mushrooms, fruits, Legumes, Whole Grains, Nuts, seeds in lieu of processed meats, processed snacks/pastries red meat, poultry, eggs as recommended by American College of  Lifestyle Medicine (ACLM).  -Type 2 diabetes is known to coexist with other important comorbidities such as high blood pressure and high cholesterol.  It is critical to control not only the  diabetes but also the high blood pressure and high cholesterol to minimize and delay the risk of complications including coronary artery disease, stroke, amputations, blindness, etc.  The good news is that this diet recommendation for type 2 diabetes is also very helpful for managing high cholesterol and high blood blood pressure.  - Studies showed that people with diabetes will benefit from a class of medications known as ACE inhibitors and statins.  Unless there are specific reasons not to be on these medications, the standard of care is to consider getting one from these groups of medications at an optimal doses.  These medications are generally considered safe and proven to help protect the heart and the kidneys.    - People with diabetes are encouraged to initiate and maintain regular follow-up with eye doctors, foot doctors, dentists , and if necessary heart and kidney doctors.     - It is highly recommended that people with diabetes quit smoking or stay away from smoking, and get yearly  flu vaccine and pneumonia vaccine at least every 5 years.  See above for additional recommendations on exercise, sleep, stress management , and healthy social connections.      

## 2022-10-20 NOTE — Progress Notes (Signed)
10/20/2022, 7:05 PM  Endocrinology follow-up note   Subjective:    Patient ID: Matthew Harrell, male    DOB: [birth year].  Matthew Harrell is being seen in follow-up after he was seen in consultation for management of currently uncontrolled symptomatic diabetes requested by  [place] Nation, MD. He is accompanied by his daughter Matthew Harrell to clinic today.  Matthew Harrell is offering to  help.  Past Medical History:  Diagnosis Date   Arthritis    Diabetes ([place])    Diabetes mellitus, type II ([place])    Hypertension    Kidney disease    Stroke (cerebrum) ([place])     Past Surgical History:  Procedure Laterality Date   NASAL SINUS SURGERY     TOTAL HIP ARTHROPLASTY     Left    Social History   Socioeconomic History   Marital status: Divorced    Spouse name: Not on file   Number of children: 3   Years of education: Not on file   Highest education level: Not on file  Occupational History   Occupation: retired  Tobacco Use   Smoking status: Former    Packs/day: 1.00    Years: 10.00    Total pack years: 10.00    Types: Cigarettes    Quit date: 12/29/1982    Years since quitting: 39.8   Smokeless tobacco: Never  Substance and Sexual Activity   Alcohol use: No   Drug use: No   Sexual activity: Not on file  Other Topics Concern   Not on file  Social History Narrative   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     Social Determinants of Health   Financial Resource Strain: Not on file  Food Insecurity: Not on file  Transportation Needs: Not on file  Physical Activity: Not on file  Stress: Not on file  Social Connections: Not on file    Family History  Problem Relation Age of Onset   Diabetes Brother    Stomach cancer Sister    Aneurysm Mother 21   Heart attack Father 25    Outpatient Encounter Medications as of 10/20/2022  Medication Sig   Acetaminophen 500 MG capsule Take by mouth as needed for fever.   albuterol (PROVENTIL HFA;VENTOLIN HFA) 108 (90  Base) MCG/ACT inhaler Inhale 1-2 puffs into the lungs every 6 (six) hours as needed for wheezing or shortness of breath.   amLODipine (NORVASC) 5 MG tablet Take 5 mg by mouth daily.   atorvastatin (LIPITOR) 40 MG tablet Take 1 tablet by mouth daily.   Blood Glucose Monitoring Suppl (CONTOUR NEXT MONITOR) w/Device KIT 1 each by Does not apply route 4 (four) times daily.   clopidogrel (PLAVIX) 75 MG tablet Take 1 tablet by mouth daily.   cyanocobalamin 1000 MCG tablet Take 1 tablet by mouth daily.   diclofenac sodium (VOLTAREN) 1 % GEL Apply 2 g topically 4 (four) times daily as needed (pain).    DULoxetine (CYMBALTA) 30 MG capsule Take 30 mg by mouth 2 (two) times daily.   dutasteride (AVODART) 0.5 MG capsule Take 0.5 mg by mouth every morning.   fluticasone (FLONASE) 50 MCG/ACT nasal spray Place 2 sprays into both nostrils daily.   glipiZIDE (GLUCOTROL XL) 5 MG 24 hr tablet TAKE ONE TABLET BY MOUTH EVERY MORNING   glucose blood (ACCU-CHEK GUIDE) test strip 1 each by Other route 2 (two) times daily.   HYDROcodone-acetaminophen (NORCO/VICODIN) 5-325 MG tablet Take 1 tablet by mouth every  6 (six) hours as needed for moderate pain. (Patient not taking: Reported on 04/24/2021)   insulin degludec (TRESIBA FLEXTOUCH) 100 UNIT/ML FlexTouch Pen Inject 30 Units into the skin at bedtime.   Insulin Pen Needle (B-D ULTRAFINE III SHORT PEN) 31G X 8 MM MISC 1 each by Does not apply route as directed.   Insulin Pen Needle (COMFORT EZ PEN NEEDLES) 32G X 4 MM MISC USE TO INJECT INSULIN DAILY AS DIRECTED   Lancets Thin MISC 1 each by Does not apply route 4 (four) times daily.   omeprazole (PRILOSEC) 20 MG capsule Take 1 capsule (20 mg total) by mouth daily.   tamsulosin (FLOMAX) 0.4 MG CAPS capsule Take 0.4 mg by mouth every morning.   traMADol (ULTRAM) 50 MG tablet Take 100 mg by mouth 3 (three) times daily as needed.   VELTASSA 8.4 g packet Take 1 packet by mouth daily.   [DISCONTINUED] LOKELMA 5 g packet  SMARTSIG:5 Gram(s) By Mouth Twice a Week   [DISCONTINUED] TRESIBA FLEXTOUCH 100 UNIT/ML FlexTouch Pen INJECT 24 UNITS into THE SKIN EVERYDAY AT BEDTIME   No facility-administered encounter medications on file as of 10/20/2022.    ALLERGIES: No Known Allergies  VACCINATION STATUS: Immunization History  Administered Date(s) Administered   Influenza Split 09/28/2013   Pneumococcal Polysaccharide-23 12/30/2007    Diabetes He presents for his follow-up diabetic visit. He has type 2 diabetes mellitus. Onset time: He was diagnosed at approximate age of 75 years. His disease course has been improving. There are no hypoglycemic associated symptoms. Pertinent negatives for hypoglycemia include no confusion, headaches, pallor or seizures. Pertinent negatives for diabetes include no chest pain, no fatigue, no polydipsia, no polyphagia, no polyuria, no weakness and no weight loss. There are no hypoglycemic complications. Symptoms are improving. Diabetic complications include a CVA and nephropathy. Risk factors for coronary artery disease include diabetes mellitus, dyslipidemia, family history, male sex, hypertension, sedentary lifestyle and tobacco exposure. Current diabetic treatments: He is currently on Ozempic 0.5 mg subcutaneous weekly.  He was recently taken off of Jardiance and metformin due to CKD. His weight is stable. He is following a generally unhealthy diet. When asked about meal planning, he reported none. He participates in exercise intermittently. His home blood glucose trend is fluctuating minimally. His breakfast blood glucose range is generally 140-180 mg/dl. His bedtime blood glucose range is generally 180-200 mg/dl. His overall blood glucose range is 180-200 mg/dl. (He presents with continued improvement in his glycemic profile.  He is accompanied by his son-in-law today.  His average blood glucose has been  155- 170., for  the last 30 days.  His point-of-care A1c is  7.9% improving from 11%  .   No documented hypoglycemia.  )  Hyperlipidemia This is a chronic problem. The current episode started more than 1 year ago. Pertinent negatives include no chest pain, myalgias or shortness of breath. Current antihyperlipidemic treatment includes statins. Risk factors for coronary artery disease include dyslipidemia, diabetes mellitus, hypertension and male sex.  Hypertension This is a chronic problem. The current episode started more than 1 year ago. The problem is controlled. Pertinent negatives include no chest pain, headaches, neck pain, palpitations or shortness of breath. Risk factors for coronary artery disease include dyslipidemia, diabetes mellitus, male gender, sedentary lifestyle and smoking/tobacco exposure. Hypertensive end-organ damage includes CVA.     Review of Systems  Constitutional:  Negative for chills, fatigue, fever, unexpected weight change and weight loss.  HENT:  Negative for dental problem, mouth sores and  trouble swallowing.   Eyes:  Negative for visual disturbance.  Respiratory:  Negative for cough, choking, chest tightness, shortness of breath and wheezing.   Cardiovascular:  Negative for chest pain, palpitations and leg swelling.  Gastrointestinal:  Negative for abdominal distention, abdominal pain, constipation, diarrhea, nausea and vomiting.  Endocrine: Negative for polydipsia, polyphagia and polyuria.  Genitourinary:  Negative for dysuria, flank pain, hematuria and urgency.  Musculoskeletal:  Negative for back pain, gait problem, myalgias and neck pain.  Skin:  Negative for pallor, rash and wound.  Neurological:  Negative for seizures, syncope, weakness, numbness and headaches.  Psychiatric/Behavioral:  Negative for confusion and dysphoric mood.     Objective:       10/20/2022    4:02 PM 12/31/2021    3:54 PM 09/09/2021    4:00 PM  Vitals with BMI  Height _0  _1  _2   Weight 162 lbs 13 oz 165 lbs 155 lbs 6 oz  BMI 28.85 74.12 87.86  Systolic  767 209 470  Diastolic 66 60 54  Pulse 92 76 88    BP 122/66   Pulse 92   Ht _3  (1.6 m)   Wt 162 lb 12.8 oz (73.8 kg)   BMI 28.84 kg/m   Wt Readings from Last 3 Encounters:  10/20/22 162 lb 12.8 oz (73.8 kg)  12/31/21 165 lb (74.8 kg)  09/09/21 155 lb 6.4 oz (70.5 kg)         CMP ( most recent) CMP     Component Value Date/Time   NA 142 07/21/2022 0000   K 5.3 (A) 07/21/2022 0000   CL 104 07/21/2022 0000   CO2 23 (A) 07/21/2022 0000   GLUCOSE 102 (H) 08/27/2018 0418   BUN 36 (A) 07/21/2022 0000   CREATININE 2.1 (A) 07/21/2022 0000   CREATININE 1.55 (H) 08/27/2018 0418   CALCIUM 9.0 07/21/2022 0000   PROT 8.2 (H) 08/23/2018 2025   ALBUMIN 4.4 07/21/2022 0000   AST 19 07/21/2022 0000   ALT 7 (A) 07/21/2022 0000   ALKPHOS 227 (A) 07/21/2022 0000   BILITOT 0.9 08/23/2018 2025   GFRNONAA 28 08/23/2021 0000   GFRAA 48 (L) 08/27/2018 0418     Diabetic Labs (most recent): Lab Results  Component Value Date   HGBA1C 7.9 (A) 10/20/2022   HGBA1C 11 08/29/2021   HGBA1C 8.1 04/12/2021     Assessment & Plan:   1. Type 2 diabetes mellitus with stage 3b chronic kidney disease, without long-term current use of insulin ([place])  - Matthew Harrell has currently uncontrolled symptomatic type 2 DM since  81 years of age.  He presents with continued improvement in his glycemic profile.  He is accompanied by his son-in-law today.  His average blood glucose has been  155- 170., for  the last 30 days.  His point-of-care A1c is  7.9% improving from 11% .   No documented hypoglycemia.     Recent labs reviewed. - I had a long discussion with him about the progressive nature of diabetes and the pathology behind its complications. -his diabetes is complicated by CVA, nephropathy, and history of heavy alcohol use/abuse and he remains at a high risk for more acute and chronic complications which include CAD, CVA, CKD, retinopathy, and neuropathy. These are all discussed in detail  with him.  - I have counseled him on diet  and weight management  by adopting a carbohydrate restricted/protein rich diet. Patient is encouraged to switch to  unprocessed or  minimally processed     complex starch and increased protein intake (animal or plant source), fruits, and vegetables. -  he is advised to stick to a routine mealtimes to eat 3 meals  a day and avoid unnecessary snacks ( to snack only to correct hypoglycemia).   - he acknowledges that there is a room for improvement in his food and drink choices. - Suggestion is made for him to avoid simple carbohydrates  from his diet including Cakes, Sweet Desserts, Ice Cream, Soda (diet and regular), Sweet Tea, Candies, Chips, Cookies, Store Bought Juices, Alcohol in Excess of  1-2 drinks a day, Artificial Sweeteners,  Coffee Creamer, and "Sugar-free" Products, Lemonade. This will help patient to have more stable blood glucose profile and potentially avoid unintended weight gain.   - he will be scheduled with Jearld Fenton, RDN, CDE for diabetes education.  - I have approached him with the following individualized plan to manage  his diabetes and patient agrees:   -Patient has benefited from low-dose basal insulin.  In order for him to achieve better glycemic profile, he is advised to increase his [place] to 30 units nightly,    associated with monitoring of blood glucose twice a day-daily before breakfast and at bedtime.   - he is encouraged to call clinic for blood glucose levels less than 70 or above 200 mg /dl x3 in a week fasting.  -He is not a candidate for metformin.  He will continue to benefit from low-dose glipizide, 5 mg XL p.o. daily at breakfast.   -Due to unintended weight loss, he was taken off of incretin therapy, as well as SGLT2 inhibitors.  - Specific targets for  A1c;  LDL, HDL,  and Triglycerides were discussed with the patient.  2) Blood Pressure /Hypertension:  - His blood pressure is controlled to target.  he is  advised to continue his current medications including amlodipine 5 mg p.o. daily.  He will be considered for low-dose ACE inhibitor or ARB if no contraindications from nephrology point of view.   3) Lipids/Hyperlipidemia: He does not have recent fasting lipid panel to review.  He is advised to continue atorvastatin 20 mg p.o. nightly, side effects and precautions discussed with him.    4)  Weight/Diet:  Body mass index is 28.84 kg/m.  -  he is not a candidate for major weight loss. I discussed with him the fact that loss of 5 - 10% of his  current body weight will have the most impact on his diabetes management.  Exercise, and detailed carbohydrates information provided  -  detailed on discharge instructions.  5) Chronic Care/Health Maintenance:  -he  Is on Statin medications and  is encouraged to initiate and continue to follow up with Ophthalmology, Dentist,  Podiatrist at least yearly or according to recommendations, and advised to   stay away from smoking. I have recommended yearly flu vaccine and pneumonia vaccine at least every 5 years; moderate intensity exercise for up to 150 minutes weekly; and  sleep for at least 7 hours a day.  - he is  advised to maintain close follow up with Olney Nation, MD for primary care needs, as well as his other providers for optimal and coordinated care.   I spent 41 minutes in the care of the patient today including review of labs from [facility], Lipids, Thyroid Function, Hematology (current and previous including abstractions from other facilities); face-to-face time discussing  his blood glucose readings/logs, discussing hypoglycemia and hyperglycemia episodes  and symptoms, medications doses, his options of short and long term treatment based on the latest standards of care / guidelines;  discussion about incorporating lifestyle medicine;  and documenting the encounter. Risk reduction counseling performed per USPSTF guidelines to reduce cardiovascular risk  factors.     Please refer to Patient Instructions for Blood Glucose Monitoring and Insulin/Medications Dosing Guide"  in media tab for additional information. Please  also refer to " Patient Self Inventory" in the Media  tab for reviewed elements of pertinent patient history.  Matthew Harrell participated in the discussions, expressed understanding, and voiced agreement with the above plans.  All questions were answered to his satisfaction. he is encouraged to contact clinic should he have any questions or concerns prior to his return visit.   Follow up plan: - Return in about 4 months (around 02/20/2023) for Bring Meter/CGM Device/Logs- A1c in Office.  Glade Lloyd, MD [facility] [address] [place], [place] [address] Phone: [phone]  Fax: [phone]    10/20/2022, 7:05 PM  This note was partially dictated with voice recognition software. Similar sounding words can be transcribed inadequately or may not  be corrected upon review.

## 2022-10-28 DIAGNOSIS — I10 Essential (primary) hypertension: Secondary | ICD-10-CM | POA: Diagnosis not present

## 2022-10-28 DIAGNOSIS — E782 Mixed hyperlipidemia: Secondary | ICD-10-CM | POA: Diagnosis not present

## 2022-10-29 ENCOUNTER — Other Ambulatory Visit: Payer: Self-pay | Admitting: Neurology

## 2022-10-29 ENCOUNTER — Other Ambulatory Visit (HOSPITAL_COMMUNITY): Payer: Self-pay | Admitting: Neurology

## 2022-10-29 DIAGNOSIS — N189 Chronic kidney disease, unspecified: Secondary | ICD-10-CM | POA: Diagnosis not present

## 2022-10-29 DIAGNOSIS — M4802 Spinal stenosis, cervical region: Secondary | ICD-10-CM

## 2022-10-29 DIAGNOSIS — Z8679 Personal history of other diseases of the circulatory system: Secondary | ICD-10-CM | POA: Diagnosis not present

## 2022-10-29 DIAGNOSIS — R748 Abnormal levels of other serum enzymes: Secondary | ICD-10-CM | POA: Diagnosis not present

## 2022-10-29 DIAGNOSIS — E1122 Type 2 diabetes mellitus with diabetic chronic kidney disease: Secondary | ICD-10-CM | POA: Diagnosis not present

## 2022-10-29 DIAGNOSIS — I639 Cerebral infarction, unspecified: Secondary | ICD-10-CM

## 2022-10-29 DIAGNOSIS — E875 Hyperkalemia: Secondary | ICD-10-CM | POA: Diagnosis not present

## 2022-10-29 DIAGNOSIS — D631 Anemia in chronic kidney disease: Secondary | ICD-10-CM | POA: Diagnosis not present

## 2022-10-29 DIAGNOSIS — I952 Hypotension due to drugs: Secondary | ICD-10-CM | POA: Diagnosis not present

## 2022-10-29 DIAGNOSIS — E870 Hyperosmolality and hypernatremia: Secondary | ICD-10-CM | POA: Diagnosis not present

## 2022-10-29 DIAGNOSIS — E559 Vitamin D deficiency, unspecified: Secondary | ICD-10-CM | POA: Diagnosis not present

## 2022-11-27 ENCOUNTER — Encounter (INDEPENDENT_AMBULATORY_CARE_PROVIDER_SITE_OTHER): Payer: Self-pay | Admitting: *Deleted

## 2022-11-27 ENCOUNTER — Ambulatory Visit (INDEPENDENT_AMBULATORY_CARE_PROVIDER_SITE_OTHER): Payer: Medicare Other | Admitting: Gastroenterology

## 2022-11-27 ENCOUNTER — Ambulatory Visit (HOSPITAL_COMMUNITY)
Admission: RE | Admit: 2022-11-27 | Discharge: 2022-11-27 | Disposition: A | Discharge status: Home / Self Care | Payer: Medicare Other | Source: Ambulatory Visit | Attending: Neurology | Admitting: Neurology

## 2022-11-27 DIAGNOSIS — G319 Degenerative disease of nervous system, unspecified: Secondary | ICD-10-CM | POA: Diagnosis not present

## 2022-11-27 DIAGNOSIS — M4802 Spinal stenosis, cervical region: Secondary | ICD-10-CM | POA: Diagnosis not present

## 2022-11-27 DIAGNOSIS — I639 Cerebral infarction, unspecified: Secondary | ICD-10-CM | POA: Diagnosis not present

## 2022-11-27 DIAGNOSIS — R519 Headache, unspecified: Secondary | ICD-10-CM | POA: Diagnosis not present

## 2022-11-27 DIAGNOSIS — M542 Cervicalgia: Secondary | ICD-10-CM | POA: Diagnosis not present

## 2022-12-02 DIAGNOSIS — I672 Cerebral atherosclerosis: Secondary | ICD-10-CM | POA: Diagnosis not present

## 2022-12-02 DIAGNOSIS — I69319 Unspecified symptoms and signs involving cognitive functions following cerebral infarction: Secondary | ICD-10-CM | POA: Diagnosis not present

## 2022-12-02 DIAGNOSIS — I639 Cerebral infarction, unspecified: Secondary | ICD-10-CM | POA: Diagnosis not present

## 2022-12-02 DIAGNOSIS — G4733 Obstructive sleep apnea (adult) (pediatric): Secondary | ICD-10-CM | POA: Diagnosis not present

## 2022-12-18 DIAGNOSIS — J329 Chronic sinusitis, unspecified: Secondary | ICD-10-CM | POA: Diagnosis not present

## 2023-01-01 ENCOUNTER — Other Ambulatory Visit: Payer: Self-pay | Admitting: "Endocrinology

## 2023-01-13 ENCOUNTER — Other Ambulatory Visit: Payer: Self-pay | Admitting: "Endocrinology

## 2023-01-14 ENCOUNTER — Other Ambulatory Visit: Payer: Self-pay | Admitting: "Endocrinology

## 2023-01-28 DIAGNOSIS — I10 Essential (primary) hypertension: Secondary | ICD-10-CM | POA: Diagnosis not present

## 2023-01-28 DIAGNOSIS — E782 Mixed hyperlipidemia: Secondary | ICD-10-CM | POA: Diagnosis not present

## 2023-02-03 DIAGNOSIS — E1122 Type 2 diabetes mellitus with diabetic chronic kidney disease: Secondary | ICD-10-CM | POA: Diagnosis not present

## 2023-02-03 DIAGNOSIS — I10 Essential (primary) hypertension: Secondary | ICD-10-CM | POA: Diagnosis not present

## 2023-02-03 DIAGNOSIS — E7800 Pure hypercholesterolemia, unspecified: Secondary | ICD-10-CM | POA: Diagnosis not present

## 2023-02-03 DIAGNOSIS — E1169 Type 2 diabetes mellitus with other specified complication: Secondary | ICD-10-CM | POA: Diagnosis not present

## 2023-02-03 DIAGNOSIS — E559 Vitamin D deficiency, unspecified: Secondary | ICD-10-CM | POA: Diagnosis not present

## 2023-02-03 DIAGNOSIS — J019 Acute sinusitis, unspecified: Secondary | ICD-10-CM | POA: Diagnosis not present

## 2023-02-06 DIAGNOSIS — R059 Cough, unspecified: Secondary | ICD-10-CM | POA: Diagnosis not present

## 2023-02-06 DIAGNOSIS — J019 Acute sinusitis, unspecified: Secondary | ICD-10-CM | POA: Diagnosis not present

## 2023-02-19 DIAGNOSIS — E559 Vitamin D deficiency, unspecified: Secondary | ICD-10-CM | POA: Diagnosis not present

## 2023-02-19 DIAGNOSIS — I129 Hypertensive chronic kidney disease with stage 1 through stage 4 chronic kidney disease, or unspecified chronic kidney disease: Secondary | ICD-10-CM | POA: Diagnosis not present

## 2023-02-19 DIAGNOSIS — E870 Hyperosmolality and hypernatremia: Secondary | ICD-10-CM | POA: Diagnosis not present

## 2023-02-19 DIAGNOSIS — E1122 Type 2 diabetes mellitus with diabetic chronic kidney disease: Secondary | ICD-10-CM | POA: Diagnosis not present

## 2023-02-19 DIAGNOSIS — D631 Anemia in chronic kidney disease: Secondary | ICD-10-CM | POA: Diagnosis not present

## 2023-02-19 DIAGNOSIS — N189 Chronic kidney disease, unspecified: Secondary | ICD-10-CM | POA: Diagnosis not present

## 2023-02-19 DIAGNOSIS — E875 Hyperkalemia: Secondary | ICD-10-CM | POA: Diagnosis not present

## 2023-02-20 DIAGNOSIS — R296 Repeated falls: Secondary | ICD-10-CM | POA: Diagnosis not present

## 2023-02-20 DIAGNOSIS — E785 Hyperlipidemia, unspecified: Secondary | ICD-10-CM | POA: Diagnosis not present

## 2023-02-20 DIAGNOSIS — G4733 Obstructive sleep apnea (adult) (pediatric): Secondary | ICD-10-CM | POA: Diagnosis not present

## 2023-02-20 DIAGNOSIS — N183 Chronic kidney disease, stage 3 unspecified: Secondary | ICD-10-CM | POA: Diagnosis not present

## 2023-02-20 DIAGNOSIS — Z96642 Presence of left artificial hip joint: Secondary | ICD-10-CM | POA: Diagnosis not present

## 2023-02-20 DIAGNOSIS — E7800 Pure hypercholesterolemia, unspecified: Secondary | ICD-10-CM | POA: Diagnosis not present

## 2023-02-20 DIAGNOSIS — R0902 Hypoxemia: Secondary | ICD-10-CM | POA: Diagnosis not present

## 2023-02-20 DIAGNOSIS — R54 Age-related physical debility: Secondary | ICD-10-CM | POA: Diagnosis not present

## 2023-02-20 DIAGNOSIS — R410 Disorientation, unspecified: Secondary | ICD-10-CM | POA: Diagnosis not present

## 2023-02-20 DIAGNOSIS — K219 Gastro-esophageal reflux disease without esophagitis: Secondary | ICD-10-CM | POA: Diagnosis not present

## 2023-02-20 DIAGNOSIS — Z87891 Personal history of nicotine dependence: Secondary | ICD-10-CM | POA: Diagnosis not present

## 2023-02-20 DIAGNOSIS — R531 Weakness: Secondary | ICD-10-CM | POA: Diagnosis not present

## 2023-02-20 DIAGNOSIS — E860 Dehydration: Secondary | ICD-10-CM | POA: Diagnosis not present

## 2023-02-20 DIAGNOSIS — Z043 Encounter for examination and observation following other accident: Secondary | ICD-10-CM | POA: Diagnosis not present

## 2023-02-20 DIAGNOSIS — R6889 Other general symptoms and signs: Secondary | ICD-10-CM | POA: Diagnosis not present

## 2023-02-20 DIAGNOSIS — I10 Essential (primary) hypertension: Secondary | ICD-10-CM | POA: Diagnosis not present

## 2023-02-20 DIAGNOSIS — I499 Cardiac arrhythmia, unspecified: Secondary | ICD-10-CM | POA: Diagnosis not present

## 2023-02-20 DIAGNOSIS — Z79899 Other long term (current) drug therapy: Secondary | ICD-10-CM | POA: Diagnosis not present

## 2023-02-20 DIAGNOSIS — K6389 Other specified diseases of intestine: Secondary | ICD-10-CM | POA: Diagnosis not present

## 2023-02-20 DIAGNOSIS — I700 Atherosclerosis of aorta: Secondary | ICD-10-CM | POA: Diagnosis not present

## 2023-02-20 DIAGNOSIS — Z794 Long term (current) use of insulin: Secondary | ICD-10-CM | POA: Diagnosis not present

## 2023-02-20 DIAGNOSIS — K5900 Constipation, unspecified: Secondary | ICD-10-CM | POA: Diagnosis not present

## 2023-02-20 DIAGNOSIS — Z743 Need for continuous supervision: Secondary | ICD-10-CM | POA: Diagnosis not present

## 2023-02-20 DIAGNOSIS — I129 Hypertensive chronic kidney disease with stage 1 through stage 4 chronic kidney disease, or unspecified chronic kidney disease: Secondary | ICD-10-CM | POA: Diagnosis not present

## 2023-02-20 DIAGNOSIS — Z7982 Long term (current) use of aspirin: Secondary | ICD-10-CM | POA: Diagnosis not present

## 2023-02-20 DIAGNOSIS — J9811 Atelectasis: Secondary | ICD-10-CM | POA: Diagnosis not present

## 2023-02-20 DIAGNOSIS — Z7984 Long term (current) use of oral hypoglycemic drugs: Secondary | ICD-10-CM | POA: Diagnosis not present

## 2023-02-20 DIAGNOSIS — E1122 Type 2 diabetes mellitus with diabetic chronic kidney disease: Secondary | ICD-10-CM | POA: Diagnosis not present

## 2023-02-20 DIAGNOSIS — E875 Hyperkalemia: Secondary | ICD-10-CM | POA: Diagnosis not present

## 2023-02-20 DIAGNOSIS — K5901 Slow transit constipation: Secondary | ICD-10-CM | POA: Diagnosis not present

## 2023-02-20 DIAGNOSIS — Z1152 Encounter for screening for COVID-19: Secondary | ICD-10-CM | POA: Diagnosis not present

## 2023-02-21 DIAGNOSIS — I129 Hypertensive chronic kidney disease with stage 1 through stage 4 chronic kidney disease, or unspecified chronic kidney disease: Secondary | ICD-10-CM | POA: Diagnosis not present

## 2023-02-21 DIAGNOSIS — Z794 Long term (current) use of insulin: Secondary | ICD-10-CM | POA: Diagnosis not present

## 2023-02-21 DIAGNOSIS — E875 Hyperkalemia: Secondary | ICD-10-CM | POA: Diagnosis not present

## 2023-02-21 DIAGNOSIS — E785 Hyperlipidemia, unspecified: Secondary | ICD-10-CM | POA: Diagnosis not present

## 2023-02-21 DIAGNOSIS — K219 Gastro-esophageal reflux disease without esophagitis: Secondary | ICD-10-CM | POA: Diagnosis not present

## 2023-02-21 DIAGNOSIS — G4733 Obstructive sleep apnea (adult) (pediatric): Secondary | ICD-10-CM | POA: Diagnosis not present

## 2023-02-21 DIAGNOSIS — I700 Atherosclerosis of aorta: Secondary | ICD-10-CM | POA: Diagnosis not present

## 2023-02-21 DIAGNOSIS — E1122 Type 2 diabetes mellitus with diabetic chronic kidney disease: Secondary | ICD-10-CM | POA: Diagnosis not present

## 2023-02-21 DIAGNOSIS — N183 Chronic kidney disease, stage 3 unspecified: Secondary | ICD-10-CM | POA: Diagnosis not present

## 2023-02-21 DIAGNOSIS — K5900 Constipation, unspecified: Secondary | ICD-10-CM | POA: Diagnosis not present

## 2023-02-21 DIAGNOSIS — Z9989 Dependence on other enabling machines and devices: Secondary | ICD-10-CM | POA: Diagnosis not present

## 2023-02-21 DIAGNOSIS — R54 Age-related physical debility: Secondary | ICD-10-CM | POA: Diagnosis not present

## 2023-02-24 ENCOUNTER — Other Ambulatory Visit: Payer: Self-pay | Admitting: "Endocrinology

## 2023-02-24 ENCOUNTER — Ambulatory Visit: Payer: Medicare Other | Admitting: "Endocrinology

## 2023-02-25 DIAGNOSIS — R55 Syncope and collapse: Secondary | ICD-10-CM | POA: Diagnosis not present

## 2023-02-25 DIAGNOSIS — K5900 Constipation, unspecified: Secondary | ICD-10-CM | POA: Diagnosis not present

## 2023-02-26 DIAGNOSIS — Z91041 Radiographic dye allergy status: Secondary | ICD-10-CM | POA: Diagnosis not present

## 2023-02-26 DIAGNOSIS — Z7982 Long term (current) use of aspirin: Secondary | ICD-10-CM | POA: Diagnosis not present

## 2023-02-26 DIAGNOSIS — Z7984 Long term (current) use of oral hypoglycemic drugs: Secondary | ICD-10-CM | POA: Diagnosis not present

## 2023-02-26 DIAGNOSIS — Z7902 Long term (current) use of antithrombotics/antiplatelets: Secondary | ICD-10-CM | POA: Diagnosis not present

## 2023-02-26 DIAGNOSIS — K5900 Constipation, unspecified: Secondary | ICD-10-CM | POA: Diagnosis not present

## 2023-02-26 DIAGNOSIS — Z87891 Personal history of nicotine dependence: Secondary | ICD-10-CM | POA: Diagnosis not present

## 2023-03-04 DIAGNOSIS — E875 Hyperkalemia: Secondary | ICD-10-CM | POA: Diagnosis not present

## 2023-03-04 DIAGNOSIS — M19011 Primary osteoarthritis, right shoulder: Secondary | ICD-10-CM | POA: Diagnosis not present

## 2023-03-04 DIAGNOSIS — Z7982 Long term (current) use of aspirin: Secondary | ICD-10-CM | POA: Diagnosis not present

## 2023-03-04 DIAGNOSIS — J309 Allergic rhinitis, unspecified: Secondary | ICD-10-CM | POA: Diagnosis not present

## 2023-03-04 DIAGNOSIS — I129 Hypertensive chronic kidney disease with stage 1 through stage 4 chronic kidney disease, or unspecified chronic kidney disease: Secondary | ICD-10-CM | POA: Diagnosis not present

## 2023-03-04 DIAGNOSIS — Z7984 Long term (current) use of oral hypoglycemic drugs: Secondary | ICD-10-CM | POA: Diagnosis not present

## 2023-03-04 DIAGNOSIS — K219 Gastro-esophageal reflux disease without esophagitis: Secondary | ICD-10-CM | POA: Diagnosis not present

## 2023-03-04 DIAGNOSIS — G4733 Obstructive sleep apnea (adult) (pediatric): Secondary | ICD-10-CM | POA: Diagnosis not present

## 2023-03-04 DIAGNOSIS — Z7902 Long term (current) use of antithrombotics/antiplatelets: Secondary | ICD-10-CM | POA: Diagnosis not present

## 2023-03-04 DIAGNOSIS — Z96642 Presence of left artificial hip joint: Secondary | ICD-10-CM | POA: Diagnosis not present

## 2023-03-04 DIAGNOSIS — Z87891 Personal history of nicotine dependence: Secondary | ICD-10-CM | POA: Diagnosis not present

## 2023-03-04 DIAGNOSIS — N183 Chronic kidney disease, stage 3 unspecified: Secondary | ICD-10-CM | POA: Diagnosis not present

## 2023-03-04 DIAGNOSIS — E1122 Type 2 diabetes mellitus with diabetic chronic kidney disease: Secondary | ICD-10-CM | POA: Diagnosis not present

## 2023-03-04 DIAGNOSIS — Z794 Long term (current) use of insulin: Secondary | ICD-10-CM | POA: Diagnosis not present

## 2023-03-04 DIAGNOSIS — M19012 Primary osteoarthritis, left shoulder: Secondary | ICD-10-CM | POA: Diagnosis not present

## 2023-03-04 DIAGNOSIS — E7800 Pure hypercholesterolemia, unspecified: Secondary | ICD-10-CM | POA: Diagnosis not present

## 2023-03-04 DIAGNOSIS — I2510 Atherosclerotic heart disease of native coronary artery without angina pectoris: Secondary | ICD-10-CM | POA: Diagnosis not present

## 2023-03-04 DIAGNOSIS — I700 Atherosclerosis of aorta: Secondary | ICD-10-CM | POA: Diagnosis not present

## 2023-03-09 ENCOUNTER — Encounter: Payer: Self-pay | Admitting: "Endocrinology

## 2023-03-09 ENCOUNTER — Ambulatory Visit: Payer: Medicare Other | Admitting: "Endocrinology

## 2023-03-09 VITALS — BP 122/64 | HR 100 | Ht 63.0 in | Wt 163.0 lb

## 2023-03-09 DIAGNOSIS — E782 Mixed hyperlipidemia: Secondary | ICD-10-CM

## 2023-03-09 DIAGNOSIS — N1832 Chronic kidney disease, stage 3b: Secondary | ICD-10-CM | POA: Diagnosis not present

## 2023-03-09 DIAGNOSIS — I10 Essential (primary) hypertension: Secondary | ICD-10-CM | POA: Diagnosis not present

## 2023-03-09 DIAGNOSIS — E038 Other specified hypothyroidism: Secondary | ICD-10-CM | POA: Diagnosis not present

## 2023-03-09 DIAGNOSIS — E1122 Type 2 diabetes mellitus with diabetic chronic kidney disease: Secondary | ICD-10-CM

## 2023-03-09 MED ORDER — TRESIBA FLEXTOUCH 100 UNIT/ML ~~LOC~~ SOPN: 30.0000 [IU] | PEN_INJECTOR | Freq: Every day | SUBCUTANEOUS | 1 refills | Status: DC

## 2023-03-09 MED ORDER — GLIPIZIDE ER 5 MG PO TB24: 5.0000 mg | ORAL_TABLET | Freq: Every morning | ORAL | 1 refills | Status: DC

## 2023-03-09 NOTE — Progress Notes (Signed)
03/09/2023, 5:52 PM  Endocrinology follow-up note   Subjective:    Patient ID: Matthew Harrell, male    DOB: Jan 22, 1941.  Matthew Harrell is being seen in follow-up after he was seen in consultation for management of currently uncontrolled symptomatic diabetes requested by  [place] Nation, MD. He is accompanied by his daughter Matthew Harrell to clinic today.  Matthew Harrell is offering to  help.  Past Medical History:  Diagnosis Date   Arthritis    Diabetes ([place])    Diabetes mellitus, type II ([place])    Hypertension    Kidney disease    Stroke (cerebrum) ([place])     Past Surgical History:  Procedure Laterality Date   NASAL SINUS SURGERY     TOTAL HIP ARTHROPLASTY     Left    Social History   Socioeconomic History   Marital status: Divorced    Spouse name: Not on file   Number of children: 3   Years of education: Not on file   Highest education level: Not on file  Occupational History   Occupation: retired  Tobacco Use   Smoking status: Former    Packs/day: 1.00    Years: 10.00    Total pack years: 10.00    Types: Cigarettes    Quit date: 12/29/1982    Years since quitting: 40.2   Smokeless tobacco: Never  Substance and Sexual Activity   Alcohol use: No   Drug use: No   Sexual activity: Not on file  Other Topics Concern   Not on file  Social History Narrative   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     Social Determinants of Health   Financial Resource Strain: Not on file  Food Insecurity: Not on file  Transportation Needs: Not on file  Physical Activity: Not on file  Stress: Not on file  Social Connections: Not on file    Family History  Problem Relation Age of Onset   Diabetes Brother    Stomach cancer Sister    Aneurysm Mother 32   Heart attack Father 31    Outpatient Encounter Medications as of 03/09/2023  Medication Sig   Acetaminophen 500 MG capsule Take by mouth as needed for fever.   albuterol (PROVENTIL HFA;VENTOLIN HFA) 108 (90  Base) MCG/ACT inhaler Inhale 1-2 puffs into the lungs every 6 (six) hours as needed for wheezing or shortness of breath.   amLODipine (NORVASC) 5 MG tablet Take 5 mg by mouth daily.   atorvastatin (LIPITOR) 40 MG tablet Take 1 tablet by mouth daily.   Blood Glucose Monitoring Suppl (CONTOUR NEXT MONITOR) w/Device KIT 1 each by Does not apply route 4 (four) times daily.   clopidogrel (PLAVIX) 75 MG tablet Take 1 tablet by mouth daily.   COMFORT EZ PEN NEEDLES 32G X 4 MM MISC USE TO INJECT insulin DAILY AS DIRECTED   cyanocobalamin 1000 MCG tablet Take 1 tablet by mouth daily.   diclofenac sodium (VOLTAREN) 1 % GEL Apply 2 g topically 4 (four) times daily as needed (pain).    DULoxetine (CYMBALTA) 30 MG capsule Take 30 mg by mouth 2 (two) times daily.   dutasteride (AVODART) 0.5 MG capsule Take 0.5 mg by mouth every morning.   fluticasone (FLONASE) 50 MCG/ACT nasal spray Place 2 sprays into both nostrils daily.   glipiZIDE (GLUCOTROL XL) 5 MG 24 hr tablet Take 1 tablet (5 mg total) by mouth every morning.   glucose blood (ACCU-CHEK GUIDE) test strip Check  blood glucose TWICE DAILY   HYDROcodone-acetaminophen (NORCO/VICODIN) 5-325 MG tablet Take 1 tablet by mouth every 6 (six) hours as needed for moderate pain. (Patient not taking: Reported on 04/24/2021)   insulin degludec (TRESIBA FLEXTOUCH) 100 UNIT/ML FlexTouch Pen Inject 30 Units into the skin at bedtime.   Insulin Pen Needle (B-D ULTRAFINE III SHORT PEN) 31G X 8 MM MISC 1 each by Does not apply route as directed.   Lancets Thin MISC 1 each by Does not apply route 4 (four) times daily.   omeprazole (PRILOSEC) 20 MG capsule Take 1 capsule (20 mg total) by mouth daily.   tamsulosin (FLOMAX) 0.4 MG CAPS capsule Take 0.4 mg by mouth every morning.   traMADol (ULTRAM) 50 MG tablet Take 100 mg by mouth 3 (three) times daily as needed.   VELTASSA 8.4 g packet Take 1 packet by mouth daily.   [DISCONTINUED] glipiZIDE (GLUCOTROL XL) 5 MG 24 hr tablet  TAKE ONE TABLET BY MOUTH EVERY MORNING   [DISCONTINUED] insulin degludec (TRESIBA FLEXTOUCH) 100 UNIT/ML FlexTouch Pen Inject 30 Units into THE SKIN AT bedtime.   No facility-administered encounter medications on file as of 03/09/2023.    ALLERGIES: No Known Allergies  VACCINATION STATUS: Immunization History  Administered Date(s) Administered   Influenza Split 09/28/2013   Pneumococcal Polysaccharide-23 12/30/2007    Diabetes He presents for his follow-up diabetic visit. He has type 2 diabetes mellitus. Onset time: He was diagnosed at approximate age of 45 years. His disease course has been worsening. There are no hypoglycemic associated symptoms. Pertinent negatives for hypoglycemia include no confusion, headaches, pallor or seizures. Pertinent negatives for diabetes include no chest pain, no fatigue, no polydipsia, no polyphagia, no polyuria, no weakness and no weight loss. There are no hypoglycemic complications. Symptoms are improving. Diabetic complications include a CVA and nephropathy. Risk factors for coronary artery disease include diabetes mellitus, dyslipidemia, family history, male sex, hypertension, sedentary lifestyle and tobacco exposure. Current diabetic treatments: He is currently on Ozempic 0.5 mg subcutaneous weekly.  He was recently taken off of Jardiance and metformin due to CKD. His weight is stable. He is following a generally unhealthy diet. When asked about meal planning, he reported none. He participates in exercise intermittently. His home blood glucose trend is fluctuating minimally. His breakfast blood glucose range is generally 140-180 mg/dl. His bedtime blood glucose range is generally 180-200 mg/dl. His overall blood glucose range is 180-200 mg/dl. (He presents with continued improvement in his glycemic profile.  He is accompanied by his son-in-law today.  His average blood glucose has been  155- 170., for  the last 30 days.  His point-of-care A1c is  7.9% improving  from 11% .   No documented hypoglycemia.  )  Hyperlipidemia This is a chronic problem. The current episode started more than 1 year ago. Pertinent negatives include no chest pain, myalgias or shortness of breath. Current antihyperlipidemic treatment includes statins. Risk factors for coronary artery disease include dyslipidemia, diabetes mellitus, hypertension and male sex.  Hypertension This is a chronic problem. The current episode started more than 1 year ago. The problem is controlled. Pertinent negatives include no chest pain, headaches, neck pain, palpitations or shortness of breath. Risk factors for coronary artery disease include dyslipidemia, diabetes mellitus, male gender, sedentary lifestyle and smoking/tobacco exposure. Hypertensive end-organ damage includes CVA.     Review of Systems  Constitutional:  Negative for chills, fatigue, fever, unexpected weight change and weight loss.  HENT:  Negative for dental problem, mouth sores  and trouble swallowing.   Eyes:  Negative for visual disturbance.  Respiratory:  Negative for cough, choking, chest tightness, shortness of breath and wheezing.   Cardiovascular:  Negative for chest pain, palpitations and leg swelling.  Gastrointestinal:  Negative for abdominal distention, abdominal pain, constipation, diarrhea, nausea and vomiting.  Endocrine: Negative for polydipsia, polyphagia and polyuria.  Genitourinary:  Negative for dysuria, flank pain, hematuria and urgency.  Musculoskeletal:  Negative for back pain, gait problem, myalgias and neck pain.  Skin:  Negative for pallor, rash and wound.  Neurological:  Negative for seizures, syncope, weakness, numbness and headaches.  Psychiatric/Behavioral:  Negative for confusion and dysphoric mood.     Objective:       03/09/2023    4:15 PM 10/20/2022    4:02 PM 12/31/2021    3:54 PM  Vitals with BMI  Height '5\' 3"'$  '5\' 3"'$  '5\' 3"'$   Weight 163 lbs 162 lbs 13 oz 165 lbs  BMI 28.88 [id number]   Systolic 123XX123 123XX123 123XX123  Diastolic 64 66 60  Pulse 123XX123 92 76    BP 122/64   Pulse 100   Ht '5\' 3"'$  (1.6 m)   Wt 163 lb (73.9 kg)   BMI 28.87 kg/m   Wt Readings from Last 3 Encounters:  03/09/23 163 lb (73.9 kg)  10/20/22 162 lb 12.8 oz (73.8 kg)  12/31/21 165 lb (74.8 kg)     Diabetic foot exam was normal today.    CMP ( most recent) CMP     Component Value Date/Time   NA 142 07/21/2022 0000   K 5.3 (A) 07/21/2022 0000   CL 104 07/21/2022 0000   CO2 23 (A) 07/21/2022 0000   GLUCOSE 102 (H) 08/27/2018 0418   BUN 36 (A) 07/21/2022 0000   CREATININE 2.1 (A) 07/21/2022 0000   CREATININE 1.55 (H) 08/27/2018 0418   CALCIUM 9.0 07/21/2022 0000   PROT 8.2 (H) 08/23/2018 2025   ALBUMIN 4.4 07/21/2022 0000   AST 19 07/21/2022 0000   ALT 7 (A) 07/21/2022 0000   ALKPHOS 227 (A) 07/21/2022 0000   BILITOT 0.9 08/23/2018 2025   GFRNONAA 28 08/23/2021 0000   GFRAA 48 (L) 08/27/2018 0418     Diabetic Labs (most recent): Lab Results  Component Value Date   HGBA1C 7.9 (A) 10/20/2022   HGBA1C 11 08/29/2021   HGBA1C 8.1 04/12/2021     Assessment & Plan:   1. Type 2 diabetes mellitus with stage 3b chronic kidney disease, without long-term current use of insulin ([place])  - Matthew Harrell has currently uncontrolled symptomatic type 2 DM since  82 years of age. He presents with his daughter to clinic.  Due to upper respiratory tract infection, he did have loss of control in the interval.  However most recently he is averaging 137 for the last 14 days.  This is despite his A1c of 9.5% 2 months ago.  He does not report or document hypoglycemia.    Recent labs reviewed. - I had a long discussion with him about the progressive nature of diabetes and the pathology behind its complications. -his diabetes is complicated by CVA, nephropathy, and history of heavy alcohol use/abuse and he remains at a high risk for more acute and chronic complications which include CAD, CVA, CKD,  retinopathy, and neuropathy. These are all discussed in detail with him.  - I have counseled him on diet  and weight management  by adopting a carbohydrate restricted/protein rich diet. Patient is encouraged to switch  to  unprocessed or minimally processed     complex starch and increased protein intake (animal or plant source), fruits, and vegetables. -  he is advised to stick to a routine mealtimes to eat 3 meals  a day and avoid unnecessary snacks ( to snack only to correct hypoglycemia).   - he acknowledges that there is a room for improvement in his food and drink choices. - Suggestion is made for him to avoid simple carbohydrates  from his diet including Cakes, Sweet Desserts, Ice Cream, Soda (diet and regular), Sweet Tea, Candies, Chips, Cookies, Store Bought Juices, Alcohol in Excess of  1-2 drinks a day, Artificial Sweeteners,  Coffee Creamer, and "Sugar-free" Products, Lemonade. This will help patient to have more stable blood glucose profile and potentially avoid unintended weight gain.  - he will be scheduled with Jearld Fenton, RDN, CDE for diabetes education.  - I have approached him with the following individualized plan to manage  his diabetes and patient agrees:   -Patient has benefited from low-dose basal insulin.  He is advised to continue Tresiba 30 units nightly, associated with monitoring of blood glucose twice daily-before breakfast and at bedtime.   - he is encouraged to call clinic for blood glucose levels less than 70 or above 200 mg /dl x3 in a week fasting.  -He is not a candidate for metformin.  He will continue to benefit from low-dose glipizide-5 mg XL p.o. daily at breakfast.   -Due to unintended weight loss, he was taken off of incretin therapy, as well as SGLT2 inhibitors.  - Specific targets for  A1c;  LDL, HDL,  and Triglycerides were discussed with the patient.  2) Blood Pressure /Hypertension:  -His blood pressure is controlled to target.  he is advised  to continue his current medications including amlodipine 5 mg p.o. daily.  He will be considered for low-dose ACE inhibitor or ARB if no contraindications from nephrology point of view.   3) Lipids/Hyperlipidemia: He does not have recent fasting lipid panel to review.  He is advised to continue atorvastatin 20 mg p.o. nightly- side effects and precautions discussed with him.    4)  Weight/Diet:  Body mass index is 28.87 kg/m.  -  he is not a candidate for major weight loss. I discussed with him the fact that loss of 5 - 10% of his  current body weight will have the most impact on his diabetes management.  Exercise, and detailed carbohydrates information provided  -  detailed on discharge instructions. His previsit labs show TSH 5.3 without free T4.  He will have TSH and free T4 repeated before his next visit in 3 months. 5) Chronic Care/Health Maintenance:  -he  is on Statin medications and  is encouraged to initiate and continue to follow up with Ophthalmology, Dentist,  Podiatrist at least yearly or according to recommendations, and advised to   stay away from smoking. I have recommended yearly flu vaccine and pneumonia vaccine at least every 5 years; moderate intensity exercise for up to 150 minutes weekly; and  sleep for at least 7 hours a day.  - he is  advised to maintain close follow up with Soledad Nation, MD for primary care needs, as well as his other providers for optimal and coordinated care.   I spent  26  minutes in the care of the patient today including review of labs from [facility], Lipids, Thyroid Function, Hematology (current and previous including abstractions from other facilities); face-to-face time  discussing  his blood glucose readings/logs, discussing hypoglycemia and hyperglycemia episodes and symptoms, medications doses, his options of short and long term treatment based on the latest standards of care / guidelines;  discussion about incorporating lifestyle medicine;  and  documenting the encounter. Risk reduction counseling performed per USPSTF guidelines to reduce  obesity and cardiovascular risk factors.     Please refer to Patient Instructions for Blood Glucose Monitoring and Insulin/Medications Dosing Guide"  in media tab for additional information. Please  also refer to " Patient Self Inventory" in the Media  tab for reviewed elements of pertinent patient history.  Matthew Harrell participated in the discussions, expressed understanding, and voiced agreement with the above plans.  All questions were answered to his satisfaction. he is encouraged to contact clinic should he have any questions or concerns prior to his return visit.    Follow up plan: - Return in about 3 months (around 06/09/2023) for F/U with Pre-visit Labs, A1c -NV.  Glade Lloyd, MD [facility] [address] [place], [place] [address] Phone: [phone]  Fax: [phone]    03/09/2023, 5:52 PM  This note was partially dictated with voice recognition software. Similar sounding words can be transcribed inadequately or may not  be corrected upon review.

## 2023-03-09 NOTE — Patient Instructions (Signed)

## 2023-03-10 ENCOUNTER — Encounter: Payer: Self-pay | Admitting: Internal Medicine

## 2023-03-11 DIAGNOSIS — E7800 Pure hypercholesterolemia, unspecified: Secondary | ICD-10-CM | POA: Diagnosis not present

## 2023-03-11 DIAGNOSIS — E1122 Type 2 diabetes mellitus with diabetic chronic kidney disease: Secondary | ICD-10-CM | POA: Diagnosis not present

## 2023-03-11 DIAGNOSIS — I700 Atherosclerosis of aorta: Secondary | ICD-10-CM | POA: Diagnosis not present

## 2023-03-11 DIAGNOSIS — K219 Gastro-esophageal reflux disease without esophagitis: Secondary | ICD-10-CM | POA: Diagnosis not present

## 2023-03-11 DIAGNOSIS — E875 Hyperkalemia: Secondary | ICD-10-CM | POA: Diagnosis not present

## 2023-03-11 DIAGNOSIS — M19011 Primary osteoarthritis, right shoulder: Secondary | ICD-10-CM | POA: Diagnosis not present

## 2023-03-11 DIAGNOSIS — G4733 Obstructive sleep apnea (adult) (pediatric): Secondary | ICD-10-CM | POA: Diagnosis not present

## 2023-03-11 DIAGNOSIS — Z7984 Long term (current) use of oral hypoglycemic drugs: Secondary | ICD-10-CM | POA: Diagnosis not present

## 2023-03-11 DIAGNOSIS — I2510 Atherosclerotic heart disease of native coronary artery without angina pectoris: Secondary | ICD-10-CM | POA: Diagnosis not present

## 2023-03-11 DIAGNOSIS — Z794 Long term (current) use of insulin: Secondary | ICD-10-CM | POA: Diagnosis not present

## 2023-03-11 DIAGNOSIS — M19012 Primary osteoarthritis, left shoulder: Secondary | ICD-10-CM | POA: Diagnosis not present

## 2023-03-11 DIAGNOSIS — Z7982 Long term (current) use of aspirin: Secondary | ICD-10-CM | POA: Diagnosis not present

## 2023-03-11 DIAGNOSIS — Z7902 Long term (current) use of antithrombotics/antiplatelets: Secondary | ICD-10-CM | POA: Diagnosis not present

## 2023-03-11 DIAGNOSIS — N183 Chronic kidney disease, stage 3 unspecified: Secondary | ICD-10-CM | POA: Diagnosis not present

## 2023-03-11 DIAGNOSIS — J309 Allergic rhinitis, unspecified: Secondary | ICD-10-CM | POA: Diagnosis not present

## 2023-03-11 DIAGNOSIS — Z96642 Presence of left artificial hip joint: Secondary | ICD-10-CM | POA: Diagnosis not present

## 2023-03-11 DIAGNOSIS — I129 Hypertensive chronic kidney disease with stage 1 through stage 4 chronic kidney disease, or unspecified chronic kidney disease: Secondary | ICD-10-CM | POA: Diagnosis not present

## 2023-03-11 DIAGNOSIS — Z87891 Personal history of nicotine dependence: Secondary | ICD-10-CM | POA: Diagnosis not present

## 2023-03-12 DIAGNOSIS — K5900 Constipation, unspecified: Secondary | ICD-10-CM | POA: Diagnosis not present

## 2023-03-12 DIAGNOSIS — E1169 Type 2 diabetes mellitus with other specified complication: Secondary | ICD-10-CM | POA: Diagnosis not present

## 2023-03-12 DIAGNOSIS — R5383 Other fatigue: Secondary | ICD-10-CM | POA: Diagnosis not present

## 2023-03-12 DIAGNOSIS — R55 Syncope and collapse: Secondary | ICD-10-CM | POA: Diagnosis not present

## 2023-03-12 DIAGNOSIS — R030 Elevated blood-pressure reading, without diagnosis of hypertension: Secondary | ICD-10-CM | POA: Diagnosis not present

## 2023-03-12 DIAGNOSIS — G4733 Obstructive sleep apnea (adult) (pediatric): Secondary | ICD-10-CM | POA: Diagnosis not present

## 2023-03-12 DIAGNOSIS — N184 Chronic kidney disease, stage 4 (severe): Secondary | ICD-10-CM | POA: Diagnosis not present

## 2023-03-13 ENCOUNTER — Ambulatory Visit (INDEPENDENT_AMBULATORY_CARE_PROVIDER_SITE_OTHER): Payer: Medicare Other | Admitting: Pulmonary Disease

## 2023-03-13 ENCOUNTER — Encounter (HOSPITAL_BASED_OUTPATIENT_CLINIC_OR_DEPARTMENT_OTHER): Payer: Self-pay | Admitting: Pulmonary Disease

## 2023-03-13 VITALS — BP 118/58 | HR 91 | Temp 97.9°F | Ht 63.0 in | Wt 161.0 lb

## 2023-03-13 DIAGNOSIS — Z87891 Personal history of nicotine dependence: Secondary | ICD-10-CM | POA: Diagnosis not present

## 2023-03-13 DIAGNOSIS — I700 Atherosclerosis of aorta: Secondary | ICD-10-CM | POA: Diagnosis not present

## 2023-03-13 DIAGNOSIS — Z7902 Long term (current) use of antithrombotics/antiplatelets: Secondary | ICD-10-CM | POA: Diagnosis not present

## 2023-03-13 DIAGNOSIS — K219 Gastro-esophageal reflux disease without esophagitis: Secondary | ICD-10-CM | POA: Diagnosis not present

## 2023-03-13 DIAGNOSIS — J309 Allergic rhinitis, unspecified: Secondary | ICD-10-CM | POA: Diagnosis not present

## 2023-03-13 DIAGNOSIS — E875 Hyperkalemia: Secondary | ICD-10-CM | POA: Diagnosis not present

## 2023-03-13 DIAGNOSIS — G4733 Obstructive sleep apnea (adult) (pediatric): Secondary | ICD-10-CM

## 2023-03-13 DIAGNOSIS — I129 Hypertensive chronic kidney disease with stage 1 through stage 4 chronic kidney disease, or unspecified chronic kidney disease: Secondary | ICD-10-CM | POA: Diagnosis not present

## 2023-03-13 DIAGNOSIS — Z7982 Long term (current) use of aspirin: Secondary | ICD-10-CM | POA: Diagnosis not present

## 2023-03-13 DIAGNOSIS — Z96642 Presence of left artificial hip joint: Secondary | ICD-10-CM | POA: Diagnosis not present

## 2023-03-13 DIAGNOSIS — M19012 Primary osteoarthritis, left shoulder: Secondary | ICD-10-CM | POA: Diagnosis not present

## 2023-03-13 DIAGNOSIS — E7800 Pure hypercholesterolemia, unspecified: Secondary | ICD-10-CM | POA: Diagnosis not present

## 2023-03-13 DIAGNOSIS — N183 Chronic kidney disease, stage 3 unspecified: Secondary | ICD-10-CM | POA: Diagnosis not present

## 2023-03-13 DIAGNOSIS — G4734 Idiopathic sleep related nonobstructive alveolar hypoventilation: Secondary | ICD-10-CM | POA: Diagnosis not present

## 2023-03-13 DIAGNOSIS — Z794 Long term (current) use of insulin: Secondary | ICD-10-CM | POA: Diagnosis not present

## 2023-03-13 DIAGNOSIS — Z7984 Long term (current) use of oral hypoglycemic drugs: Secondary | ICD-10-CM | POA: Diagnosis not present

## 2023-03-13 DIAGNOSIS — I2510 Atherosclerotic heart disease of native coronary artery without angina pectoris: Secondary | ICD-10-CM | POA: Diagnosis not present

## 2023-03-13 DIAGNOSIS — E1122 Type 2 diabetes mellitus with diabetic chronic kidney disease: Secondary | ICD-10-CM | POA: Diagnosis not present

## 2023-03-13 DIAGNOSIS — M19011 Primary osteoarthritis, right shoulder: Secondary | ICD-10-CM | POA: Diagnosis not present

## 2023-03-13 NOTE — Assessment & Plan Note (Signed)
It is clear that he has severe OSA.  Unclear why he discontinued CPAP, due to his memory deficits difficult to discern but he is agreeable to starting again.  He has had a lapse in therapy.  We will reassess with a home sleep test. Based on this we will reinstate auto CPAP settings, he previously needed CPAP of 16 cm.  Son-in-law would prefer DME to [facility] because that is where he has his own CPAP from If needed we can schedule an attended CPAP titration study at [facility].    The pathophysiology of obstructive sleep apnea , it's cardiovascular consequences & modes of treatment including CPAP were discused with the patient in detail & they evidenced understanding.

## 2023-03-13 NOTE — Progress Notes (Signed)
Subjective:    Patient ID: Matthew Harrell, male    DOB: [birth year], 82 y.o.   MRN: [id number]  HPI  Chief Complaint  Patient presents with   sleep consult    Pt is being referred due to low O2 sats at night. Pt is not currently on any cpap therapy or oxygen therapy.    82 year old presents to establish care for OSA Last seen 2015-he was diagnosed with severe OSA with AHI 53/hour and extremely low desaturations.  He was placed on CPAP with good results.  For some reason, he stopped using his machine a few years ago.  The patient has poor memory and does not recall events and cannot give me a clear reason why he discontinued.  Most of the history is obtained from his son-in-law [name] who accompanies. He had a stroke in 2019 and this has left him with memory deficits. He has moved in with his daughter a few months ago. About 3 weeks ago he had a fall and altered mental status, head CT was negative.  During hospitalization it was noted that his oxygen level dropped during sleep. His daughter is just getting his appointments in order and wanted him to get evaluated for OSA. He has a neurology appointment pending. He also has a swallow evaluation pending.  Epworth sleepiness score is 14, he reports sleepiness while watching TV sitting inactive in a public place or lying down to rest in the afternoons.  [name] states that he wakes up for breakfast and then gets back into bed and mostly stays in bed until lunch. Bedtime is around 9:30 PM, sleep latency about 30 minutes, he sleeps on his back with 2 pillows, reports 2-3 nocturnal awakenings His weight is mostly unchanged since 2015.  PMH -diabetes type 2, on insulin Hypertension CVA-MRI showed remote left occipitoparietal infarct and small chronic cerebellar infarcts CKD Chronic active sinusitis     Significant tests/ events reviewed 12/2013 Split AHI of 53 , low sat 66%, all supine>> CPAP 16 cm - 160 lbs   Past Medical History:   Diagnosis Date   Arthritis    Diabetes ([place])    Diabetes mellitus, type II ([place])    Hypertension    Kidney disease    Stroke (cerebrum) ([facility])    Past Surgical History:  Procedure Laterality Date   NASAL SINUS SURGERY     TOTAL HIP ARTHROPLASTY     Left    No Known Allergies  Social History   Socioeconomic History   Marital status: Divorced    Spouse name: Not on file   Number of children: 3   Years of education: Not on file   Highest education level: Not on file  Occupational History   Occupation: retired  Tobacco Use   Smoking status: Former    Packs/day: 1.00    Years: 10.00    Additional pack years: 0.00    Total pack years: 10.00    Types: Cigarettes    Quit date: 12/29/1982    Years since quitting: 40.2   Smokeless tobacco: Never  Substance and Sexual Activity   Alcohol use: No   Drug use: No   Sexual activity: Not on file  Other Topics Concern   Not on file  Social History Narrative   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     Social Determinants of Health   Financial Resource Strain: Not on file  Food Insecurity: Not on file  Transportation Needs: Not on file  Physical Activity: Not  on file  Stress: Not on file  Social Connections: Not on file  Intimate Partner Violence: Not on file    Family History  Problem Relation Age of Onset   Diabetes Brother    Stomach cancer Sister    Aneurysm Mother 66   Heart attack Father 24     Review of Systems Constitutional: negative for anorexia, fevers and sweats  Eyes: negative for irritation, redness and visual disturbance  Ears, nose, mouth, throat, and face: negative for earaches, epistaxis, nasal congestion and sore throat  Respiratory: negative for cough, dyspnea on exertion, sputum and wheezing  Cardiovascular: negative for chest pain, dyspnea, lower extremity edema, orthopnea, palpitations and syncope  Gastrointestinal: negative for abdominal pain, constipation, diarrhea, melena, nausea and vomiting   Genitourinary:negative for dysuria, frequency and hematuria  Hematologic/lymphatic: negative for bleeding, easy bruising and lymphadenopathy  Musculoskeletal:negative for arthralgias, muscle weakness and stiff joints  Neurological: negative for coordination problems, gait problems, headaches and weakness  Endocrine: negative for diabetic symptoms including polydipsia, polyuria and weight loss     Objective:   Physical Exam  Gen. Pleasant, obese, in no distress, normal affect ENT - no pallor,icterus, no post nasal drip, class 3 airway Neck: No JVD, no thyromegaly, no carotid bruits Lungs: no use of accessory muscles, no dullness to percussion, decreased without rales or rhonchi  Cardiovascular: Rhythm regular, heart sounds  normal, no murmurs or gallops, no peripheral edema Abdomen: soft and non-tender, no hepatosplenomegaly, BS normal. Musculoskeletal: No deformities, no cyanosis or clubbing Neuro:  alert, non focal, no tremors       Assessment & Plan:

## 2023-03-13 NOTE — Assessment & Plan Note (Signed)
He required oxygen during sleep remotely. May need a formal attended titration to see if he still needs oxygen blended into his machine

## 2023-03-13 NOTE — Patient Instructions (Signed)
X Home sleep test  Based on this, we will get you a CPAP machine from [facility]

## 2023-03-18 DIAGNOSIS — Z87891 Personal history of nicotine dependence: Secondary | ICD-10-CM | POA: Diagnosis not present

## 2023-03-18 DIAGNOSIS — Z7982 Long term (current) use of aspirin: Secondary | ICD-10-CM | POA: Diagnosis not present

## 2023-03-18 DIAGNOSIS — I2510 Atherosclerotic heart disease of native coronary artery without angina pectoris: Secondary | ICD-10-CM | POA: Diagnosis not present

## 2023-03-18 DIAGNOSIS — E7800 Pure hypercholesterolemia, unspecified: Secondary | ICD-10-CM | POA: Diagnosis not present

## 2023-03-18 DIAGNOSIS — Z7984 Long term (current) use of oral hypoglycemic drugs: Secondary | ICD-10-CM | POA: Diagnosis not present

## 2023-03-18 DIAGNOSIS — Z794 Long term (current) use of insulin: Secondary | ICD-10-CM | POA: Diagnosis not present

## 2023-03-18 DIAGNOSIS — Z96642 Presence of left artificial hip joint: Secondary | ICD-10-CM | POA: Diagnosis not present

## 2023-03-18 DIAGNOSIS — E1122 Type 2 diabetes mellitus with diabetic chronic kidney disease: Secondary | ICD-10-CM | POA: Diagnosis not present

## 2023-03-18 DIAGNOSIS — M19011 Primary osteoarthritis, right shoulder: Secondary | ICD-10-CM | POA: Diagnosis not present

## 2023-03-18 DIAGNOSIS — N183 Chronic kidney disease, stage 3 unspecified: Secondary | ICD-10-CM | POA: Diagnosis not present

## 2023-03-18 DIAGNOSIS — I700 Atherosclerosis of aorta: Secondary | ICD-10-CM | POA: Diagnosis not present

## 2023-03-18 DIAGNOSIS — I129 Hypertensive chronic kidney disease with stage 1 through stage 4 chronic kidney disease, or unspecified chronic kidney disease: Secondary | ICD-10-CM | POA: Diagnosis not present

## 2023-03-18 DIAGNOSIS — G4733 Obstructive sleep apnea (adult) (pediatric): Secondary | ICD-10-CM | POA: Diagnosis not present

## 2023-03-18 DIAGNOSIS — K219 Gastro-esophageal reflux disease without esophagitis: Secondary | ICD-10-CM | POA: Diagnosis not present

## 2023-03-18 DIAGNOSIS — E875 Hyperkalemia: Secondary | ICD-10-CM | POA: Diagnosis not present

## 2023-03-18 DIAGNOSIS — Z7902 Long term (current) use of antithrombotics/antiplatelets: Secondary | ICD-10-CM | POA: Diagnosis not present

## 2023-03-18 DIAGNOSIS — J309 Allergic rhinitis, unspecified: Secondary | ICD-10-CM | POA: Diagnosis not present

## 2023-03-18 DIAGNOSIS — M19012 Primary osteoarthritis, left shoulder: Secondary | ICD-10-CM | POA: Diagnosis not present

## 2023-03-19 DIAGNOSIS — E049 Nontoxic goiter, unspecified: Secondary | ICD-10-CM | POA: Diagnosis not present

## 2023-03-20 DIAGNOSIS — I700 Atherosclerosis of aorta: Secondary | ICD-10-CM | POA: Diagnosis not present

## 2023-03-20 DIAGNOSIS — Z794 Long term (current) use of insulin: Secondary | ICD-10-CM | POA: Diagnosis not present

## 2023-03-20 DIAGNOSIS — I2510 Atherosclerotic heart disease of native coronary artery without angina pectoris: Secondary | ICD-10-CM | POA: Diagnosis not present

## 2023-03-20 DIAGNOSIS — Z7902 Long term (current) use of antithrombotics/antiplatelets: Secondary | ICD-10-CM | POA: Diagnosis not present

## 2023-03-20 DIAGNOSIS — G4733 Obstructive sleep apnea (adult) (pediatric): Secondary | ICD-10-CM | POA: Diagnosis not present

## 2023-03-20 DIAGNOSIS — M19011 Primary osteoarthritis, right shoulder: Secondary | ICD-10-CM | POA: Diagnosis not present

## 2023-03-20 DIAGNOSIS — E7800 Pure hypercholesterolemia, unspecified: Secondary | ICD-10-CM | POA: Diagnosis not present

## 2023-03-20 DIAGNOSIS — N183 Chronic kidney disease, stage 3 unspecified: Secondary | ICD-10-CM | POA: Diagnosis not present

## 2023-03-20 DIAGNOSIS — Z7984 Long term (current) use of oral hypoglycemic drugs: Secondary | ICD-10-CM | POA: Diagnosis not present

## 2023-03-20 DIAGNOSIS — J309 Allergic rhinitis, unspecified: Secondary | ICD-10-CM | POA: Diagnosis not present

## 2023-03-20 DIAGNOSIS — E1122 Type 2 diabetes mellitus with diabetic chronic kidney disease: Secondary | ICD-10-CM | POA: Diagnosis not present

## 2023-03-20 DIAGNOSIS — Z96642 Presence of left artificial hip joint: Secondary | ICD-10-CM | POA: Diagnosis not present

## 2023-03-20 DIAGNOSIS — I129 Hypertensive chronic kidney disease with stage 1 through stage 4 chronic kidney disease, or unspecified chronic kidney disease: Secondary | ICD-10-CM | POA: Diagnosis not present

## 2023-03-20 DIAGNOSIS — Z7982 Long term (current) use of aspirin: Secondary | ICD-10-CM | POA: Diagnosis not present

## 2023-03-20 DIAGNOSIS — K219 Gastro-esophageal reflux disease without esophagitis: Secondary | ICD-10-CM | POA: Diagnosis not present

## 2023-03-20 DIAGNOSIS — Z87891 Personal history of nicotine dependence: Secondary | ICD-10-CM | POA: Diagnosis not present

## 2023-03-20 DIAGNOSIS — E875 Hyperkalemia: Secondary | ICD-10-CM | POA: Diagnosis not present

## 2023-03-20 DIAGNOSIS — M19012 Primary osteoarthritis, left shoulder: Secondary | ICD-10-CM | POA: Diagnosis not present

## 2023-03-23 DIAGNOSIS — Z0001 Encounter for general adult medical examination with abnormal findings: Secondary | ICD-10-CM | POA: Diagnosis not present

## 2023-03-23 DIAGNOSIS — Z7902 Long term (current) use of antithrombotics/antiplatelets: Secondary | ICD-10-CM | POA: Diagnosis not present

## 2023-03-23 DIAGNOSIS — K219 Gastro-esophageal reflux disease without esophagitis: Secondary | ICD-10-CM | POA: Diagnosis not present

## 2023-03-23 DIAGNOSIS — N1831 Chronic kidney disease, stage 3a: Secondary | ICD-10-CM | POA: Diagnosis not present

## 2023-03-23 DIAGNOSIS — D649 Anemia, unspecified: Secondary | ICD-10-CM | POA: Diagnosis not present

## 2023-03-23 DIAGNOSIS — R55 Syncope and collapse: Secondary | ICD-10-CM | POA: Diagnosis not present

## 2023-03-23 DIAGNOSIS — J309 Allergic rhinitis, unspecified: Secondary | ICD-10-CM | POA: Diagnosis not present

## 2023-03-23 DIAGNOSIS — E875 Hyperkalemia: Secondary | ICD-10-CM | POA: Diagnosis not present

## 2023-03-23 DIAGNOSIS — K5900 Constipation, unspecified: Secondary | ICD-10-CM | POA: Diagnosis not present

## 2023-03-23 DIAGNOSIS — E7800 Pure hypercholesterolemia, unspecified: Secondary | ICD-10-CM | POA: Diagnosis not present

## 2023-03-23 DIAGNOSIS — R5383 Other fatigue: Secondary | ICD-10-CM | POA: Diagnosis not present

## 2023-03-23 DIAGNOSIS — I700 Atherosclerosis of aorta: Secondary | ICD-10-CM | POA: Diagnosis not present

## 2023-03-23 DIAGNOSIS — E1122 Type 2 diabetes mellitus with diabetic chronic kidney disease: Secondary | ICD-10-CM | POA: Diagnosis not present

## 2023-03-23 DIAGNOSIS — Z7982 Long term (current) use of aspirin: Secondary | ICD-10-CM | POA: Diagnosis not present

## 2023-03-23 DIAGNOSIS — G4733 Obstructive sleep apnea (adult) (pediatric): Secondary | ICD-10-CM | POA: Diagnosis not present

## 2023-03-23 DIAGNOSIS — I10 Essential (primary) hypertension: Secondary | ICD-10-CM | POA: Diagnosis not present

## 2023-03-23 DIAGNOSIS — N183 Chronic kidney disease, stage 3 unspecified: Secondary | ICD-10-CM | POA: Diagnosis not present

## 2023-03-23 DIAGNOSIS — Z794 Long term (current) use of insulin: Secondary | ICD-10-CM | POA: Diagnosis not present

## 2023-03-23 DIAGNOSIS — Z87891 Personal history of nicotine dependence: Secondary | ICD-10-CM | POA: Diagnosis not present

## 2023-03-23 DIAGNOSIS — I129 Hypertensive chronic kidney disease with stage 1 through stage 4 chronic kidney disease, or unspecified chronic kidney disease: Secondary | ICD-10-CM | POA: Diagnosis not present

## 2023-03-23 DIAGNOSIS — M19011 Primary osteoarthritis, right shoulder: Secondary | ICD-10-CM | POA: Diagnosis not present

## 2023-03-23 DIAGNOSIS — Z96642 Presence of left artificial hip joint: Secondary | ICD-10-CM | POA: Diagnosis not present

## 2023-03-23 DIAGNOSIS — M19012 Primary osteoarthritis, left shoulder: Secondary | ICD-10-CM | POA: Diagnosis not present

## 2023-03-23 DIAGNOSIS — Z7984 Long term (current) use of oral hypoglycemic drugs: Secondary | ICD-10-CM | POA: Diagnosis not present

## 2023-03-23 DIAGNOSIS — I2510 Atherosclerotic heart disease of native coronary artery without angina pectoris: Secondary | ICD-10-CM | POA: Diagnosis not present

## 2023-03-24 ENCOUNTER — Ambulatory Visit (HOSPITAL_COMMUNITY): Payer: Medicare Other | Admitting: Speech Pathology

## 2023-03-24 ENCOUNTER — Other Ambulatory Visit (HOSPITAL_COMMUNITY): Payer: Self-pay | Admitting: Occupational Therapy

## 2023-03-24 DIAGNOSIS — K219 Gastro-esophageal reflux disease without esophagitis: Secondary | ICD-10-CM

## 2023-03-24 DIAGNOSIS — E1142 Type 2 diabetes mellitus with diabetic polyneuropathy: Secondary | ICD-10-CM | POA: Diagnosis not present

## 2023-03-24 DIAGNOSIS — R55 Syncope and collapse: Secondary | ICD-10-CM | POA: Diagnosis not present

## 2023-03-24 DIAGNOSIS — I639 Cerebral infarction, unspecified: Secondary | ICD-10-CM | POA: Diagnosis not present

## 2023-03-24 DIAGNOSIS — I672 Cerebral atherosclerosis: Secondary | ICD-10-CM | POA: Diagnosis not present

## 2023-03-24 DIAGNOSIS — G4733 Obstructive sleep apnea (adult) (pediatric): Secondary | ICD-10-CM | POA: Diagnosis not present

## 2023-03-24 DIAGNOSIS — R1312 Dysphagia, oropharyngeal phase: Secondary | ICD-10-CM

## 2023-03-25 ENCOUNTER — Encounter: Payer: Self-pay | Admitting: *Deleted

## 2023-03-25 DIAGNOSIS — E875 Hyperkalemia: Secondary | ICD-10-CM | POA: Diagnosis not present

## 2023-03-25 DIAGNOSIS — G4733 Obstructive sleep apnea (adult) (pediatric): Secondary | ICD-10-CM | POA: Diagnosis not present

## 2023-03-25 DIAGNOSIS — M19012 Primary osteoarthritis, left shoulder: Secondary | ICD-10-CM | POA: Diagnosis not present

## 2023-03-25 DIAGNOSIS — Z794 Long term (current) use of insulin: Secondary | ICD-10-CM | POA: Diagnosis not present

## 2023-03-25 DIAGNOSIS — N183 Chronic kidney disease, stage 3 unspecified: Secondary | ICD-10-CM | POA: Diagnosis not present

## 2023-03-25 DIAGNOSIS — Z7984 Long term (current) use of oral hypoglycemic drugs: Secondary | ICD-10-CM | POA: Diagnosis not present

## 2023-03-25 DIAGNOSIS — M19011 Primary osteoarthritis, right shoulder: Secondary | ICD-10-CM | POA: Diagnosis not present

## 2023-03-25 DIAGNOSIS — K219 Gastro-esophageal reflux disease without esophagitis: Secondary | ICD-10-CM | POA: Diagnosis not present

## 2023-03-25 DIAGNOSIS — Z7902 Long term (current) use of antithrombotics/antiplatelets: Secondary | ICD-10-CM | POA: Diagnosis not present

## 2023-03-25 DIAGNOSIS — I2510 Atherosclerotic heart disease of native coronary artery without angina pectoris: Secondary | ICD-10-CM | POA: Diagnosis not present

## 2023-03-25 DIAGNOSIS — E7800 Pure hypercholesterolemia, unspecified: Secondary | ICD-10-CM | POA: Diagnosis not present

## 2023-03-25 DIAGNOSIS — I700 Atherosclerosis of aorta: Secondary | ICD-10-CM | POA: Diagnosis not present

## 2023-03-25 DIAGNOSIS — Z96642 Presence of left artificial hip joint: Secondary | ICD-10-CM | POA: Diagnosis not present

## 2023-03-25 DIAGNOSIS — J309 Allergic rhinitis, unspecified: Secondary | ICD-10-CM | POA: Diagnosis not present

## 2023-03-25 DIAGNOSIS — Z7982 Long term (current) use of aspirin: Secondary | ICD-10-CM | POA: Diagnosis not present

## 2023-03-25 DIAGNOSIS — Z87891 Personal history of nicotine dependence: Secondary | ICD-10-CM | POA: Diagnosis not present

## 2023-03-25 DIAGNOSIS — I129 Hypertensive chronic kidney disease with stage 1 through stage 4 chronic kidney disease, or unspecified chronic kidney disease: Secondary | ICD-10-CM | POA: Diagnosis not present

## 2023-03-25 DIAGNOSIS — E1122 Type 2 diabetes mellitus with diabetic chronic kidney disease: Secondary | ICD-10-CM | POA: Diagnosis not present

## 2023-03-26 ENCOUNTER — Ambulatory Visit: Payer: Medicare Other | Attending: Internal Medicine | Admitting: Internal Medicine

## 2023-03-26 ENCOUNTER — Telehealth: Payer: Self-pay | Admitting: Internal Medicine

## 2023-03-26 ENCOUNTER — Encounter: Payer: Self-pay | Admitting: Internal Medicine

## 2023-03-26 ENCOUNTER — Other Ambulatory Visit (INDEPENDENT_AMBULATORY_CARE_PROVIDER_SITE_OTHER): Payer: Medicare Other

## 2023-03-26 ENCOUNTER — Other Ambulatory Visit: Payer: Self-pay | Admitting: Internal Medicine

## 2023-03-26 VITALS — BP 128/72 | HR 86 | Ht 63.0 in | Wt 164.8 lb

## 2023-03-26 DIAGNOSIS — R55 Syncope and collapse: Secondary | ICD-10-CM

## 2023-03-26 DIAGNOSIS — R42 Dizziness and giddiness: Secondary | ICD-10-CM

## 2023-03-26 DIAGNOSIS — Z8673 Personal history of transient ischemic attack (TIA), and cerebral infarction without residual deficits: Secondary | ICD-10-CM | POA: Diagnosis not present

## 2023-03-26 DIAGNOSIS — I10 Essential (primary) hypertension: Secondary | ICD-10-CM

## 2023-03-26 NOTE — Patient Instructions (Signed)
Medication Instructions:  Your physician recommends that you continue on your current medications as directed. Please refer to the Current Medication list given to you today.   Labwork: none  Testing/Procedures: ZIO- Long Term Monitor Instructions   Your physician has requested you wear your ZIO patch monitor 14 days.   This is a single patch monitor.  [employer] supplies one patch monitor per enrollment.  Additional stickers are not available.   While looking in a mirror, press and release button in center of patch.  A small green light will flash 3-4 times .  This will be your only indicator the monitor has been turned on.     Do not shower for the first 24 hours.  You may shower after the first 24 hours.   Press button if you feel a symptom. You will hear a small click.  Record Date, Time and Symptom in the Patient Log Book.   When you are ready to remove patch, follow instructions on last 2 pages of Patient Log Book.  Stick patch monitor onto last page of Patient Log Book.   Place Patient Log Book in [place] box.  Use locking tab on box and tape box closed securely.  The [employer] has [employer] on it.  Please place in mailbox as soon as possible.  Your physician should have your test results approximately 7 days after the monitor has been mailed back to [facility].   Call [facility] at [phone] if you have questions regarding your ZIO XT patch monitor.  Call them immediately if you see an orange light blinking on your monitor.   If your monitor falls off in less than 4 days contact our Monitor department at [phone].  If your monitor becomes loose or falls off after 4 days call [employer] at [phone] for suggestions on securing your monitor.   Follow-Up:  Your physician recommends that you schedule a follow-up appointment in: Pending Results  Any Other Special Instructions Will Be Listed Below (If Applicable).  If you need a refill on  your cardiac medications before your next appointment, please call your pharmacy.

## 2023-03-26 NOTE — Progress Notes (Signed)
Cardiology Office Note  Date: 03/26/2023   ID: Harrell, Matthew October 17, 1941, MRN [id number]  PCP:  Madelia Nation, MD  Cardiologist:  None Electrophysiologist:  None   Reason for Office Visit: Evaluation of syncope at the request of Dr. Jimmye Norman   History of Present Illness: Matthew Harrell is a 82 y.o. male known to have HTN, DM 2, history of CVA with memory deficits, severe OSA not on CPAP was referred to cardiology clinic for evaluation of syncope.  Patient lives with his daughter and son-in-law. He is not much active at home. Watches TV mostly. He complained of some chest discomfort when he was infected with flu but outside of that episode, he did not have any angina. Denied any DOE, palpitations or leg swelling. He does feel dizzy when he gets up from a sitting position and also sometimes feels dizzy with exertion. The other day, few weeks ago, he had lunch and was walking to the bathroom where he felt dizzy and passed out on the bed. It happened couple of times. Follows up with neurology for history of CVA and currently on Plavix monotherapy. Compliant with medications and no side-effects. No bleeding complications. Denied smoking cigarettes, alcohol use and illicit drug abuse. No family history of premature ASCVD.   Past Medical History:  Diagnosis Date   Arthritis    Diabetes ([place])    Diabetes mellitus, type II ([place])    Hypertension    Kidney disease    Stroke (cerebrum) ([place])     Past Surgical History:  Procedure Laterality Date   NASAL SINUS SURGERY     TOTAL HIP ARTHROPLASTY     Left    Current Outpatient Medications  Medication Sig Dispense Refill   Acetaminophen 500 MG capsule Take by mouth as needed for fever.     albuterol (PROVENTIL HFA;VENTOLIN HFA) 108 (90 Base) MCG/ACT inhaler Inhale 1-2 puffs into the lungs every 6 (six) hours as needed for wheezing or shortness of breath.     amLODipine (NORVASC) 5 MG tablet Take 5 mg by mouth daily.      atorvastatin (LIPITOR) 40 MG tablet Take 1 tablet by mouth daily.     Blood Glucose Monitoring Suppl (CONTOUR NEXT MONITOR) w/Device KIT 1 each by Does not apply route 4 (four) times daily. 1 kit 0   clopidogrel (PLAVIX) 75 MG tablet Take 1 tablet by mouth daily.     COMFORT EZ PEN NEEDLES 32G X 4 MM MISC USE TO INJECT insulin DAILY AS DIRECTED 100 each 2   cyanocobalamin 1000 MCG tablet Take 1 tablet by mouth daily.     diclofenac sodium (VOLTAREN) 1 % GEL Apply 2 g topically 4 (four) times daily as needed (pain).      DULoxetine (CYMBALTA) 30 MG capsule Take 30 mg by mouth 2 (two) times daily.     dutasteride (AVODART) 0.5 MG capsule Take 0.5 mg by mouth every morning.     fluticasone (FLONASE) 50 MCG/ACT nasal spray Place 2 sprays into both nostrils daily.     glipiZIDE (GLUCOTROL XL) 5 MG 24 hr tablet Take 1 tablet (5 mg total) by mouth every morning. 90 tablet 1   glucose blood (ACCU-CHEK GUIDE) test strip Check blood glucose TWICE DAILY 200 strip 2   insulin degludec (TRESIBA FLEXTOUCH) 100 UNIT/ML FlexTouch Pen Inject 30 Units into the skin at bedtime. 15 mL 1   Insulin Pen Needle (B-D ULTRAFINE III SHORT PEN) 31G X 8 MM MISC 1 each by  Does not apply route as directed. 100 each 3   Lancets Thin MISC 1 each by Does not apply route 4 (four) times daily. 150 each 5   omeprazole (PRILOSEC) 20 MG capsule Take 1 capsule (20 mg total) by mouth daily.     tamsulosin (FLOMAX) 0.4 MG CAPS capsule Take 0.4 mg by mouth every morning.     VELTASSA 8.4 g packet Take 1 packet by mouth daily.     No current facility-administered medications for this visit.   Allergies:  Patient has no known allergies.   Social History: The patient  reports that he quit smoking about 40 years ago. His smoking use included cigarettes. He has a 10.00 pack-year smoking history. He has never used smokeless tobacco. He reports that he does not drink alcohol and does not use drugs.   Family History: The patient's family  history includes Aneurysm (age of onset: 91) in his mother; Diabetes in his brother; Heart attack (age of onset: 39) in his father; Stomach cancer in his sister.   ROS:  Please see the history of present illness. Otherwise, complete review of systems is positive for none.  All other systems are reviewed and negative.   Physical Exam: VS:  There were no vitals taken for this visit., BMI There is no height or weight on file to calculate BMI.  Wt Readings from Last 3 Encounters:  03/13/23 161 lb (73 kg)  03/09/23 163 lb (73.9 kg)  10/20/22 162 lb 12.8 oz (73.8 kg)    General: Patient appears comfortable at rest. HEENT: Conjunctiva and lids normal, oropharynx clear with moist mucosa. Neck: Supple, no elevated JVP or carotid bruits, no thyromegaly. Lungs: Clear to auscultation, nonlabored breathing at rest. Cardiac: Regular rate and rhythm, no S3 or significant systolic murmur, no pericardial rub. Abdomen: Soft, nontender, no hepatomegaly, bowel sounds present, no guarding or rebound. Extremities: No pitting edema, distal pulses 2+. Skin: Warm and dry. Musculoskeletal: No kyphosis. Neuropsychiatric: Alert and oriented x3, affect grossly appropriate.  ECG:  An ECG dated 03/26/2023 was personally reviewed today and demonstrated:  Normal sinus rhythm  Recent Labwork: 04/25/2022: TSH 1.82 07/21/2022: ALT 7; AST 19; BUN 36; Creatinine 2.1; Potassium 5.3; Sodium 142     Component Value Date/Time   CHOL 125 04/25/2022 0000   TRIG 81 04/25/2022 0000   HDL 38 04/25/2022 0000   LDLCALC 71 04/25/2022 0000    Other Studies Reviewed Today: Echocardiogram in 2020 The left ventricle is normal in size.  There is mild concentric left ventricular hypertrophy.  The left ventricular ejection fraction is normal (60-65%).  The left ventricular wall motion is normal.  Mild aortic sclerosis is present with good valvular opening.  Estimation of right ventricular systolic pressure is not possible.  Grade I  mild diastolic dysfunction; abnormal relaxation pattern.  Injection of contrast documented no interatrial shunt  Assessment and Plan: Patient is a 82 year old M known to have HTN, IDDM 2 with peripheral neuropathy, CVA in 2019 with vascular dementia, severe OSA not on CPAP was referred to cardiology clinic for evaluation of syncope.  # Presyncope and syncope -Orthostatic vitals during the clinic showed: Lying: 140/70, 82 Sitting: 118/62, 87 Standing at 0 minutes: 118/60, 97 Standing at 3 minutes: 126/64, 94 -Orthostatic vitals showed that patient did not meet criteria for classic and delayed orthostatic hypotension. Although blood pressure dropped from lying to sitting position, his symptoms are mainly with standing and exertion.  This could likely be secondary to autonomic dysfunction from  diabetes mellitus. Recommended to wear compression socks. Adequate hydration.  EKG today showed normal sinus rhythm, HR 80 bpm. Unlikely his presyncope/syncope is from conduction system abnormality. However will obtain 2-week event monitor to rule out intermittent conduction system abnormalities and malignant arrhythmias. Echocardiogram from 2020 at [facility] showed no evidence of structural heart disease and no valve abnormalities.  # History of CVA (multiple mini strokes per son-in-law) in 2019: Continue Plavix monotherapy, high intensity statin atorvastatin 40 mg nightly and follow-up with neurology. # HTN: Continue amlodipine 2.5 mg once daily (patient was previously on amlodipine 5 mg which dropped her BP significantly after which the dose was decreased to 2.5 mg once daily). HTN management per PCP and neurology. # HLD: Continue atorvastatin 40 mg nightly. Goal LDL<70. HLD management per PCP. # Severe OSA not on CPAP: Awaiting CPAP (diagnosed with severe OSA in 2015 but he threw his CPAP away 5 years ago). Follow-up with pulmonology  I have spent a total of 45 minutes with patient reviewing chart, EKGs, labs  and examining patient as well as establishing an assessment and plan that was discussed with the patient.  > 50% of time was spent in direct patient care.     Medication Adjustments/Labs and Tests Ordered: Current medicines are reviewed at length with the patient today.  Concerns regarding medicines are outlined above.   Tests Ordered: No orders of the defined types were placed in this encounter.   Medication Changes: No orders of the defined types were placed in this encounter.   Disposition:  Follow up  pending results  Signed Porcia Morganti Fidel Levy, MD, 03/26/2023 8:49 AM    [facility] [place], [place], [place] [address]

## 2023-03-26 NOTE — Telephone Encounter (Signed)
14 day ZIO AT dx: dizziness

## 2023-03-27 ENCOUNTER — Encounter: Payer: Self-pay | Admitting: Internal Medicine

## 2023-03-27 DIAGNOSIS — R42 Dizziness and giddiness: Secondary | ICD-10-CM | POA: Diagnosis not present

## 2023-03-29 DIAGNOSIS — E1122 Type 2 diabetes mellitus with diabetic chronic kidney disease: Secondary | ICD-10-CM | POA: Diagnosis not present

## 2023-03-29 DIAGNOSIS — I10 Essential (primary) hypertension: Secondary | ICD-10-CM | POA: Diagnosis not present

## 2023-03-29 DIAGNOSIS — K219 Gastro-esophageal reflux disease without esophagitis: Secondary | ICD-10-CM | POA: Diagnosis not present

## 2023-03-29 DIAGNOSIS — E782 Mixed hyperlipidemia: Secondary | ICD-10-CM | POA: Diagnosis not present

## 2023-03-30 DIAGNOSIS — I700 Atherosclerosis of aorta: Secondary | ICD-10-CM | POA: Diagnosis not present

## 2023-03-30 DIAGNOSIS — Z87891 Personal history of nicotine dependence: Secondary | ICD-10-CM | POA: Diagnosis not present

## 2023-03-30 DIAGNOSIS — G4733 Obstructive sleep apnea (adult) (pediatric): Secondary | ICD-10-CM | POA: Diagnosis not present

## 2023-03-30 DIAGNOSIS — M19011 Primary osteoarthritis, right shoulder: Secondary | ICD-10-CM | POA: Diagnosis not present

## 2023-03-30 DIAGNOSIS — E7800 Pure hypercholesterolemia, unspecified: Secondary | ICD-10-CM | POA: Diagnosis not present

## 2023-03-30 DIAGNOSIS — Z7984 Long term (current) use of oral hypoglycemic drugs: Secondary | ICD-10-CM | POA: Diagnosis not present

## 2023-03-30 DIAGNOSIS — J309 Allergic rhinitis, unspecified: Secondary | ICD-10-CM | POA: Diagnosis not present

## 2023-03-30 DIAGNOSIS — M19012 Primary osteoarthritis, left shoulder: Secondary | ICD-10-CM | POA: Diagnosis not present

## 2023-03-30 DIAGNOSIS — Z7902 Long term (current) use of antithrombotics/antiplatelets: Secondary | ICD-10-CM | POA: Diagnosis not present

## 2023-03-30 DIAGNOSIS — E875 Hyperkalemia: Secondary | ICD-10-CM | POA: Diagnosis not present

## 2023-03-30 DIAGNOSIS — I2510 Atherosclerotic heart disease of native coronary artery without angina pectoris: Secondary | ICD-10-CM | POA: Diagnosis not present

## 2023-03-30 DIAGNOSIS — N183 Chronic kidney disease, stage 3 unspecified: Secondary | ICD-10-CM | POA: Diagnosis not present

## 2023-03-30 DIAGNOSIS — K219 Gastro-esophageal reflux disease without esophagitis: Secondary | ICD-10-CM | POA: Diagnosis not present

## 2023-03-30 DIAGNOSIS — Z96642 Presence of left artificial hip joint: Secondary | ICD-10-CM | POA: Diagnosis not present

## 2023-03-30 DIAGNOSIS — I129 Hypertensive chronic kidney disease with stage 1 through stage 4 chronic kidney disease, or unspecified chronic kidney disease: Secondary | ICD-10-CM | POA: Diagnosis not present

## 2023-03-30 DIAGNOSIS — E1122 Type 2 diabetes mellitus with diabetic chronic kidney disease: Secondary | ICD-10-CM | POA: Diagnosis not present

## 2023-03-30 DIAGNOSIS — Z794 Long term (current) use of insulin: Secondary | ICD-10-CM | POA: Diagnosis not present

## 2023-03-30 DIAGNOSIS — Z7982 Long term (current) use of aspirin: Secondary | ICD-10-CM | POA: Diagnosis not present

## 2023-04-02 DIAGNOSIS — I700 Atherosclerosis of aorta: Secondary | ICD-10-CM | POA: Diagnosis not present

## 2023-04-02 DIAGNOSIS — I129 Hypertensive chronic kidney disease with stage 1 through stage 4 chronic kidney disease, or unspecified chronic kidney disease: Secondary | ICD-10-CM | POA: Diagnosis not present

## 2023-04-02 DIAGNOSIS — Z7902 Long term (current) use of antithrombotics/antiplatelets: Secondary | ICD-10-CM | POA: Diagnosis not present

## 2023-04-02 DIAGNOSIS — Z96642 Presence of left artificial hip joint: Secondary | ICD-10-CM | POA: Diagnosis not present

## 2023-04-02 DIAGNOSIS — Z7982 Long term (current) use of aspirin: Secondary | ICD-10-CM | POA: Diagnosis not present

## 2023-04-02 DIAGNOSIS — Z794 Long term (current) use of insulin: Secondary | ICD-10-CM | POA: Diagnosis not present

## 2023-04-02 DIAGNOSIS — G4733 Obstructive sleep apnea (adult) (pediatric): Secondary | ICD-10-CM | POA: Diagnosis not present

## 2023-04-02 DIAGNOSIS — Z87891 Personal history of nicotine dependence: Secondary | ICD-10-CM | POA: Diagnosis not present

## 2023-04-02 DIAGNOSIS — I2510 Atherosclerotic heart disease of native coronary artery without angina pectoris: Secondary | ICD-10-CM | POA: Diagnosis not present

## 2023-04-02 DIAGNOSIS — J309 Allergic rhinitis, unspecified: Secondary | ICD-10-CM | POA: Diagnosis not present

## 2023-04-02 DIAGNOSIS — E7800 Pure hypercholesterolemia, unspecified: Secondary | ICD-10-CM | POA: Diagnosis not present

## 2023-04-02 DIAGNOSIS — E875 Hyperkalemia: Secondary | ICD-10-CM | POA: Diagnosis not present

## 2023-04-02 DIAGNOSIS — M19012 Primary osteoarthritis, left shoulder: Secondary | ICD-10-CM | POA: Diagnosis not present

## 2023-04-02 DIAGNOSIS — Z7984 Long term (current) use of oral hypoglycemic drugs: Secondary | ICD-10-CM | POA: Diagnosis not present

## 2023-04-02 DIAGNOSIS — M19011 Primary osteoarthritis, right shoulder: Secondary | ICD-10-CM | POA: Diagnosis not present

## 2023-04-02 DIAGNOSIS — K219 Gastro-esophageal reflux disease without esophagitis: Secondary | ICD-10-CM | POA: Diagnosis not present

## 2023-04-02 DIAGNOSIS — N183 Chronic kidney disease, stage 3 unspecified: Secondary | ICD-10-CM | POA: Diagnosis not present

## 2023-04-02 DIAGNOSIS — E1122 Type 2 diabetes mellitus with diabetic chronic kidney disease: Secondary | ICD-10-CM | POA: Diagnosis not present

## 2023-04-06 ENCOUNTER — Ambulatory Visit: Payer: Medicare Other

## 2023-04-06 DIAGNOSIS — G4733 Obstructive sleep apnea (adult) (pediatric): Secondary | ICD-10-CM

## 2023-04-06 DIAGNOSIS — G4734 Idiopathic sleep related nonobstructive alveolar hypoventilation: Secondary | ICD-10-CM

## 2023-04-08 ENCOUNTER — Ambulatory Visit: Payer: Medicare Other | Admitting: Internal Medicine

## 2023-04-08 DIAGNOSIS — M19012 Primary osteoarthritis, left shoulder: Secondary | ICD-10-CM | POA: Diagnosis not present

## 2023-04-08 DIAGNOSIS — J309 Allergic rhinitis, unspecified: Secondary | ICD-10-CM | POA: Diagnosis not present

## 2023-04-08 DIAGNOSIS — I2510 Atherosclerotic heart disease of native coronary artery without angina pectoris: Secondary | ICD-10-CM | POA: Diagnosis not present

## 2023-04-08 DIAGNOSIS — I700 Atherosclerosis of aorta: Secondary | ICD-10-CM | POA: Diagnosis not present

## 2023-04-08 DIAGNOSIS — Z87891 Personal history of nicotine dependence: Secondary | ICD-10-CM | POA: Diagnosis not present

## 2023-04-08 DIAGNOSIS — I129 Hypertensive chronic kidney disease with stage 1 through stage 4 chronic kidney disease, or unspecified chronic kidney disease: Secondary | ICD-10-CM | POA: Diagnosis not present

## 2023-04-08 DIAGNOSIS — N183 Chronic kidney disease, stage 3 unspecified: Secondary | ICD-10-CM | POA: Diagnosis not present

## 2023-04-08 DIAGNOSIS — M19011 Primary osteoarthritis, right shoulder: Secondary | ICD-10-CM | POA: Diagnosis not present

## 2023-04-08 DIAGNOSIS — Z794 Long term (current) use of insulin: Secondary | ICD-10-CM | POA: Diagnosis not present

## 2023-04-08 DIAGNOSIS — E1122 Type 2 diabetes mellitus with diabetic chronic kidney disease: Secondary | ICD-10-CM | POA: Diagnosis not present

## 2023-04-08 DIAGNOSIS — Z96642 Presence of left artificial hip joint: Secondary | ICD-10-CM | POA: Diagnosis not present

## 2023-04-08 DIAGNOSIS — Z7984 Long term (current) use of oral hypoglycemic drugs: Secondary | ICD-10-CM | POA: Diagnosis not present

## 2023-04-08 DIAGNOSIS — Z7902 Long term (current) use of antithrombotics/antiplatelets: Secondary | ICD-10-CM | POA: Diagnosis not present

## 2023-04-08 DIAGNOSIS — E875 Hyperkalemia: Secondary | ICD-10-CM | POA: Diagnosis not present

## 2023-04-08 DIAGNOSIS — Z7982 Long term (current) use of aspirin: Secondary | ICD-10-CM | POA: Diagnosis not present

## 2023-04-08 DIAGNOSIS — K219 Gastro-esophageal reflux disease without esophagitis: Secondary | ICD-10-CM | POA: Diagnosis not present

## 2023-04-08 DIAGNOSIS — E7800 Pure hypercholesterolemia, unspecified: Secondary | ICD-10-CM | POA: Diagnosis not present

## 2023-04-08 DIAGNOSIS — G4733 Obstructive sleep apnea (adult) (pediatric): Secondary | ICD-10-CM | POA: Diagnosis not present

## 2023-04-13 DIAGNOSIS — M19012 Primary osteoarthritis, left shoulder: Secondary | ICD-10-CM | POA: Diagnosis not present

## 2023-04-13 DIAGNOSIS — K219 Gastro-esophageal reflux disease without esophagitis: Secondary | ICD-10-CM | POA: Diagnosis not present

## 2023-04-13 DIAGNOSIS — I2510 Atherosclerotic heart disease of native coronary artery without angina pectoris: Secondary | ICD-10-CM | POA: Diagnosis not present

## 2023-04-13 DIAGNOSIS — Z7982 Long term (current) use of aspirin: Secondary | ICD-10-CM | POA: Diagnosis not present

## 2023-04-13 DIAGNOSIS — Z7984 Long term (current) use of oral hypoglycemic drugs: Secondary | ICD-10-CM | POA: Diagnosis not present

## 2023-04-13 DIAGNOSIS — Z7902 Long term (current) use of antithrombotics/antiplatelets: Secondary | ICD-10-CM | POA: Diagnosis not present

## 2023-04-13 DIAGNOSIS — E875 Hyperkalemia: Secondary | ICD-10-CM | POA: Diagnosis not present

## 2023-04-13 DIAGNOSIS — Z87891 Personal history of nicotine dependence: Secondary | ICD-10-CM | POA: Diagnosis not present

## 2023-04-13 DIAGNOSIS — I700 Atherosclerosis of aorta: Secondary | ICD-10-CM | POA: Diagnosis not present

## 2023-04-13 DIAGNOSIS — G4733 Obstructive sleep apnea (adult) (pediatric): Secondary | ICD-10-CM | POA: Diagnosis not present

## 2023-04-13 DIAGNOSIS — J309 Allergic rhinitis, unspecified: Secondary | ICD-10-CM | POA: Diagnosis not present

## 2023-04-13 DIAGNOSIS — E1122 Type 2 diabetes mellitus with diabetic chronic kidney disease: Secondary | ICD-10-CM | POA: Diagnosis not present

## 2023-04-13 DIAGNOSIS — E7800 Pure hypercholesterolemia, unspecified: Secondary | ICD-10-CM | POA: Diagnosis not present

## 2023-04-13 DIAGNOSIS — I129 Hypertensive chronic kidney disease with stage 1 through stage 4 chronic kidney disease, or unspecified chronic kidney disease: Secondary | ICD-10-CM | POA: Diagnosis not present

## 2023-04-13 DIAGNOSIS — Z794 Long term (current) use of insulin: Secondary | ICD-10-CM | POA: Diagnosis not present

## 2023-04-13 DIAGNOSIS — M19011 Primary osteoarthritis, right shoulder: Secondary | ICD-10-CM | POA: Diagnosis not present

## 2023-04-13 DIAGNOSIS — N183 Chronic kidney disease, stage 3 unspecified: Secondary | ICD-10-CM | POA: Diagnosis not present

## 2023-04-13 DIAGNOSIS — Z96642 Presence of left artificial hip joint: Secondary | ICD-10-CM | POA: Diagnosis not present

## 2023-04-14 ENCOUNTER — Ambulatory Visit (HOSPITAL_COMMUNITY)
Admission: RE | Admit: 2023-04-14 | Discharge: 2023-04-14 | Disposition: A | Discharge status: Home / Self Care | Payer: Medicare Other | Source: Ambulatory Visit | Attending: Internal Medicine | Admitting: Internal Medicine

## 2023-04-14 ENCOUNTER — Encounter (HOSPITAL_COMMUNITY): Payer: Self-pay | Admitting: Speech Pathology

## 2023-04-14 ENCOUNTER — Ambulatory Visit (HOSPITAL_COMMUNITY): Payer: Medicare Other | Attending: Internal Medicine | Admitting: Speech Pathology

## 2023-04-14 DIAGNOSIS — R1312 Dysphagia, oropharyngeal phase: Secondary | ICD-10-CM | POA: Diagnosis not present

## 2023-04-14 DIAGNOSIS — R1310 Dysphagia, unspecified: Secondary | ICD-10-CM | POA: Diagnosis not present

## 2023-04-14 DIAGNOSIS — K219 Gastro-esophageal reflux disease without esophagitis: Secondary | ICD-10-CM

## 2023-04-14 DIAGNOSIS — R29898 Other symptoms and signs involving the musculoskeletal system: Secondary | ICD-10-CM | POA: Diagnosis not present

## 2023-04-14 DIAGNOSIS — R6339 Other feeding difficulties: Secondary | ICD-10-CM | POA: Diagnosis not present

## 2023-04-14 DIAGNOSIS — R209 Unspecified disturbances of skin sensation: Secondary | ICD-10-CM | POA: Diagnosis not present

## 2023-04-14 DIAGNOSIS — M542 Cervicalgia: Secondary | ICD-10-CM | POA: Diagnosis not present

## 2023-04-14 DIAGNOSIS — R519 Headache, unspecified: Secondary | ICD-10-CM | POA: Diagnosis not present

## 2023-04-14 NOTE — Therapy (Signed)
[facility] Outpatient Rehabilitation at [facility] [address] [place], [place], [address] Phone: [phone]   Fax:  [phone]  Modified Barium Swallow  Patient Details  Name: Matthew Harrell MRN: [id number] Date of Birth: 07-04-1941 No data recorded  Encounter Date: 04/14/2023   End of Session - 04/14/23 1704     Visit Number 1    Number of Visits 1    Authorization Type UHC Medicare    SLP Start Time 1335    SLP Stop Time  1402    SLP Time Calculation (min) 27 min    Activity Tolerance Patient tolerated treatment well             Past Medical History:  Diagnosis Date   Arthritis    Diabetes    Diabetes mellitus, type II    Hypertension    Kidney disease    Stroke (cerebrum)     Past Surgical History:  Procedure Laterality Date   NASAL SINUS SURGERY     TOTAL HIP ARTHROPLASTY     Left    There were no vitals filed for this visit.        General - 04/14/23 1637       General Information   Date of Onset 03/13/23    HPI Matthew Harrell is an 82 yo male who was referred by Dr. Lynann Beaver for MBSS due to Pt reports of difficulty swallowing cornbread and ground beef. Pt denies h/o recent PNA. He has a remote history of CVA in 2019 with some residual memory deficits.    Type of Study MBS-Modified Barium Swallow Study    Diet Prior to this Study Regular;Thin liquids (Level 0)    Temperature Spikes Noted No    Respiratory Status Room air    History of Recent Intubation No    Behavior/Cognition Alert;Cooperative;Pleasant mood    Oral Cavity: Oral Hygiene Within Functional Limits    Oral Care Completed by SLP No    Oral Cavity - Dentition Adequate natural dentition    Vision Functional for self feeding    Self-Feeding Abilities Able to feed self    Patient Positioning Upright in chair    Baseline Vocal Quality Normal    Volitional Cough Strong    Volitional Swallow Able to elicit    Anatomy Within functional limits    Pharyngeal  Secretions Not observed secondary MBS              04/14/23 1637  SLP Visit Information  SLP Received On 04/14/23  Pain Assessment  Pain Assessment No/denies pain  General Information  HPI Matthew Harrell is an 82 yo male who was referred by Dr. Lynann Beaver for MBSS due to Pt reports of difficulty swallowing cornbread and ground beef. Pt denies h/o recent PNA. He has a remote history of CVA in 2019 with some residual memory deficits.  Caregiver present No  Diet Prior to this Study Regular;Thin liquids (Level 0)  Temperature  Normal  Respiratory Status WFL  Supplemental O2 None (Room air)  History of Recent Intubation No  Behavior/Cognition Alert;Cooperative;Pleasant mood  Self-Feeding Abilities Able to self-feed  Baseline vocal quality/speech Normal  Volitional Cough Able to elicit  Volitional Cough Assessment Appears WFL  Volitional Swallow Able to elicit  Anatomy WFL  Orofacial Exam  Oral Cavity: Oral Hygiene WFL  Oral Cavity - Dentition Adequate natural dentition  Orofacial Anatomy WFL  Oral Motor/Sensory Function WFL  Boluses Administered  Boluses Administered Thin liquids (Level 0);Mildly thick  liquids (Level 2, nectar thick);Moderately thick liquids (Level 3, honey thick);Puree;Solid  Oral Impairment Domain  Lip Closure No labial escape  Tongue control during bolus hold Cohesive bolus between tongue to palatal seal  Bolus preparation/mastication Timely and efficient chewing and mashing  Bolus transport/lingual motion Brisk tongue motion  Oral residue Trace residue lining oral structures  Location of oral residue  Tongue  Initiation of pharyngeal swallow  Valleculae  Pharyngeal Impairment Domain  Soft palate elevation Trace column of contrast or air between SP and PW  Laryngeal elevation Partial superior movement of thyroid cartilage/partial approximation of arytenoids to epiglottic petiole  Anterior hyoid excursion Partial anterior movement  Epiglottic movement  Partial inversion  Laryngeal vestibule closure Incomplete, narrow column air/contrast in laryngeal vestibule  Pharyngeal stripping wave  Present - diminished  Pharyngeal contraction (A/P view only) N/A  Pharyngoesophageal segment opening Complete distension and complete duration, no obstruction of flow  Tongue base retraction Narrow column of contrast or air between tongue base and PPW  Pharyngeal residue Collection of residue within or on pharyngeal structures  Location of pharyngeal residue Valleculae  Esophageal Impairment Domain  Esophageal Matthew upright position Complete Matthew, esophageal coating  Pill  Consistency administered Thin liquids (Level 0)  Thin liquids (Level 0) WFL  Penetration/Aspiration Scale Score  1.  Material does not enter airway Mildly thick liquids (Level 2, nectar thick);Moderately thick liquids (Level 3, honey thick);Puree;Solid;Pill  2.  Material enters airway, remains ABOVE vocal cords then ejected out Thin liquids (Level 0)  Compensatory Strategies  Compensatory strategies Yes  Multiple swallows Effective  Clinical Impression  Clinical Impression Pt presents with mild oropharyngeal dysphagia characterized by trace lingual residuals post swallow, swallow trigger generally initiated at the posterior angle of the ramus and occasionally at the valleculae, min reduced laryngeal elevation and hyoid excursion, min reduced tongue base retraction resulting in trace residuals on blade of tongue, mi/mod at base of tongue, and one episode of flash penetration of thins during cup sip thin. Pt was successfully able to remove vallecular residue with a repeat/dry swallow. Recommend regular textures and thin liquids via cup/straw with Pt to swallow 2x for each bite/sip to remove any vallecular residue. Recommendations were written and provided to Pt. No further SLP f/u indicated at this time. Pt was encouraged to soak his cornbread in buttermilk before consuming and to  follow with liquid wash.  SLP Visit Diagnosis Dysphagia, oropharyngeal phase (R13.12)  Factors that may increase risk of adverse event in presence of aspiration Matthew Harrell & Matthew Harrell 2021) Reduced cognitive function  Exam Limitations No limitations  Swallowing Evaluation Recommendations  Recommendations PO diet  PO Diet Recommendation Regular;Thin liquids (Level 0)  Liquid Administration via Cup;Straw  Medication Administration Whole meds with liquid  Supervision Patient able to self-feed  Swallowing strategies   Multiple dry swallows after each bite/sip  Postural changes Position pt fully upright for meals;Stay upright 30-60 min after meals  Oral care recommendations Oral care BID (2x/day)  Treatment Plan  Treatment recommendations No treatment recommended at this time  Follow-up recommendations No SLP follow up       Patient will benefit from skilled therapeutic intervention in order to improve the following deficits and impairments:   Dysphagia, oropharyngeal phase   Problem List Patient Active Problem List   Diagnosis Date Noted   Syncope and collapse 03/26/2023   History of stroke 03/26/2023   Subclinical hypothyroidism 03/09/2023   Mixed hyperlipidemia 12/31/2021   Essential hypertension, benign 12/31/2021   Type 2 diabetes  mellitus with stage 3b chronic kidney disease, without long-term current use of insulin 05/14/2021   Community acquired pneumonia    Acute prerenal azotemia    OSA (obstructive sleep apnea) 02/06/2014   Nocturnal hypoxemia 12/14/2013   Abnormal CT scan, chest 11/27/2013   Cough 11/10/2013   Thank you,  Matthew Harrell, CCC-SLP [phone]  Matthew Harrell, CCC-SLP 04/14/2023, 5:05 PM  [facility] Outpatient Rehabilitation at [facility] [address] [place], [place], [address] Phone: [phone]   Fax:  [phone]  Name: Matthew Harrell MRN: [id number] Date of Birth: 05/30/1941

## 2023-04-21 ENCOUNTER — Telehealth: Payer: Self-pay | Admitting: Pulmonary Disease

## 2023-04-21 DIAGNOSIS — G4733 Obstructive sleep apnea (adult) (pediatric): Secondary | ICD-10-CM

## 2023-04-21 NOTE — Telephone Encounter (Signed)
Angie daughter would like results of patient's sleep study. Would like to know next step. Angie phone number is [phone].

## 2023-04-22 DIAGNOSIS — Z87891 Personal history of nicotine dependence: Secondary | ICD-10-CM | POA: Diagnosis not present

## 2023-04-22 DIAGNOSIS — G4733 Obstructive sleep apnea (adult) (pediatric): Secondary | ICD-10-CM | POA: Diagnosis not present

## 2023-04-22 DIAGNOSIS — N183 Chronic kidney disease, stage 3 unspecified: Secondary | ICD-10-CM | POA: Diagnosis not present

## 2023-04-22 DIAGNOSIS — I2510 Atherosclerotic heart disease of native coronary artery without angina pectoris: Secondary | ICD-10-CM | POA: Diagnosis not present

## 2023-04-22 DIAGNOSIS — I700 Atherosclerosis of aorta: Secondary | ICD-10-CM | POA: Diagnosis not present

## 2023-04-22 DIAGNOSIS — K219 Gastro-esophageal reflux disease without esophagitis: Secondary | ICD-10-CM | POA: Diagnosis not present

## 2023-04-22 DIAGNOSIS — Z7982 Long term (current) use of aspirin: Secondary | ICD-10-CM | POA: Diagnosis not present

## 2023-04-22 DIAGNOSIS — E1122 Type 2 diabetes mellitus with diabetic chronic kidney disease: Secondary | ICD-10-CM | POA: Diagnosis not present

## 2023-04-22 DIAGNOSIS — E7800 Pure hypercholesterolemia, unspecified: Secondary | ICD-10-CM | POA: Diagnosis not present

## 2023-04-22 DIAGNOSIS — M19012 Primary osteoarthritis, left shoulder: Secondary | ICD-10-CM | POA: Diagnosis not present

## 2023-04-22 DIAGNOSIS — Z7902 Long term (current) use of antithrombotics/antiplatelets: Secondary | ICD-10-CM | POA: Diagnosis not present

## 2023-04-22 DIAGNOSIS — Z7984 Long term (current) use of oral hypoglycemic drugs: Secondary | ICD-10-CM | POA: Diagnosis not present

## 2023-04-22 DIAGNOSIS — M19011 Primary osteoarthritis, right shoulder: Secondary | ICD-10-CM | POA: Diagnosis not present

## 2023-04-22 DIAGNOSIS — Z96642 Presence of left artificial hip joint: Secondary | ICD-10-CM | POA: Diagnosis not present

## 2023-04-22 DIAGNOSIS — Z794 Long term (current) use of insulin: Secondary | ICD-10-CM | POA: Diagnosis not present

## 2023-04-22 DIAGNOSIS — E875 Hyperkalemia: Secondary | ICD-10-CM | POA: Diagnosis not present

## 2023-04-22 DIAGNOSIS — I129 Hypertensive chronic kidney disease with stage 1 through stage 4 chronic kidney disease, or unspecified chronic kidney disease: Secondary | ICD-10-CM | POA: Diagnosis not present

## 2023-04-22 DIAGNOSIS — J309 Allergic rhinitis, unspecified: Secondary | ICD-10-CM | POA: Diagnosis not present

## 2023-04-22 NOTE — Telephone Encounter (Signed)
Called and spoke with angie, patients daughter. Angie wanted to know the results of the home sleep test.   RA, please advise.

## 2023-04-24 NOTE — Telephone Encounter (Signed)
Pt. Daughter said she really needs sleep study result back asap he is not doing well when he sleeps [facility] in [place] is needing the script after Dr. Vassie Loll reviews

## 2023-04-24 NOTE — Telephone Encounter (Signed)
This was done by Raven at [facility] I have sent another message just in case the first one did not go through to [facility]

## 2023-04-27 DIAGNOSIS — G4733 Obstructive sleep apnea (adult) (pediatric): Secondary | ICD-10-CM | POA: Diagnosis not present

## 2023-04-27 NOTE — Telephone Encounter (Signed)
HST showed severe OSA with AHI 26/ hr & low sat of 62% Suggest CPAP titration study asap at [facility]  We can start autoCPAP 5-15 cm in meantime while waiting on study  Sorry for the delay

## 2023-04-28 DIAGNOSIS — K219 Gastro-esophageal reflux disease without esophagitis: Secondary | ICD-10-CM | POA: Diagnosis not present

## 2023-04-28 DIAGNOSIS — E782 Mixed hyperlipidemia: Secondary | ICD-10-CM | POA: Diagnosis not present

## 2023-04-28 DIAGNOSIS — I10 Essential (primary) hypertension: Secondary | ICD-10-CM | POA: Diagnosis not present

## 2023-04-28 NOTE — Telephone Encounter (Signed)
Patient's daughter is calling again to get an update on the patient's cpap results.  Please call with an update asap.  CB# [phone]

## 2023-04-29 ENCOUNTER — Ambulatory Visit: Payer: Medicare Other | Admitting: Internal Medicine

## 2023-04-29 DIAGNOSIS — Z7902 Long term (current) use of antithrombotics/antiplatelets: Secondary | ICD-10-CM | POA: Diagnosis not present

## 2023-04-29 DIAGNOSIS — I700 Atherosclerosis of aorta: Secondary | ICD-10-CM | POA: Diagnosis not present

## 2023-04-29 DIAGNOSIS — I129 Hypertensive chronic kidney disease with stage 1 through stage 4 chronic kidney disease, or unspecified chronic kidney disease: Secondary | ICD-10-CM | POA: Diagnosis not present

## 2023-04-29 DIAGNOSIS — E875 Hyperkalemia: Secondary | ICD-10-CM | POA: Diagnosis not present

## 2023-04-29 DIAGNOSIS — M19012 Primary osteoarthritis, left shoulder: Secondary | ICD-10-CM | POA: Diagnosis not present

## 2023-04-29 DIAGNOSIS — Z87891 Personal history of nicotine dependence: Secondary | ICD-10-CM | POA: Diagnosis not present

## 2023-04-29 DIAGNOSIS — J309 Allergic rhinitis, unspecified: Secondary | ICD-10-CM | POA: Diagnosis not present

## 2023-04-29 DIAGNOSIS — Z794 Long term (current) use of insulin: Secondary | ICD-10-CM | POA: Diagnosis not present

## 2023-04-29 DIAGNOSIS — Z7982 Long term (current) use of aspirin: Secondary | ICD-10-CM | POA: Diagnosis not present

## 2023-04-29 DIAGNOSIS — N183 Chronic kidney disease, stage 3 unspecified: Secondary | ICD-10-CM | POA: Diagnosis not present

## 2023-04-29 DIAGNOSIS — M19011 Primary osteoarthritis, right shoulder: Secondary | ICD-10-CM | POA: Diagnosis not present

## 2023-04-29 DIAGNOSIS — I2510 Atherosclerotic heart disease of native coronary artery without angina pectoris: Secondary | ICD-10-CM | POA: Diagnosis not present

## 2023-04-29 DIAGNOSIS — K219 Gastro-esophageal reflux disease without esophagitis: Secondary | ICD-10-CM | POA: Diagnosis not present

## 2023-04-29 DIAGNOSIS — E1122 Type 2 diabetes mellitus with diabetic chronic kidney disease: Secondary | ICD-10-CM | POA: Diagnosis not present

## 2023-04-29 DIAGNOSIS — G4733 Obstructive sleep apnea (adult) (pediatric): Secondary | ICD-10-CM | POA: Diagnosis not present

## 2023-04-29 DIAGNOSIS — Z96642 Presence of left artificial hip joint: Secondary | ICD-10-CM | POA: Diagnosis not present

## 2023-04-29 DIAGNOSIS — Z7984 Long term (current) use of oral hypoglycemic drugs: Secondary | ICD-10-CM | POA: Diagnosis not present

## 2023-04-29 DIAGNOSIS — E7800 Pure hypercholesterolemia, unspecified: Secondary | ICD-10-CM | POA: Diagnosis not present

## 2023-04-29 NOTE — Telephone Encounter (Signed)
Called and spoke with patients daughter. She verbalized understanding. Orders for the cpap titration study and cpap machine have been placed.   Nothing further needed.

## 2023-05-06 ENCOUNTER — Ambulatory Visit: Payer: Medicare Other | Admitting: Internal Medicine

## 2023-05-08 ENCOUNTER — Telehealth (HOSPITAL_BASED_OUTPATIENT_CLINIC_OR_DEPARTMENT_OTHER): Payer: Self-pay | Admitting: Pulmonary Disease

## 2023-05-13 ENCOUNTER — Telehealth: Payer: Self-pay | Admitting: Pulmonary Disease

## 2023-05-13 NOTE — Telephone Encounter (Signed)
Order has been faxed to [facility]

## 2023-05-13 NOTE — Telephone Encounter (Signed)
PT daughter upset and ready to get a new Dr. CPAP order has not been sent to [facility] in [place] as requested. This has been going on several weeks. Please call daughter to advise of action taken. TY.  [phone]

## 2023-05-13 NOTE — Telephone Encounter (Signed)
Called and spoke w/ pt daughter letting her know that we sent the order to [facility] on 5/1 and just received a confirmation today @ 11:15 that the order was received. She verbalized understanding. NFN at the time of call.

## 2023-06-02 ENCOUNTER — Ambulatory Visit: Payer: Medicare Other | Attending: Pulmonary Disease | Admitting: Pulmonary Disease

## 2023-06-02 DIAGNOSIS — G4733 Obstructive sleep apnea (adult) (pediatric): Secondary | ICD-10-CM | POA: Insufficient documentation

## 2023-06-02 DIAGNOSIS — I493 Ventricular premature depolarization: Secondary | ICD-10-CM | POA: Insufficient documentation

## 2023-06-03 ENCOUNTER — Telehealth: Payer: Self-pay | Admitting: Pulmonary Disease

## 2023-06-03 DIAGNOSIS — G4733 Obstructive sleep apnea (adult) (pediatric): Secondary | ICD-10-CM | POA: Diagnosis not present

## 2023-06-03 NOTE — Procedures (Signed)
Patient Name: Matthew Harrell, Matthew Harrell Date: 06/02/2023 Gender: Male D.O.B: 07-15-1941 Age (years): 75 Referring Provider: Cyril Mourning MD, ABSM Height (inches): 63 Interpreting Physician: Cyril Mourning MD, ABSM Weight (lbs): 164 RPSGT: Alfonso Ellis BMI: 29 MRN: [id number] Neck Size: 19.00 <br> <br> CLINICAL INFORMATION The patient is referred for a CPAP titration to treat sleep apnea.  HST 03/2023 showed severe OSA with AHI 26/ hr & low sat of 62%  SLEEP STUDY TECHNIQUE As per the AASM Manual for the Scoring of Sleep and Associated Events v2.3 (April 2016) with a hypopnea requiring 4% desaturations.  The channels recorded and monitored were frontal, central and occipital EEG, electrooculogram (EOG), submentalis EMG (chin), nasal and oral airflow, thoracic and abdominal wall motion, anterior tibialis EMG, snore microphone, electrocardiogram, and pulse oximetry. Continuous positive airway pressure (CPAP) was initiated at the beginning of the study and titrated to treat sleep-disordered breathing.  MEDICATIONS Medications self-administered by patient taken the night of the study : N/A  TECHNICIAN COMMENTS Comments added by technician: Patient tolerated CPAP therapy fairly well, although patient had difficulties with higher pressures. Patient switched to [facility] LEVEL ST mode, for more comfort in tolerating CWP. CPAP therapy started at 4 CWP and increased to a final pressure of 18 CWP. BI LEVEL therapy started at 13/8 CWP and increased to 18/14 CWP. Patients' final setting was 18 CWP on the CPAP mode with an EPR of 3. Oral breathin observed on CPAP and BI LEVEL ST mode. Chin strap was applied, it was to no avail. ECG = Bradycardia at times, PVC's, PAC's. Patient felt well rested upon awakening Comments added by scorer: N/A RESPIRATORY PARAMETERS Optimal PAP Pressure (cm): 18 AHI at Optimal Pressure (/hr): 0 Overall Minimal O2 (%): 84.00 Supine % at Optimal Pressure (%): 100 Minimal O2 at Optimal  Pressure (%): 94.0   SLEEP ARCHITECTURE The study was initiated at 11:07:11 PM and ended at 6:08:00 AM.  Sleep onset time was 50.8 minutes and the sleep efficiency was 64.2%. The total sleep time was 270 minutes.  The patient spent 8.15% of the night in stage N1 sleep, 43.89% in stage N2 sleep, 22.22% in stage N3 and 25.7% in REM.Stage REM latency was 222.0 minutes  Wake after sleep onset was 100.1. Alpha intrusion was absent. Supine sleep was 81.48%.  CARDIAC DATA The 2 lead EKG demonstrated sinus rhythm. The mean heart rate was 82.75 beats per minute. Other EKG findings include: PVCs.   LEG MOVEMENT DATA The total Periodic Limb Movements of Sleep (PLMS) were 0. The PLMS index was 0.00. A PLMS index of <15 is considered normal in adults.  IMPRESSIONS - The optimal PAP pressure was 18 cm of water. BiPAP was also tried upto 18/14 cm - Central sleep apnea was not noted during this titration (CAI = 1.3/h). - Moderate oxygen desaturations were observed during this titration (min O2 = 84.00%). - The patient snored with moderate snoring volume during this titration study. - 2-lead EKG demonstrated: PVCs - Clinically significant periodic limb movements were not noted during this study. Arousals associated with PLMs were rare.   DIAGNOSIS - Obstructive Sleep Apnea (G47.33)   RECOMMENDATIONS - Trial of CPAP therapy on 18 cm H2O with a Medium size Fisher&Paykel Full Face Vitera mask and heated humidification. If patient is unable to tolerate high pressures, can use bilevel - Avoid alcohol, sedatives and other CNS depressants that may worsen sleep apnea and disrupt normal sleep architecture. - Sleep hygiene should be reviewed to assess factors that may improve  sleep quality. - Weight management and regular exercise should be initiated or continued. - Return to Sleep Center for re-evaluation after 4 weeks of therapy   Cyril Mourning MD Board Certified in Sleep medicine

## 2023-06-03 NOTE — Telephone Encounter (Signed)
Titration study showed need for CPAP up to 18 cm. He is currently on auto CPAP 5 to 15 cm. Please obtain CPAP download for me to review and we will change settings if needed. Please also arrange office visit with me next available in [place] for post CPAP follow-up visit

## 2023-06-10 NOTE — Telephone Encounter (Signed)
Called and spoke with patients daughter Matthew Harrell. She stated that patient hasn't gotten his cpap machine yet. She stated [facility] made it seem like they were on a waiting list for the machine but they do have supplies that the hospital gave them after patient had the sleep study. Patients daughter wants to know if this was sent to [facility] or another dme company. Routing to raven to see if she has any info on the order.

## 2023-06-11 ENCOUNTER — Other Ambulatory Visit: Payer: Self-pay | Admitting: "Endocrinology

## 2023-06-18 NOTE — Telephone Encounter (Signed)
I spoke to daughter - because of [facility] being behind on CPAPS, patient's daughter oked sending CPAP to a DME in [place]

## 2023-06-22 DIAGNOSIS — G4733 Obstructive sleep apnea (adult) (pediatric): Secondary | ICD-10-CM | POA: Diagnosis not present

## 2023-06-23 ENCOUNTER — Ambulatory Visit: Payer: Medicare Other | Admitting: "Endocrinology

## 2023-07-22 DIAGNOSIS — G4733 Obstructive sleep apnea (adult) (pediatric): Secondary | ICD-10-CM | POA: Diagnosis not present

## 2023-07-28 ENCOUNTER — Telehealth: Payer: Self-pay | Admitting: "Endocrinology

## 2023-07-28 NOTE — Telephone Encounter (Signed)
Pt needs new Accuc[employer]k guide meter called into [facility].  His current one will not charge.

## 2023-07-29 MED ORDER — ACCU-CHEK GUIDE W/DEVICE KIT: PACK | 0 refills | Status: AC

## 2023-07-29 NOTE — Telephone Encounter (Signed)
Rx sent 

## 2023-08-04 DIAGNOSIS — E1122 Type 2 diabetes mellitus with diabetic chronic kidney disease: Secondary | ICD-10-CM | POA: Diagnosis not present

## 2023-08-04 DIAGNOSIS — N170 Acute kidney failure with tubular necrosis: Secondary | ICD-10-CM | POA: Diagnosis not present

## 2023-08-04 DIAGNOSIS — N259 Disorder resulting from impaired renal tubular function, unspecified: Secondary | ICD-10-CM | POA: Diagnosis not present

## 2023-08-04 DIAGNOSIS — R5383 Other fatigue: Secondary | ICD-10-CM | POA: Diagnosis not present

## 2023-08-04 DIAGNOSIS — E049 Nontoxic goiter, unspecified: Secondary | ICD-10-CM | POA: Diagnosis not present

## 2023-08-07 DIAGNOSIS — I129 Hypertensive chronic kidney disease with stage 1 through stage 4 chronic kidney disease, or unspecified chronic kidney disease: Secondary | ICD-10-CM | POA: Diagnosis not present

## 2023-08-07 DIAGNOSIS — E1122 Type 2 diabetes mellitus with diabetic chronic kidney disease: Secondary | ICD-10-CM | POA: Diagnosis not present

## 2023-08-07 DIAGNOSIS — E875 Hyperkalemia: Secondary | ICD-10-CM | POA: Diagnosis not present

## 2023-08-07 DIAGNOSIS — D638 Anemia in other chronic diseases classified elsewhere: Secondary | ICD-10-CM | POA: Diagnosis not present

## 2023-08-22 DIAGNOSIS — G4733 Obstructive sleep apnea (adult) (pediatric): Secondary | ICD-10-CM | POA: Diagnosis not present

## 2023-08-25 ENCOUNTER — Other Ambulatory Visit: Payer: Self-pay | Admitting: "Endocrinology

## 2023-09-07 ENCOUNTER — Other Ambulatory Visit: Payer: Self-pay | Admitting: "Endocrinology

## 2023-09-15 ENCOUNTER — Other Ambulatory Visit: Payer: Self-pay

## 2023-09-15 MED ORDER — COMFORT EZ PEN NEEDLES 32G X 4 MM MISC: 0 refills | Status: DC

## 2023-09-16 ENCOUNTER — Ambulatory Visit: Payer: Medicare Other | Admitting: "Endocrinology

## 2023-09-17 ENCOUNTER — Telehealth: Payer: Self-pay | Admitting: "Endocrinology

## 2023-09-17 ENCOUNTER — Other Ambulatory Visit: Payer: Self-pay

## 2023-09-17 MED ORDER — TRESIBA FLEXTOUCH 100 UNIT/ML ~~LOC~~ SOPN: 30.0000 [IU] | PEN_INJECTOR | Freq: Every day | SUBCUTANEOUS | 0 refills | Status: DC

## 2023-09-17 NOTE — Telephone Encounter (Signed)
The fax number is [phone]

## 2023-09-17 NOTE — Telephone Encounter (Signed)
Pt's daughter is requesting a refill on his [employer] to be sent to [facility]. He no longer is using [facility].

## 2023-09-17 NOTE — Addendum Note (Signed)
Addended by: Jannifer Franklin A on: 09/17/2023 04:11 PM   Modules accepted: Orders

## 2023-09-17 NOTE — Telephone Encounter (Signed)
Rx sent 

## 2023-10-22 ENCOUNTER — Other Ambulatory Visit: Payer: Self-pay | Admitting: "Endocrinology

## 2023-11-10 ENCOUNTER — Other Ambulatory Visit: Payer: Self-pay | Admitting: *Deleted

## 2023-11-10 ENCOUNTER — Telehealth: Payer: Self-pay | Admitting: "Endocrinology

## 2023-11-10 DIAGNOSIS — E1122 Type 2 diabetes mellitus with diabetic chronic kidney disease: Secondary | ICD-10-CM | POA: Diagnosis not present

## 2023-11-10 DIAGNOSIS — E875 Hyperkalemia: Secondary | ICD-10-CM | POA: Diagnosis not present

## 2023-11-10 DIAGNOSIS — N189 Chronic kidney disease, unspecified: Secondary | ICD-10-CM | POA: Diagnosis not present

## 2023-11-10 DIAGNOSIS — D638 Anemia in other chronic diseases classified elsewhere: Secondary | ICD-10-CM | POA: Diagnosis not present

## 2023-11-10 DIAGNOSIS — N1832 Chronic kidney disease, stage 3b: Secondary | ICD-10-CM

## 2023-11-10 DIAGNOSIS — D649 Anemia, unspecified: Secondary | ICD-10-CM | POA: Diagnosis not present

## 2023-11-10 DIAGNOSIS — I129 Hypertensive chronic kidney disease with stage 1 through stage 4 chronic kidney disease, or unspecified chronic kidney disease: Secondary | ICD-10-CM | POA: Diagnosis not present

## 2023-11-10 MED ORDER — COMFORT EZ PEN NEEDLES 32G X 4 MM MISC: 0 refills | Status: DC

## 2023-11-10 NOTE — Telephone Encounter (Signed)
Pt needs Pen needles called into [facility]

## 2023-11-10 NOTE — Telephone Encounter (Signed)
Rx was sent in the patient's requested pharmacy.

## 2023-11-12 DIAGNOSIS — E1122 Type 2 diabetes mellitus with diabetic chronic kidney disease: Secondary | ICD-10-CM | POA: Diagnosis not present

## 2023-11-12 DIAGNOSIS — D638 Anemia in other chronic diseases classified elsewhere: Secondary | ICD-10-CM | POA: Diagnosis not present

## 2023-11-12 DIAGNOSIS — N184 Chronic kidney disease, stage 4 (severe): Secondary | ICD-10-CM | POA: Diagnosis not present

## 2023-11-12 DIAGNOSIS — G4733 Obstructive sleep apnea (adult) (pediatric): Secondary | ICD-10-CM | POA: Diagnosis not present

## 2023-12-26 ENCOUNTER — Other Ambulatory Visit: Payer: Self-pay | Admitting: "Endocrinology

## 2023-12-28 ENCOUNTER — Other Ambulatory Visit: Payer: Self-pay

## 2023-12-28 DIAGNOSIS — E1122 Type 2 diabetes mellitus with diabetic chronic kidney disease: Secondary | ICD-10-CM

## 2023-12-28 MED ORDER — COMFORT EZ PEN NEEDLES 32G X 4 MM MISC
0 refills | Status: DC
Start: 2023-12-28 — End: 2024-11-23

## 2024-01-13 DIAGNOSIS — E038 Other specified hypothyroidism: Secondary | ICD-10-CM | POA: Diagnosis not present

## 2024-01-14 LAB — TSH: TSH: 5.45 (ref 0.41–5.90)

## 2024-01-18 ENCOUNTER — Ambulatory Visit: Payer: Medicare Other | Admitting: "Endocrinology

## 2024-01-18 ENCOUNTER — Encounter: Payer: Self-pay | Admitting: "Endocrinology

## 2024-01-18 VITALS — BP 98/52 | HR 104 | Ht 63.0 in | Wt 170.8 lb

## 2024-01-18 DIAGNOSIS — Z794 Long term (current) use of insulin: Secondary | ICD-10-CM | POA: Insufficient documentation

## 2024-01-18 DIAGNOSIS — E1122 Type 2 diabetes mellitus with diabetic chronic kidney disease: Secondary | ICD-10-CM

## 2024-01-18 DIAGNOSIS — E782 Mixed hyperlipidemia: Secondary | ICD-10-CM | POA: Diagnosis not present

## 2024-01-18 DIAGNOSIS — N1832 Chronic kidney disease, stage 3b: Secondary | ICD-10-CM | POA: Diagnosis not present

## 2024-01-18 DIAGNOSIS — E039 Hypothyroidism, unspecified: Secondary | ICD-10-CM

## 2024-01-18 DIAGNOSIS — I10 Essential (primary) hypertension: Secondary | ICD-10-CM | POA: Diagnosis not present

## 2024-01-18 LAB — POCT GLYCOSYLATED HEMOGLOBIN (HGB A1C): HbA1c, POC (controlled diabetic range): 8.7 % — AB (ref 0.0–7.0)

## 2024-01-18 MED ORDER — LEVOTHYROXINE SODIUM 25 MCG PO TABS
25.0000 ug | ORAL_TABLET | Freq: Every day | ORAL | 1 refills | Status: DC
Start: 2024-01-18 — End: 2024-05-20

## 2024-01-18 NOTE — Patient Instructions (Signed)
                                     Advice for Weight Management  -For most of us the best way to lose weight is by diet management. Generally speaking, diet management means consuming less calories intentionally which over time brings about progressive weight loss.  This can be achieved more effectively by avoiding ultra processed carbohydrates, processed meats, unhealthy fats.    It is critically important to know your numbers: how much calorie you are consuming and how much calorie you need. More importantly, our carbohydrates sources should be unprocessed naturally occurring  complex starch food items.  It is always important to balance nutrition also by  appropriate intake of proteins (mainly plant-based), healthy fats/oils, plenty of fruits and vegetables.   -The American College of Lifestyle Medicine (ACL M) recommends nutrition derived mostly from Whole Food, Plant Predominant Sources example an apple instead of applesauce or apple pie. Eat Plenty of vegetables, Mushrooms, fruits, Legumes, Whole Grains, Nuts, seeds in lieu of processed meats, processed snacks/pastries red meat, poultry, eggs.  Use only water or unsweetened tea for hydration.  The College also recommends the need to stay away from risky substances including alcohol, smoking; obtaining 7-9 hours of restorative sleep, at least 150 minutes of moderate intensity exercise weekly, importance of healthy social connections, and being mindful of stress and seek help when it is overwhelming.    -Sticking to a routine mealtime to eat 3 meals a day and avoiding unnecessary snacks is shown to have a big role in weight control. Under normal circumstances, the only time we burn stored energy is when we are hungry, so allow  some hunger to take place- hunger means no food between appropriate meal times, only water.  It is not advisable to starve.   -It is better to avoid simple carbohydrates including:  Cakes, Sweet Desserts, Ice Cream, Soda (diet and regular), Sweet Tea, Candies, Chips, Cookies, Store Bought Juices, Alcohol in Excess of  1-2 drinks a day, Lemonade,  Artificial Sweeteners, Doughnuts, Coffee Creamers, "Sugar-free" Products, etc, etc.  This is not a complete list.....    -Consulting with certified diabetes educators is proven to provide you with the most accurate and current information on diet.  Also, you may be  interested in discussing diet options/exchanges , we can schedule a visit with Matthew Harrell, RDN, CDE for individualized nutrition education.  -Exercise: If you are able: 30 -60 minutes a day ,4 days a week, or 150 minutes of moderate intensity exercise weekly.    The longer the better if tolerated.  Combine stretch, strength, and aerobic activities.  If you were told in the past that you have high risk for cardiovascular diseases, or if you are currently symptomatic, you may seek evaluation by your heart doctor prior to initiating moderate to intense exercise programs.                                  Additional Care Considerations for Diabetes/Prediabetes   -Diabetes  is a chronic disease.  The most important care consideration is regular follow-up with your diabetes care provider with the goal being avoiding or delaying its complications and to take advantage of advances in medications and technology.  If appropriate actions are taken early enough, type 2 diabetes can even be   reversed.  Seek information from the right source.  - Whole Food, Plant Predominant Nutrition is highly recommended: Eat Plenty of vegetables, Mushrooms, fruits, Legumes, Whole Grains, Nuts, seeds in lieu of processed meats, processed snacks/pastries red meat, poultry, eggs as recommended by American College of  Lifestyle Medicine (ACLM).  -Type 2 diabetes is known to coexist with other important comorbidities such as high blood pressure and high cholesterol.  It is critical to control not only the  diabetes but also the high blood pressure and high cholesterol to minimize and delay the risk of complications including coronary artery disease, stroke, amputations, blindness, etc.  The good news is that this diet recommendation for type 2 diabetes is also very helpful for managing high cholesterol and high blood blood pressure.  - Studies showed that people with diabetes will benefit from a class of medications known as ACE inhibitors and statins.  Unless there are specific reasons not to be on these medications, the standard of care is to consider getting one from these groups of medications at an optimal doses.  These medications are generally considered safe and proven to help protect the heart and the kidneys.    - People with diabetes are encouraged to initiate and maintain regular follow-up with eye doctors, foot doctors, dentists , and if necessary heart and kidney doctors.     - It is highly recommended that people with diabetes quit smoking or stay away from smoking, and get yearly  flu vaccine and pneumonia vaccine at least every 5 years.  See above for additional recommendations on exercise, sleep, stress management , and healthy social connections.      

## 2024-01-18 NOTE — Progress Notes (Signed)
01/18/2024, 4:51 PM  Endocrinology follow-up note   Subjective:    Patient ID: Matthew Harrell, male    DOB: Apr 06, 1941.  Matthew Harrell is being seen in follow-up after he was seen in consultation for management of currently uncontrolled symptomatic diabetes requested by  Matthew Potts, MD. He is accompanied by his daughter Matthew Harrell to clinic today.  Matthew Harrell is offering to  help.  Past Medical History:  Diagnosis Date   Arthritis    Diabetes (HCC)    Diabetes mellitus, type II (HCC)    Hypertension    Kidney disease    Stroke (cerebrum) (HCC)     Past Surgical History:  Procedure Laterality Date   NASAL SINUS SURGERY     TOTAL HIP ARTHROPLASTY     Left    Social History   Socioeconomic History   Marital status: Divorced    Spouse name: Not on file   Number of children: 3   Years of education: Not on file   Highest education level: Not on file  Occupational History   Occupation: retired  Tobacco Use   Smoking status: Former    Current packs/day: 0.00    Average packs/day: 1 pack/day for 10.0 years (10.0 ttl pk-yrs)    Types: Cigarettes    Start date: 12/29/1972    Quit date: 12/29/1982    Years since quitting: 41.0   Smokeless tobacco: Never  Substance and Sexual Activity   Alcohol use: No   Drug use: No   Sexual activity: Not on file  Other Topics Concern   Not on file  Social History Narrative   Lives alone.  Retired Designer, fashion/clothing.     Social Drivers of Corporate investment banker Strain: Not on file  Food Insecurity: Not on file  Transportation Needs: Not on file  Physical Activity: Not on file  Stress: Not on file  Social Connections: Unknown (05/13/2022)   Received from [facility]   Social Network    Social Network: Not on file    Family History  Problem Relation Age of Onset   Diabetes Brother    Stomach cancer Sister    Aneurysm Mother 28   Heart attack Father 95    Outpatient Encounter  Medications as of 01/18/2024  Medication Sig   levothyroxine (SYNTHROID) 25 MCG tablet Take 1 tablet (25 mcg total) by mouth daily before breakfast.   ACCU-CHEK GUIDE test strip USE TO TEST BLOOD SUGAR TWICE DAILY   Acetaminophen 500 MG capsule Take by mouth as needed for fever.   albuterol (PROVENTIL HFA;VENTOLIN HFA) 108 (90 Base) MCG/ACT inhaler Inhale 1-2 puffs into the lungs every 6 (six) hours as needed for wheezing or shortness of breath.   amLODipine (NORVASC) 5 MG tablet Take 5 mg by mouth daily.   atorvastatin (LIPITOR) 40 MG tablet Take 1 tablet by mouth daily.   Blood Glucose Monitoring Suppl (ACCU-CHEK GUIDE) w/Device KIT Use to test BG qid. Dx E11.65   clopidogrel (PLAVIX) 75 MG tablet Take 1 tablet by mouth daily.   cyanocobalamin 1000 MCG tablet Take 1 tablet by mouth daily.   diclofenac sodium (VOLTAREN) 1 % GEL Apply 2 g topically 4 (four) times daily as needed (pain).    DULoxetine (CYMBALTA) 30 MG capsule Take 30 mg by mouth 2 (two) times daily.   dutasteride (AVODART) 0.5 MG capsule Take 0.5 mg by mouth every morning.   fluticasone (FLONASE) 50 MCG/ACT nasal spray Place  2 sprays into both nostrils daily.   glipiZIDE (GLUCOTROL XL) 5 MG 24 hr tablet TAKE 1 TABLET BY MOUTH EVERY MORNING   insulin degludec (TRESIBA FLEXTOUCH) 100 UNIT/ML FlexTouch Pen INJECT 30 UNITS SUBCUTANEOUSLY AT BEDTIME   Insulin Pen Needle (B-D ULTRAFINE III SHORT PEN) 31G X 8 MM MISC 1 each by Does not apply route as directed.   Insulin Pen Needle (COMFORT EZ PEN NEEDLES) 32G X 4 MM MISC USE TO INJECT INSULIN DAILY AS DIRECTED *REFILL REQUEST*   Lancets Thin MISC 1 each by Does not apply route 4 (four) times daily.   omeprazole (PRILOSEC) 20 MG capsule Take 1 capsule (20 mg total) by mouth daily.   tamsulosin (FLOMAX) 0.4 MG CAPS capsule Take 0.4 mg by mouth every morning.   VELTASSA 8.4 g packet Take 1 packet by mouth daily.   No facility-administered encounter medications on file as of 01/18/2024.     ALLERGIES: No Known Allergies  VACCINATION STATUS: Immunization History  Administered Date(s) Administered   Influenza Split 09/28/2013   Influenza,inj,Quad PF,6+ Mos 10/02/2017   Influenza-Unspecified 10/03/2016   Pneumococcal Conjugate-13 10/11/2014   Pneumococcal Polysaccharide-23 12/30/2007, 11/11/2012    Diabetes He presents for his follow-up diabetic visit. He has type 2 diabetes mellitus. Onset time: He was diagnosed at approximate age of 40 years. His disease course has been improving. There are no hypoglycemic associated symptoms. Pertinent negatives for hypoglycemia include no confusion, headaches, pallor or seizures. Pertinent negatives for diabetes include no chest pain, no fatigue, no polydipsia, no polyphagia, no polyuria, no weakness and no weight loss. There are no hypoglycemic complications. Symptoms are improving. Diabetic complications include a CVA and nephropathy. Risk factors for coronary artery disease include diabetes mellitus, dyslipidemia, family history, male sex, hypertension, sedentary lifestyle and tobacco exposure. Current diabetic treatments: He is currently on Ozempic 0.5 mg subcutaneous weekly.  He was recently taken off of Jardiance and metformin due to CKD. His weight is fluctuating minimally. He is following a generally unhealthy diet. When asked about meal planning, he reported none. He participates in exercise intermittently. His home blood glucose trend is decreasing steadily. His breakfast blood glucose range is generally 140-180 mg/dl. His bedtime blood glucose range is generally 140-180 mg/dl. His overall blood glucose range is 140-180 mg/dl. (He presents with continued improvement in his glycemic profile, recently averaging 164-173 blood glucose average over the last 30 days.  His point-of-care A1c is 8.7%, overall improving from 11%.  He did not document any hypoglycemia.    )  Hyperlipidemia This is a chronic problem. The current episode started  more than 1 year ago. Pertinent negatives include no chest pain, myalgias or shortness of breath. Current antihyperlipidemic treatment includes statins. Risk factors for coronary artery disease include dyslipidemia, diabetes mellitus, hypertension and male sex.  Hypertension This is a chronic problem. The current episode started more than 1 year ago. The problem is controlled. Pertinent negatives include no chest pain, headaches, neck pain, palpitations or shortness of breath. Risk factors for coronary artery disease include dyslipidemia, diabetes mellitus, male gender, sedentary lifestyle and smoking/tobacco exposure. Hypertensive end-organ damage includes CVA.     Review of Systems  Constitutional:  Negative for chills, fatigue, fever, unexpected weight change and weight loss.  HENT:  Negative for dental problem, mouth sores and trouble swallowing.   Eyes:  Negative for visual disturbance.  Respiratory:  Negative for cough, choking, chest tightness, shortness of breath and wheezing.   Cardiovascular:  Negative for chest pain, palpitations and  leg swelling.  Gastrointestinal:  Negative for abdominal distention, abdominal pain, constipation, diarrhea, nausea and vomiting.  Endocrine: Negative for polydipsia, polyphagia and polyuria.  Genitourinary:  Negative for dysuria, flank pain, hematuria and urgency.  Musculoskeletal:  Negative for back pain, gait problem, myalgias and neck pain.  Skin:  Negative for pallor, rash and wound.  Neurological:  Negative for seizures, syncope, weakness, numbness and headaches.  Psychiatric/Behavioral:  Negative for confusion and dysphoric mood.     Objective:       01/18/2024    3:18 PM 03/26/2023    8:52 AM 03/13/2023    2:59 PM  Vitals with BMI  Height 5\' 3"  5\' 3"  5\' 3"   Weight 170 lbs 13 oz 164 lbs 13 oz 161 lbs  BMI 30.26 29.2 28.53  Systolic 98 128 118  Diastolic 52 72 58  Pulse 104 86 91    BP (!) 98/52   Pulse (!) 104   Ht 5\' 3"  (1.6 m)    Wt 170 lb 12.8 oz (77.5 kg)   BMI 30.26 kg/m   Wt Readings from Last 3 Encounters:  01/18/24 170 lb 12.8 oz (77.5 kg)  03/26/23 164 lb 12.8 oz (74.8 kg)  03/13/23 161 lb (73 kg)     Diabetic foot exam was normal today.    CMP ( most recent) CMP     Component Value Date/Time   NA 142 07/21/2022 0000   K 5.3 (A) 07/21/2022 0000   CL 104 07/21/2022 0000   CO2 23 (A) 07/21/2022 0000   GLUCOSE 102 (H) 08/27/2018 0418   BUN 36 (A) 07/21/2022 0000   CREATININE 2.1 (A) 07/21/2022 0000   CREATININE 1.55 (H) 08/27/2018 0418   CALCIUM 9.0 07/21/2022 0000   PROT 8.2 (H) 08/23/2018 2025   ALBUMIN 4.4 07/21/2022 0000   AST 19 07/21/2022 0000   ALT 7 (A) 07/21/2022 0000   ALKPHOS 227 (A) 07/21/2022 0000   BILITOT 0.9 08/23/2018 2025   GFRNONAA 28 08/23/2021 0000   GFRAA 48 (L) 08/27/2018 0418     Diabetic Labs (most recent): Lab Results  Component Value Date   HGBA1C 8.7 (A) 01/18/2024   HGBA1C 7.9 (A) 10/20/2022   HGBA1C 11 08/29/2021     Assessment & Plan:   1. Type 2 diabetes mellitus with stage 3b chronic kidney disease, without long-term current use of insulin (HCC)  - Matthew Harrell has currently uncontrolled symptomatic type 2 DM since  83 years of age.  He presents with continued improvement in his glycemic profile, recently averaging 164-173 blood glucose average over the last 30 days.  His point-of-care A1c is 8.7%, overall improving from 11%.  He did not document any hypoglycemia.     Recent labs reviewed. - I had a long discussion with him about the progressive nature of diabetes and the pathology behind its complications. -his diabetes is complicated by CVA, nephropathy, and history of heavy alcohol use/abuse and he remains at a high risk for more acute and chronic complications which include CAD, CVA, CKD, retinopathy, and neuropathy. These are all discussed in detail with him.  - I have counseled him on diet  and weight management  by adopting a carbohydrate  restricted/protein rich diet. Patient is encouraged to switch to  unprocessed or minimally processed     complex starch and increased protein intake (animal or plant source), fruits, and vegetables. -  he is advised to stick to a routine mealtimes to eat 3 meals  a day and  avoid unnecessary snacks ( to snack only to correct hypoglycemia).   - he acknowledges that there is a room for improvement in his food and drink choices. - Suggestion is made for him to avoid simple carbohydrates  from his diet including Cakes, Sweet Desserts, Ice Cream, Soda (diet and regular), Sweet Tea, Candies, Chips, Cookies, Store Bought Juices, Alcohol in Excess of  1-2 drinks a day, Artificial Sweeteners,  Coffee Creamer, and "Sugar-free" Products, Lemonade. This will help patient to have more stable blood glucose profile and potentially avoid unintended weight gain.  - he will be scheduled with Matthew Harrell, RDN, CDE for diabetes education.  - I have approached him with the following individualized plan to manage  his diabetes and patient agrees:   -Patient has benefited from low-dose basal insulin.  He is advised to continue Tresiba 30 units nightly,  associated with monitoring of blood glucose twice daily-before breakfast and at bedtime.   - he is encouraged to call clinic for blood glucose levels less than 70 or above 200 mg /dl x3 in a week fasting.  -He is not a candidate for metformin.  He will continue to benefit from low-dose glipizide-5 mg XL p.o. daily at breakfast.   -Due to unintended weight loss, he was taken off of incretin therapy, as well as SGLT2 inhibitors.  - Specific targets for  A1c;  LDL, HDL,  and Triglycerides were discussed with the patient.  2) Blood Pressure /Hypertension:  -His blood pressure is controlled to target.  he is advised to continue his current medications including amlodipine 5 mg p.o. daily.  He will be considered for low-dose ACE inhibitor or ARB if no contraindications  from nephrology point of view.   3) Lipids/Hyperlipidemia: He does not have recent fasting lipid panel to review.  He is advised to continue atorvastatin 20 mg p.o. nightly- side effects and precautions discussed with him.    4)  Weight/Diet:  Body mass index is 30.26 kg/m.  -  he is not a candidate for major weight loss. I discussed with him the fact that loss of 5 - 10% of his  current body weight will have the most impact on his diabetes management.  Exercise, and detailed carbohydrates information provided  -  detailed on discharge instructions. 5) hypothyroidism His previsit labs show continued abnormal TSH above 5.4 with low normal free T4.  He will benefit from low-dose levothyroxine.  I discussed and prescribed levothyroxine 25 mcg p.o. daily before breakfast.    6) Chronic Care/Health Maintenance:  -he  is on Statin medications and  is encouraged to initiate and continue to follow up with Ophthalmology, Dentist,  Podiatrist at least yearly or according to recommendations, and advised to   stay away from smoking. I have recommended yearly flu vaccine and pneumonia vaccine at least every 5 years; moderate intensity exercise for up to 150 minutes weekly; and  sleep for at least 7 hours a day.  - he is  advised to maintain close follow up with Matthew Potts, MD for primary care needs, as well as his other providers for optimal and coordinated care.   I spent  26  minutes in the care of the patient today including review of labs from CMP, Lipids, Thyroid Function, Hematology (current and previous including abstractions from other facilities); face-to-face time discussing  his blood glucose readings/logs, discussing hypoglycemia and hyperglycemia episodes and symptoms, medications doses, his options of short and long term treatment based on the latest standards  of care / guidelines;  discussion about incorporating lifestyle medicine;  and documenting the encounter. Risk reduction counseling  performed per USPSTF guidelines to reduce  obesity and cardiovascular risk factors.     Please refer to Patient Instructions for Blood Glucose Monitoring and Insulin/Medications Dosing Guide"  in media tab for additional information. Please  also refer to " Patient Self Inventory" in the Media  tab for reviewed elements of pertinent patient history.  Tipton Cast participated in the discussions, expressed understanding, and voiced agreement with the above plans.  All questions were answered to his satisfaction. he is encouraged to contact clinic should he have any questions or concerns prior to his return visit.    Follow up plan: - Return in about 6 months (around 07/17/2024) for F/U with Pre-visit Labs, Meter/CGM/Logs, A1c here.  Marquis Lunch, MD [facility] [address] [place], [place] [address] Phone: [phone]  Fax: [phone]    01/18/2024, 4:51 PM  This note was partially dictated with voice recognition software. Similar sounding words can be transcribed inadequately or may not  be corrected upon review.

## 2024-03-01 DIAGNOSIS — R809 Proteinuria, unspecified: Secondary | ICD-10-CM | POA: Diagnosis not present

## 2024-03-01 DIAGNOSIS — N189 Chronic kidney disease, unspecified: Secondary | ICD-10-CM | POA: Diagnosis not present

## 2024-03-01 DIAGNOSIS — D631 Anemia in chronic kidney disease: Secondary | ICD-10-CM | POA: Diagnosis not present

## 2024-03-01 DIAGNOSIS — I129 Hypertensive chronic kidney disease with stage 1 through stage 4 chronic kidney disease, or unspecified chronic kidney disease: Secondary | ICD-10-CM | POA: Diagnosis not present

## 2024-03-03 DIAGNOSIS — N184 Chronic kidney disease, stage 4 (severe): Secondary | ICD-10-CM | POA: Diagnosis not present

## 2024-03-03 DIAGNOSIS — I129 Hypertensive chronic kidney disease with stage 1 through stage 4 chronic kidney disease, or unspecified chronic kidney disease: Secondary | ICD-10-CM | POA: Diagnosis not present

## 2024-03-03 DIAGNOSIS — D638 Anemia in other chronic diseases classified elsewhere: Secondary | ICD-10-CM | POA: Diagnosis not present

## 2024-03-03 DIAGNOSIS — G4733 Obstructive sleep apnea (adult) (pediatric): Secondary | ICD-10-CM | POA: Diagnosis not present

## 2024-03-28 DIAGNOSIS — K219 Gastro-esophageal reflux disease without esophagitis: Secondary | ICD-10-CM | POA: Diagnosis not present

## 2024-03-28 DIAGNOSIS — I700 Atherosclerosis of aorta: Secondary | ICD-10-CM | POA: Diagnosis not present

## 2024-03-28 DIAGNOSIS — E7800 Pure hypercholesterolemia, unspecified: Secondary | ICD-10-CM | POA: Diagnosis not present

## 2024-03-28 DIAGNOSIS — R0600 Dyspnea, unspecified: Secondary | ICD-10-CM | POA: Diagnosis not present

## 2024-03-28 DIAGNOSIS — E875 Hyperkalemia: Secondary | ICD-10-CM | POA: Diagnosis not present

## 2024-03-28 DIAGNOSIS — R0989 Other specified symptoms and signs involving the circulatory and respiratory systems: Secondary | ICD-10-CM | POA: Diagnosis not present

## 2024-03-28 DIAGNOSIS — G459 Transient cerebral ischemic attack, unspecified: Secondary | ICD-10-CM | POA: Diagnosis not present

## 2024-03-28 DIAGNOSIS — I517 Cardiomegaly: Secondary | ICD-10-CM | POA: Diagnosis not present

## 2024-03-31 DIAGNOSIS — D649 Anemia, unspecified: Secondary | ICD-10-CM | POA: Diagnosis not present

## 2024-03-31 DIAGNOSIS — N189 Chronic kidney disease, unspecified: Secondary | ICD-10-CM | POA: Diagnosis not present

## 2024-03-31 DIAGNOSIS — E039 Hypothyroidism, unspecified: Secondary | ICD-10-CM | POA: Diagnosis not present

## 2024-03-31 DIAGNOSIS — Z13220 Encounter for screening for lipoid disorders: Secondary | ICD-10-CM | POA: Diagnosis not present

## 2024-03-31 DIAGNOSIS — D631 Anemia in chronic kidney disease: Secondary | ICD-10-CM | POA: Diagnosis not present

## 2024-03-31 DIAGNOSIS — Z131 Encounter for screening for diabetes mellitus: Secondary | ICD-10-CM | POA: Diagnosis not present

## 2024-03-31 DIAGNOSIS — E559 Vitamin D deficiency, unspecified: Secondary | ICD-10-CM | POA: Diagnosis not present

## 2024-03-31 DIAGNOSIS — K219 Gastro-esophageal reflux disease without esophagitis: Secondary | ICD-10-CM | POA: Diagnosis not present

## 2024-04-07 DIAGNOSIS — Z0001 Encounter for general adult medical examination with abnormal findings: Secondary | ICD-10-CM | POA: Diagnosis not present

## 2024-04-07 DIAGNOSIS — G459 Transient cerebral ischemic attack, unspecified: Secondary | ICD-10-CM | POA: Diagnosis not present

## 2024-04-07 DIAGNOSIS — Z1389 Encounter for screening for other disorder: Secondary | ICD-10-CM | POA: Diagnosis not present

## 2024-04-07 DIAGNOSIS — E7800 Pure hypercholesterolemia, unspecified: Secondary | ICD-10-CM | POA: Diagnosis not present

## 2024-04-07 DIAGNOSIS — G4733 Obstructive sleep apnea (adult) (pediatric): Secondary | ICD-10-CM | POA: Diagnosis not present

## 2024-04-07 DIAGNOSIS — E875 Hyperkalemia: Secondary | ICD-10-CM | POA: Diagnosis not present

## 2024-04-07 DIAGNOSIS — D509 Iron deficiency anemia, unspecified: Secondary | ICD-10-CM | POA: Diagnosis not present

## 2024-05-04 ENCOUNTER — Other Ambulatory Visit: Payer: Self-pay

## 2024-05-04 DIAGNOSIS — N1832 Chronic kidney disease, stage 3b: Secondary | ICD-10-CM

## 2024-05-04 MED ORDER — LANCETS THIN MISC
1.0000 | Freq: Four times a day (QID) | 2 refills | Status: DC
Start: 2024-05-04 — End: 2024-10-19

## 2024-05-09 DIAGNOSIS — G459 Transient cerebral ischemic attack, unspecified: Secondary | ICD-10-CM | POA: Diagnosis not present

## 2024-05-09 DIAGNOSIS — H6122 Impacted cerumen, left ear: Secondary | ICD-10-CM | POA: Diagnosis not present

## 2024-05-09 DIAGNOSIS — E7800 Pure hypercholesterolemia, unspecified: Secondary | ICD-10-CM | POA: Diagnosis not present

## 2024-05-09 DIAGNOSIS — H6121 Impacted cerumen, right ear: Secondary | ICD-10-CM | POA: Diagnosis not present

## 2024-05-09 DIAGNOSIS — G4733 Obstructive sleep apnea (adult) (pediatric): Secondary | ICD-10-CM | POA: Diagnosis not present

## 2024-05-09 DIAGNOSIS — E875 Hyperkalemia: Secondary | ICD-10-CM | POA: Diagnosis not present

## 2024-05-12 ENCOUNTER — Inpatient Hospital Stay: Attending: Oncology | Admitting: Oncology

## 2024-05-12 ENCOUNTER — Inpatient Hospital Stay

## 2024-05-12 VITALS — BP 121/62 | HR 101 | Temp 98.0°F | Resp 20 | Ht 63.0 in | Wt 173.9 lb

## 2024-05-12 DIAGNOSIS — N189 Chronic kidney disease, unspecified: Secondary | ICD-10-CM | POA: Insufficient documentation

## 2024-05-12 DIAGNOSIS — D631 Anemia in chronic kidney disease: Secondary | ICD-10-CM

## 2024-05-12 DIAGNOSIS — I129 Hypertensive chronic kidney disease with stage 1 through stage 4 chronic kidney disease, or unspecified chronic kidney disease: Secondary | ICD-10-CM | POA: Insufficient documentation

## 2024-05-12 DIAGNOSIS — D649 Anemia, unspecified: Secondary | ICD-10-CM | POA: Insufficient documentation

## 2024-05-12 DIAGNOSIS — E611 Iron deficiency: Secondary | ICD-10-CM | POA: Diagnosis not present

## 2024-05-12 DIAGNOSIS — E1122 Type 2 diabetes mellitus with diabetic chronic kidney disease: Secondary | ICD-10-CM | POA: Diagnosis not present

## 2024-05-12 LAB — CBC WITH DIFFERENTIAL/PLATELET
Abs Immature Granulocytes: 0.06 10*3/uL (ref 0.00–0.07)
Basophils Absolute: 0.1 10*3/uL (ref 0.0–0.1)
Basophils Relative: 1 %
Eosinophils Absolute: 0.2 10*3/uL (ref 0.0–0.5)
Eosinophils Relative: 3 %
HCT: 34.9 % — ABNORMAL LOW (ref 39.0–52.0)
Hemoglobin: 10.6 g/dL — ABNORMAL LOW (ref 13.0–17.0)
Immature Granulocytes: 1 %
Lymphocytes Relative: 20 %
Lymphs Abs: 1.6 10*3/uL (ref 0.7–4.0)
MCH: 30.3 pg (ref 26.0–34.0)
MCHC: 30.4 g/dL (ref 30.0–36.0)
MCV: 99.7 fL (ref 80.0–100.0)
Monocytes Absolute: 0.7 10*3/uL (ref 0.1–1.0)
Monocytes Relative: 9 %
Neutro Abs: 5.1 10*3/uL (ref 1.7–7.7)
Neutrophils Relative %: 66 %
Platelets: 234 10*3/uL (ref 150–400)
RBC: 3.5 MIL/uL — ABNORMAL LOW (ref 4.22–5.81)
RDW: 12.9 % (ref 11.5–15.5)
WBC: 7.7 10*3/uL (ref 4.0–10.5)
nRBC: 0 % (ref 0.0–0.2)

## 2024-05-12 LAB — FOLATE: Folate: 20 ng/mL (ref >5.9)

## 2024-05-12 LAB — LACTATE DEHYDROGENASE: LDH: 168 U/L (ref 98–192)

## 2024-05-12 LAB — VITAMIN B12: Vitamin B-12: 345 pg/mL (ref 180–914)

## 2024-05-12 LAB — IRON AND TIBC
Iron: 57 ug/dL (ref 45–182)
Saturation Ratios: 16 % — ABNORMAL LOW (ref 17.9–39.5)
TIBC: 365 ug/dL (ref 250–450)
UIBC: 308 ug/dL

## 2024-05-12 LAB — FERRITIN: Ferritin: 50 ng/mL (ref 24–336)

## 2024-05-12 NOTE — Progress Notes (Signed)
 [facility] at [facility]  HEMATOLOGY NEW VISIT  Lauran Pollard, MD  REASON FOR REFERRAL: Normocytic anemia  HISTORY OF PRESENT ILLNESS: Matthew Harrell 83 y.o. male referred for normocytic anemia. The patient is accompanied by his daughter today.  Patient has a past medical history of chronic kidney disease, diabetes, hypertension.  He follows with Dr. Carrolyn Clan for chronic kidney disease.He experiences significant fatigue and spends most of his time in bed.  He has no other complaints today and reported that he has been doing very well.  Denies blood in stools, loss of appetite, loss of weight, melena.  Patient is a non-smoker, nonalcoholic.  Lives with his daughter and son-in-law.   I have reviewed the past medical history, past surgical history, social history and family history with the patient   ALLERGIES:  has no known allergies.  MEDICATIONS:  Current Outpatient Medications  Medication Sig Dispense Refill   ACCU-CHEK GUIDE test strip USE TO TEST BLOOD SUGAR TWICE DAILY 100 strip 10   Acetaminophen  500 MG capsule Take by mouth as needed for fever.     albuterol (PROVENTIL HFA;VENTOLIN HFA) 108 (90 Base) MCG/ACT inhaler Inhale 1-2 puffs into the lungs every 6 (six) hours as needed for wheezing or shortness of breath.     amLODipine  (NORVASC ) 2.5 MG tablet Take 2.5 mg by mouth daily.     atorvastatin (LIPITOR) 40 MG tablet Take 1 tablet by mouth daily.     Blood Glucose Monitoring Suppl (ACCU-CHEK GUIDE) w/Device KIT Use to test BG qid. Dx E11.65 1 kit 0   clopidogrel (PLAVIX) 75 MG tablet Take 1 tablet by mouth daily.     cyanocobalamin 1000 MCG tablet Take 1 tablet by mouth daily.     diclofenac sodium (VOLTAREN) 1 % GEL Apply 2 g topically 4 (four) times daily as needed (pain).      DULoxetine (CYMBALTA) 30 MG capsule Take 30 mg by mouth 2 (two) times daily.     dutasteride (AVODART) 0.5 MG capsule Take 0.5 mg by mouth every morning.     fluticasone  (FLONASE) 50 MCG/ACT nasal spray Place 2 sprays into both nostrils daily.     glipiZIDE  (GLUCOTROL  XL) 5 MG 24 hr tablet TAKE 1 TABLET BY MOUTH EVERY MORNING 30 tablet 10   insulin degludec  (TRESIBA  FLEXTOUCH) 100 UNIT/ML FlexTouch Pen INJECT 30 UNITS SUBCUTANEOUSLY AT BEDTIME 15 mL 10   Insulin Pen Needle (B-D ULTRAFINE III SHORT PEN) 31G X 8 MM MISC 1 each by Does not apply route as directed. 100 each 3   Insulin Pen Needle (COMFORT EZ PEN NEEDLES) 32G X 4 MM MISC USE TO INJECT INSULIN DAILY AS DIRECTED *REFILL REQUEST* 100 each 0   Lancets Thin MISC 1 each by Does not apply route 4 (four) times daily. 200 each 2   levothyroxine  (SYNTHROID ) 25 MCG tablet Take 1 tablet (25 mcg total) by mouth daily before breakfast. 90 tablet 1   omeprazole  (PRILOSEC) 20 MG capsule Take 1 capsule (20 mg total) by mouth daily.     sodium bicarbonate 650 MG tablet Take 650 mg by mouth 3 (three) times daily.     tamsulosin (FLOMAX) 0.4 MG CAPS capsule Take 0.4 mg by mouth every morning.     VELTASSA 8.4 g packet Take 1 packet by mouth daily.     No current facility-administered medications for this visit.     REVIEW OF SYSTEMS:   Constitutional: Denies fevers, chills or night sweats Eyes: Denies  blurriness of vision Ears, nose, mouth, throat, and face: Denies mucositis or sore throat Respiratory: Denies cough, dyspnea or wheezes Cardiovascular: Denies palpitation, chest discomfort or lower extremity swelling Gastrointestinal:  Denies nausea, heartburn or change in bowel habits Skin: Denies abnormal skin rashes Lymphatics: Denies new lymphadenopathy or easy bruising Neurological:Denies numbness, tingling or new weaknesses Behavioral/Psych: Mood is stable, no new changes  All other systems were reviewed with the patient and are negative.  PHYSICAL EXAMINATION:   Vitals:   05/12/24 1500  BP: 121/62  Pulse: (!) 101  Resp: 20  Temp: 98 F (36.7 C)  SpO2: 94%    GENERAL:alert, no distress and  comfortable SKIN: skin color, texture, turgor are normal, no rashes or significant lesions LUNGS: clear to auscultation and percussion with normal breathing effort HEART: regular rate & rhythm and no murmurs and no lower extremity edema ABDOMEN:abdomen soft, non-tender and normal bowel sounds Musculoskeletal:no cyanosis of digits and no clubbing  NEURO: alert & oriented x 3 with fluent speech  LABORATORY DATA:  I have reviewed the data as listed and labs reviewed from primary care office done on 03/31/2024: CBC: WBC: 7.9 hemoglobin: 10.6, hematocrit: 30.8, MCV: 92.4, platelets: 261  Anemia panel from [facility] done on 04/07/2024: Iron: 76, TSAT: 23, ferritin: 70, TIBC: 331, vitamin B12: 431, folate: 20 CBC: WBC: 7.5, hemoglobin: 11, hematocrit: 33.4, MCV: 96, platelets: 238  Labs from 03/01/2024: CMP: Sodium: 144, potassium: 4.5, creatinine: 1.9, GFR: 33  Lab Results  Component Value Date   WBC 4.8 08/23/2021   NEUTROABS 3.00 08/23/2021   HGB 10.3 (A) 04/12/2021   HCT 31 (A) 08/23/2021   MCV 99.7 08/25/2018   PLT 320 08/25/2018      Chemistry      Component Value Date/Time   NA 142 07/21/2022 0000   K 5.3 (A) 07/21/2022 0000   CL 104 07/21/2022 0000   CO2 23 (A) 07/21/2022 0000   BUN 36 (A) 07/21/2022 0000   CREATININE 2.1 (A) 07/21/2022 0000   CREATININE 1.55 (H) 08/27/2018 0418   GLU 232 07/21/2022 0000      Component Value Date/Time   CALCIUM 9.0 07/21/2022 0000   ALKPHOS 227 (A) 07/21/2022 0000   AST 19 07/21/2022 0000   ALT 7 (A) 07/21/2022 0000   BILITOT 0.9 08/23/2018 2025       RADIOGRAPHIC STUDIES: I have personally reviewed the radiological images as listed and agreed with the findings in the report.  None to review   ASSESSMENT & PLAN:  Patient is a 83 y.o. male referred for normocytic anemia  Normocytic anemia Patient has normocytic anemia likely secondary to chronic kidney disease with a competent of iron deficiency. Last iron labs from February  were within normal limits, TSAT slightly less at 23.  Could not find multiple myeloma workup in his records Patient does not remember when his last colonoscopy was done  - Will repeat labs today including iron panel, ferritin, folate, vitamin B12 and multiple myeloma labs - If multiple myeloma workup is negative, iron within normal limits with a TSAT goal of 25-30 and hemoglobin less than 10 will consider for ESA shots - If TSAT is less than 25 will consider IV iron for iron supplementation.  Patient already has chronic constipation and cannot tolerate oral iron  Return to clinic in 2 weeks to discuss results and further management   Orders Placed This Encounter  Procedures   Kappa/lambda light chains    Standing Status:   Future  Number of Occurrences:   1    Expected Date:   05/12/2024    Expiration Date:   05/12/2025   Multiple Myeloma Panel (SPEP&IFE w/QIG)    Standing Status:   Future    Number of Occurrences:   1    Expected Date:   05/12/2024    Expiration Date:   05/12/2025   Beta 2 microglobulin, serum    Standing Status:   Future    Number of Occurrences:   1    Expected Date:   05/12/2024    Expiration Date:   05/12/2025   Lactate dehydrogenase    Standing Status:   Future    Number of Occurrences:   1    Expected Date:   05/12/2024    Expiration Date:   05/12/2025   Ferritin    Standing Status:   Future    Number of Occurrences:   1    Expected Date:   05/12/2024    Expiration Date:   05/12/2025   Folate    Standing Status:   Future    Number of Occurrences:   1    Expected Date:   05/12/2024    Expiration Date:   05/12/2025   Vitamin B12    Standing Status:   Future    Number of Occurrences:   1    Expected Date:   05/12/2024    Expiration Date:   05/12/2025   Iron and TIBC    Standing Status:   Future    Number of Occurrences:   1    Expected Date:   05/12/2024    Expiration Date:   05/12/2025   CBC with Differential/Platelet    Standing Status:   Future     Number of Occurrences:   1    Expected Date:   05/12/2024    Expiration Date:   05/12/2025    The total time spent in the appointment was 40 minutes encounter with patients including review of chart and various tests results, discussions about plan of care and coordination of care plan   All questions were answered. The patient knows to call the clinic with any problems, questions or concerns. No barriers to learning was detected.   Eduardo Grade, MD 5/15/20254:05 PM

## 2024-05-12 NOTE — Patient Instructions (Signed)
 VISIT SUMMARY:  You came in today for an evaluation of your anemia, which is likely related to your stage 4 chronic kidney disease. We discussed your significant fatigue and the impact of your previous stroke on your daily life. We also reviewed your diabetes management and your current medications for constipation.  YOUR PLAN:  -ANEMIA IN CKD STAGE 4: Your anemia is likely due to your chronic kidney disease, which affects the production of a hormone needed to make red blood cells. We will order blood tests to check your iron levels and rule out multiple myeloma. If your iron levels are low and your insurance approves, we will start iron infusions. If not, we may consider iron pills, but they could worsen your constipation. If your hemoglobin is below 10 g/dL and other causes are ruled out, we may start a medication to stimulate red blood cell production.   INSTRUCTIONS:  Please schedule a follow-up appointment in 2-4 weeks to review your lab results and discuss further management. We will also coordinate your care with your nephrologist, Dr. Carrolyn Clan, as needed.

## 2024-05-12 NOTE — Assessment & Plan Note (Addendum)
 Patient has normocytic anemia likely secondary to chronic kidney disease with a competent of iron deficiency. Last iron labs from February were within normal limits, TSAT slightly less at 23.  Could not find multiple myeloma workup in his records Patient does not remember when his last colonoscopy was done  - Will repeat labs today including iron panel, ferritin, folate, vitamin B12 and multiple myeloma labs - If multiple myeloma workup is negative, iron within normal limits with a TSAT goal of 25-30 and hemoglobin less than 10 will consider for ESA shots - If TSAT is less than 25 will consider IV iron for iron supplementation.  Patient already has chronic constipation and cannot tolerate oral iron  Return to clinic in 2 weeks to discuss results and further management

## 2024-05-13 ENCOUNTER — Encounter: Payer: Self-pay | Admitting: Nurse Practitioner

## 2024-05-13 ENCOUNTER — Ambulatory Visit: Attending: Nurse Practitioner | Admitting: Nurse Practitioner

## 2024-05-13 VITALS — BP 134/82 | HR 74 | Ht 63.0 in | Wt 171.8 lb

## 2024-05-13 DIAGNOSIS — Z8673 Personal history of transient ischemic attack (TIA), and cerebral infarction without residual deficits: Secondary | ICD-10-CM | POA: Diagnosis not present

## 2024-05-13 DIAGNOSIS — I10 Essential (primary) hypertension: Secondary | ICD-10-CM

## 2024-05-13 DIAGNOSIS — R0602 Shortness of breath: Secondary | ICD-10-CM

## 2024-05-13 DIAGNOSIS — R0609 Other forms of dyspnea: Secondary | ICD-10-CM

## 2024-05-13 DIAGNOSIS — G4733 Obstructive sleep apnea (adult) (pediatric): Secondary | ICD-10-CM | POA: Diagnosis not present

## 2024-05-13 LAB — BETA 2 MICROGLOBULIN, SERUM: Beta-2 Microglobulin: 3.4 mg/L — ABNORMAL HIGH (ref 0.6–2.4)

## 2024-05-13 LAB — KAPPA/LAMBDA LIGHT CHAINS
Kappa free light chain: 39.4 mg/L — ABNORMAL HIGH (ref 3.3–19.4)
Kappa, lambda light chain ratio: 1.15 (ref 0.26–1.65)
Lambda free light chains: 34.3 mg/L — ABNORMAL HIGH (ref 5.7–26.3)

## 2024-05-13 NOTE — Patient Instructions (Addendum)
 Medication Instructions:  Your physician recommends that you continue on your current medications as directed. Please refer to the Current Medication list given to you today.  Labwork: 2-3 weeks at [facility]   Testing/Procedures: Your physician has requested that you have an echocardiogram. Echocardiography is a painless test that uses sound waves to create images of your heart. It provides your doctor with information about the size and shape of your heart and how well your heart's chambers and valves are working. This procedure takes approximately one hour. There are no restrictions for this procedure. Please do NOT wear cologne, perfume, aftershave, or lotions (deodorant is allowed). Please arrive 15 minutes prior to your appointment time.  Please note: We ask at that you not bring children with you during ultrasound (echo/ vascular) testing. Due to room size and safety concerns, children are not allowed in the ultrasound rooms during exams. Our front office staff cannot provide observation of children in our lobby area while testing is being conducted. An adult accompanying a patient to their appointment will only be allowed in the ultrasound room at the discretion of the ultrasound technician under special circumstances. We apologize for any inconvenience.  Follow-Up: Your physician recommends that you schedule a follow-up appointment in: 6 weeks   Any Other Special Instructions Will Be Listed Below (If Applicable).  If you need a refill on your cardiac medications before your next appointment, please call your pharmacy.

## 2024-05-13 NOTE — Progress Notes (Signed)
 Cardiology Office Note:  .   Date: 05/13/2024 ID:  Matthew Harrell, DOB 31-Aug-1941, MRN [id number] PCP: Lauran Pollard, MD  [facility] Providers Cardiologist:  Lasalle Pointer, MD    History of Present Illness: .   Matthew Harrell is a 83 y.o. male with a PMH of hypertension, type 2 diabetes, OSA (not on CPAP), history of CVA, who presents today for scheduled follow-up.  Previously evaluated in March 2020 for her evaluation of syncope.  Noted feeling dizzy when getting up from a sitting position, noticed some dizziness with exertion.  Patient had stated he had lunch and was walking to the bathroom a few weeks prior to office visit when he felt dizzy and passed out on the bed.  Patient stated happened a couple times.  Was following neurology.  Reported mainly being sedentary.  Denied any chest pain, leg swelling, or palpitations.  Was felt that his symptoms could be likely secondary to autonomic dysfunction from diabetes.  2-week event monitor was overall benign-see report below.  Today he presents for intermittent shortness of breath evaluation. He presents with his daughter.  Patient lives with daughter who works from home and she has noticed that he gets short of breath when going to the bathroom over the past 6 months.  Notices intermittent shortness of breath.  Notices that he "sleeps a lot."  Gives out also when showering. Denies any chest pain, palpitations, syncope, presyncope, dizziness, orthopnea, PND, swelling or significant weight changes, acute bleeding, or claudication.  PCP wanted him to be evaluated and daughter also ask about if our office received EKG as there was a recent abnormal finding.  I reviewed the chart and have not seen this EKG fax her office at this time.  ROS: Negative.  See HPI.  Studies Reviewed: Aaron Aas    EKG: EKG is not ordered today.  I will request EKG from PCP's office.   Cardiac monitor 05/2023:   Predominantly normal sinus rhythm ranging from 59  to 158 bpm with average HR 85 bpm   22 runs of SVT, fastest lasting 2.5 seconds and longest lasting 12.8 seconds   No atrial or ventricular arrhythmias   No AV block or pauses   <1% PAC and <!% PVC burden   No patient triggered events  Physical Exam:   VS:  BP 134/82   Pulse 74   Ht 5\' 3"  (1.6 m)   Wt 171 lb 12.8 oz (77.9 kg)   SpO2 98%   BMI 30.43 kg/m    Wt Readings from Last 3 Encounters:  05/13/24 171 lb 12.8 oz (77.9 kg)  05/12/24 173 lb 14.4 oz (78.9 kg)  01/18/24 170 lb 12.8 oz (77.5 kg)    GEN: Well nourished, well developed in no acute distress NECK: No JVD; No carotid bruits CARDIAC: S1/S2, RRR, no murmurs, rubs, gallops RESPIRATORY:  Clear to auscultation without rales, wheezing or rhonchi  ABDOMEN: Soft, non-tender, non-distended EXTREMITIES:  No edema; No deformity   ASSESSMENT AND PLAN: .    HTN BP stable and at goal. Discussed to monitor BP at home at least 2 hours after medications and sitting for 5-10 minutes.  No medication changes at this time.  Low-salt, heart healthy diet recommended  DOE Etiology unclear.  Will arrange echocardiogram for further evaluation of his symptoms.  Will also obtain proBNP.  Continue current medication regimen.  Continue follow-up with PCP.  Care and ED precautions discussed.  OSA Encouraged continued compliance of CPAP machine.  He  verbalized understanding.  Hx of CVA Denies any recent strokelike symptoms.  Continue current medication regimen.  Continue follow-up with PCP.   Dispo: Follow-up with MD/APP in 6 weeks or sooner anything changes.  Signed, Lasalle Pointer, NP

## 2024-05-17 LAB — MULTIPLE MYELOMA PANEL, SERUM
Albumin SerPl Elph-Mcnc: 3.5 g/dL (ref 2.9–4.4)
Albumin/Glob SerPl: 1.1 (ref 0.7–1.7)
Alpha 1: 0.3 g/dL (ref 0.0–0.4)
Alpha2 Glob SerPl Elph-Mcnc: 1 g/dL (ref 0.4–1.0)
B-Globulin SerPl Elph-Mcnc: 1.1 g/dL (ref 0.7–1.3)
Gamma Glob SerPl Elph-Mcnc: 0.8 g/dL (ref 0.4–1.8)
Globulin, Total: 3.2 g/dL (ref 2.2–3.9)
IgA: 163 mg/dL (ref 61–437)
IgG (Immunoglobin G), Serum: 958 mg/dL (ref 603–1613)
IgM (Immunoglobulin M), Srm: 22 mg/dL (ref 15–143)
Total Protein ELP: 6.7 g/dL (ref 6.0–8.5)

## 2024-05-19 ENCOUNTER — Other Ambulatory Visit: Payer: Self-pay | Admitting: "Endocrinology

## 2024-05-26 ENCOUNTER — Inpatient Hospital Stay: Admitting: Oncology

## 2024-05-26 VITALS — BP 144/64 | HR 95 | Temp 97.6°F | Resp 19 | Ht 63.0 in | Wt 173.5 lb

## 2024-05-26 DIAGNOSIS — D649 Anemia, unspecified: Secondary | ICD-10-CM | POA: Diagnosis not present

## 2024-05-26 DIAGNOSIS — N189 Chronic kidney disease, unspecified: Secondary | ICD-10-CM | POA: Diagnosis not present

## 2024-05-26 DIAGNOSIS — I129 Hypertensive chronic kidney disease with stage 1 through stage 4 chronic kidney disease, or unspecified chronic kidney disease: Secondary | ICD-10-CM | POA: Diagnosis not present

## 2024-05-26 DIAGNOSIS — E1122 Type 2 diabetes mellitus with diabetic chronic kidney disease: Secondary | ICD-10-CM | POA: Diagnosis not present

## 2024-05-26 DIAGNOSIS — E611 Iron deficiency: Secondary | ICD-10-CM | POA: Diagnosis not present

## 2024-05-26 MED ORDER — FERROUS SULFATE 325 (65 FE) MG PO TBEC: 325.0000 mg | DELAYED_RELEASE_TABLET | ORAL | 3 refills | Status: AC

## 2024-05-26 NOTE — Assessment & Plan Note (Addendum)
 Anemia secondary to CKD and iron deficiency. Hemoglobin 10.6 g/dL.  Iron deficiency confirmed with a TSAT of 16.  Multiple myeloma ruled out-SPEP: No M spike, IFE normal.  Slight elevation of free light chains with normal ratio No overt GI bleeding, occult bleeding investigation needed.  Patient never had a colonoscopy/endoscopy.  -Will administer IV iron for faster response.  Discussed adverse effects including allergic reactions, nausea and headaches.  Patient is in agreement - Prescribed oral iron every other day. - Order stool test for occult bleeding-Cologuard.  - Follow up in six to eight weeks to assess treatment response.

## 2024-05-26 NOTE — Progress Notes (Signed)
  Cancer Center at [facility]  HEMATOLOGY FOLLOW-UP VISIT  Matthew Pollard, MD  REASON FOR FOLLOW-UP: Normocytic anemia  ASSESSMENT & PLAN:  Patient is a 83 y.o. male following for normocytic anemia  Anemia Anemia secondary to CKD and iron deficiency. Hemoglobin 10.6 g/dL.  Iron deficiency confirmed with a TSAT of 16.  Multiple myeloma ruled out-SPEP: No M spike, IFE normal.  Slight elevation of free light chains with normal ratio No overt GI bleeding, occult bleeding investigation needed.  Patient never had a colonoscopy/endoscopy.  -Will administer IV iron for faster response.  Discussed adverse effects including allergic reactions, nausea and headaches.  Patient is in agreement - Prescribed oral iron every other day. - Order stool test for occult bleeding-Cologuard.  - Follow up in six to eight weeks to assess treatment response.   Orders Placed This Encounter  Procedures   Occult blood x 1 card to lab, stool    Standing Status:   Future    Expected Date:   05/26/2024    Expiration Date:   05/26/2025   Ferritin    Standing Status:   Future    Expected Date:   07/25/2024    Expiration Date:   05/26/2025   Folate    Standing Status:   Future    Expected Date:   07/25/2024    Expiration Date:   05/26/2025   Vitamin B12    Standing Status:   Future    Expected Date:   07/25/2024    Expiration Date:   05/26/2025   CBC with Differential/Platelet    Standing Status:   Future    Expected Date:   07/25/2024    Expiration Date:   05/26/2025   Comprehensive metabolic panel with GFR    Standing Status:   Future    Expected Date:   07/25/2024    Expiration Date:   05/26/2025   Iron and TIBC    Standing Status:   Future    Expected Date:   07/25/2024    Expiration Date:   05/26/2025    The total time spent in the appointment was 20 minutes encounter with patients including review of chart and various tests results, discussions about plan of care and  coordination of care plan   All questions were answered. The patient knows to call the clinic with any problems, questions or concerns. No barriers to learning was detected.  Eduardo Grade, MD 5/29/20254:24 PM   SUMMARY OF HEMATOLOGIC HISTORY: Iron deficiency anemia along with anemia of chronic kidney disease - Patient never had endoscopy colonoscopy   INTERVAL HISTORY: Matthew Harrell 83 y.o. male following for normocytic anemia. Patient was accompanied by daughter today.  He has no complaints today.  Denies hematochezia, melena, abdominal pain, fatigue.   I have reviewed the past medical history, past surgical history, social history and family history with the patient   ALLERGIES:  has no known allergies.  MEDICATIONS:  Current Outpatient Medications  Medication Sig Dispense Refill   ACCU-CHEK GUIDE test strip USE TO TEST BLOOD SUGAR TWICE DAILY 100 strip 10   Acetaminophen  500 MG capsule Take by mouth as needed for fever.     albuterol (PROVENTIL HFA;VENTOLIN HFA) 108 (90 Base) MCG/ACT inhaler Inhale 1-2 puffs into the lungs every 6 (six) hours as needed for wheezing or shortness of breath.     amLODipine  (NORVASC ) 2.5 MG tablet Take 2.5 mg by mouth daily.     atorvastatin (LIPITOR) 40 MG tablet Take  1 tablet by mouth daily.     Blood Glucose Monitoring Suppl (ACCU-CHEK GUIDE) w/Device KIT Use to test BG qid. Dx E11.65 1 kit 0   clopidogrel (PLAVIX) 75 MG tablet Take 1 tablet by mouth daily.     cyanocobalamin 1000 MCG tablet Take 1 tablet by mouth daily.     diclofenac sodium (VOLTAREN) 1 % GEL Apply 2 g topically 4 (four) times daily as needed (pain).      DULoxetine (CYMBALTA) 30 MG capsule Take 30 mg by mouth 2 (two) times daily.     dutasteride (AVODART) 0.5 MG capsule Take 0.5 mg by mouth every morning.     ferrous sulfate 325 (65 FE) MG EC tablet Take 1 tablet (325 mg total) by mouth every other day. 45 tablet 3   fluticasone (FLONASE) 50 MCG/ACT nasal spray Place 2  sprays into both nostrils daily.     glipiZIDE  (GLUCOTROL  XL) 5 MG 24 hr tablet TAKE 1 TABLET BY MOUTH EVERY MORNING 30 tablet 10   insulin degludec  (TRESIBA  FLEXTOUCH) 100 UNIT/ML FlexTouch Pen INJECT 30 UNITS SUBCUTANEOUSLY AT BEDTIME 15 mL 10   Insulin Pen Needle (B-D ULTRAFINE III SHORT PEN) 31G X 8 MM MISC 1 each by Does not apply route as directed. 100 each 3   Insulin Pen Needle (COMFORT EZ PEN NEEDLES) 32G X 4 MM MISC USE TO INJECT INSULIN DAILY AS DIRECTED *REFILL REQUEST* 100 each 0   Lancets Thin MISC 1 each by Does not apply route 4 (four) times daily. 200 each 2   levothyroxine  (SYNTHROID ) 25 MCG tablet TAKE 1 TABLET BY MOUTH DAILY BEFORE BREAKFAST 90 tablet 0   omeprazole  (PRILOSEC) 20 MG capsule Take 1 capsule (20 mg total) by mouth daily.     sodium bicarbonate 650 MG tablet Take 650 mg by mouth 3 (three) times daily.     tamsulosin (FLOMAX) 0.4 MG CAPS capsule Take 0.4 mg by mouth every morning.     VELTASSA 8.4 g packet Take 1 packet by mouth daily.     No current facility-administered medications for this visit.     REVIEW OF SYSTEMS:   Constitutional: Denies fevers, chills or night sweats Eyes: Denies blurriness of vision Ears, nose, mouth, throat, and face: Denies mucositis or sore throat Respiratory: Denies cough, dyspnea or wheezes Cardiovascular: Denies palpitation, chest discomfort or lower extremity swelling Gastrointestinal:  Denies nausea, heartburn or change in bowel habits Skin: Denies abnormal skin rashes Lymphatics: Denies new lymphadenopathy or easy bruising Neurological:Denies numbness, tingling or new weaknesses Behavioral/Psych: Mood is stable, no new changes  All other systems were reviewed with the patient and are negative.  PHYSICAL EXAMINATION:   Vitals:   05/26/24 1406 05/26/24 1411  BP: (!) 147/61 (!) 144/64  Pulse: 95   Resp: 19   Temp: 97.6 F (36.4 C)   SpO2: 97%     GENERAL:alert, no distress and comfortable SKIN: skin color,  texture, turgor are normal, no rashes or significant lesions LUNGS: clear to auscultation and percussion with normal breathing effort HEART: regular rate & rhythm and no murmurs and no lower extremity edema ABDOMEN:abdomen soft, non-tender and normal bowel sounds Musculoskeletal:no cyanosis of digits and no clubbing  NEURO: alert & oriented x 3 with fluent speech  LABORATORY DATA:  I have reviewed the data as listed  Lab Results  Component Value Date   WBC 7.7 05/12/2024   NEUTROABS 5.1 05/12/2024   HGB 10.6 (L) 05/12/2024   HCT 34.9 (L) 05/12/2024  MCV 99.7 05/12/2024   PLT 234 05/12/2024       Chemistry      Component Value Date/Time   NA 142 07/21/2022 0000   K 5.3 (A) 07/21/2022 0000   CL 104 07/21/2022 0000   CO2 23 (A) 07/21/2022 0000   BUN 36 (A) 07/21/2022 0000   CREATININE 2.1 (A) 07/21/2022 0000   CREATININE 1.55 (H) 08/27/2018 0418   GLU 232 07/21/2022 0000      Component Value Date/Time   CALCIUM 9.0 07/21/2022 0000   ALKPHOS 227 (A) 07/21/2022 0000   AST 19 07/21/2022 0000   ALT 7 (A) 07/21/2022 0000   BILITOT 0.9 08/23/2018 2025      Latest Reference Range & Units 05/12/24 15:27 05/12/24 15:28  Iron 45 - 182 ug/dL 57   UIBC ug/dL 865   TIBC 784 - 696 ug/dL 295   Saturation Ratios 17.9 - 39.5 % 16 (L)   Ferritin 24 - 336 ng/mL 50   Folate >5.9 ng/mL  20.0  Beta-2 Microglobulin 0.6 - 2.4 mg/L 3.4 (H)   Vitamin B12 180 - 914 pg/mL 345   Total Protein ELP 6.0 - 8.5 g/dL  6.7 (C)  Albumin SerPl Elph-Mcnc 2.9 - 4.4 g/dL  3.5 (C)  Albumin/Glob SerPl 0.7 - 1.7   1.1 (C)  Alpha2 Glob SerPl Elph-Mcnc 0.4 - 1.0 g/dL  1.0 (C)  Alpha 1 0.0 - 0.4 g/dL  0.3 (C)  Gamma Glob SerPl Elph-Mcnc 0.4 - 1.8 g/dL  0.8 (C)  M Protein SerPl Elph-Mcnc Not Observed g/dL  Not Observed (C)  IFE 1   Comment (C)  Globulin, Total 2.2 - 3.9 g/dL  3.2 (C)  B-Globulin SerPl Elph-Mcnc 0.7 - 1.3 g/dL  1.1 (C)  IgG (Immunoglobin G), Serum 603 - 1,613 mg/dL  284  IgM  (Immunoglobulin M), Srm 15 - 143 mg/dL  22  IgA 61 - 132 mg/dL  440  (L): Data is abnormally low (H): Data is abnormally high (C): Corrected   Latest Reference Range & Units 05/12/24 15:27  Kappa free light chain 3.3 - 19.4 mg/L 39.4 (H)  Lambda free light chains 5.7 - 26.3 mg/L 34.3 (H)  Kappa, lambda light chain ratio 0.26 - 1.65  1.15  (H): Data is abnormally high    RADIOGRAPHIC STUDIES: I have personally reviewed the radiological images as listed and agreed with the findings in the report. None to review

## 2024-05-26 NOTE — Patient Instructions (Signed)
 VISIT SUMMARY:  Today, we discussed your chronic kidney disease and anemia. Your hemoglobin level is stable, but you are iron deficient, which may be contributing to your anemia. We ruled out multiple myeloma, and there is no obvious bleeding, but we need to investigate further.  YOUR PLAN:  -ANEMIA DUE TO CKD AND IRON DEFICIENCY: Anemia is a condition where you don't have enough healthy red blood cells to carry oxygen to your body's tissues. Your anemia is due to chronic kidney disease and iron deficiency. We will give you two iron infusions over the next two weeks and start you on oral iron supplements every other day. We will also do a stool test to check for hidden bleeding. Please follow up in six weeks to see how you are responding to the treatment.  -CHRONIC KIDNEY DISEASE: Chronic kidney disease is a long-term condition where the kidneys do not work as well as they should. This is contributing to your anemia. Your nephrologist, Dr. [place], will continue to manage this condition.  INSTRUCTIONS:  You will receive two IV iron infusions next week and the following week. Start taking oral iron supplements every other day as prescribed. Complete the stool test for occult bleeding as ordered. Please schedule a follow-up appointment in six weeks to assess your response to the treatment.

## 2024-06-01 ENCOUNTER — Inpatient Hospital Stay: Attending: Oncology

## 2024-06-01 ENCOUNTER — Encounter: Payer: Self-pay | Admitting: Oncology

## 2024-06-01 VITALS — BP 110/59 | HR 89 | Temp 97.8°F | Resp 18

## 2024-06-01 DIAGNOSIS — D649 Anemia, unspecified: Secondary | ICD-10-CM

## 2024-06-01 DIAGNOSIS — D509 Iron deficiency anemia, unspecified: Secondary | ICD-10-CM | POA: Diagnosis not present

## 2024-06-01 MED ORDER — SODIUM CHLORIDE 0.9 % IV SOLN: INTRAVENOUS | Status: DC

## 2024-06-01 MED ORDER — SODIUM CHLORIDE 0.9 % IV SOLN
510.0000 mg | Freq: Once | INTRAVENOUS | Status: AC
  Administered 2024-06-01: 510 mg via INTRAVENOUS
  Filled 2024-06-01: qty 510

## 2024-06-01 NOTE — Progress Notes (Signed)
 Patient presents today for iron infusion.  Patient is in satisfactory condition with no new complaints voiced.  Vital signs are stable.  IV placed in R hand.  IV flushed well with good blood return noted.  We will proceed with infusion per provider orders.    Patient tolerated infusion well with no complaints voiced.  Patient left via wheelchair with granddaughter in stable condition.  Vital signs stable at discharge.  Follow up as scheduled.

## 2024-06-01 NOTE — Patient Instructions (Signed)
 [facility] - A DEPT OF [facility]  Discharge Instructions: Thank you for choosing [facility] to provide your oncology and hematology care.  If you have a lab appointment with the Cancer Center - please note that after April 8th, 2024, all labs will be drawn in the cancer center.  You do not have to check in or register with the main entrance as you have in the past but will complete your check-in in the cancer center.  Wear comfortable clothing and clothing appropriate for easy access to any Portacath or PICC line.   We strive to give you quality time with your provider. You may need to reschedule your appointment if you arrive late (15 or more minutes).  Arriving late affects you and other patients whose appointments are after yours.  Also, if you miss three or more appointments without notifying the office, you may be dismissed from the clinic at the provider's discretion.      For prescription refill requests, have your pharmacy contact our office and allow 72 hours for refills to be completed.    Today you received the following:  Feraheme.  Ferumoxytol Injection What is this medication? FERUMOXYTOL (FER ue MOX i tol) treats low levels of iron in your body (iron deficiency anemia). Iron is a mineral that plays an important role in making red blood cells, which carry oxygen from your lungs to the rest of your body. This medicine may be used for other purposes; ask your health care provider or pharmacist if you have questions. COMMON BRAND NAME(S): Feraheme What should I tell my care team before I take this medication? They need to know if you have any of these conditions: Anemia not caused by low iron levels High levels of iron in the blood Magnetic resonance imaging (MRI) test scheduled An unusual or allergic reaction to iron, other medications, foods, dyes, or preservatives Pregnant or trying to get pregnant Breastfeeding How should I  use this medication? This medication is injected into a vein. It is given by your care team in a hospital or clinic setting. Talk to your care team the use of this medication in children. Special care may be needed. Overdosage: If you think you have taken too much of this medicine contact a poison control center or emergency room at once. NOTE: This medicine is only for you. Do not share this medicine with others. What if I miss a dose? It is important not to miss your dose. Call your care team if you are unable to keep an appointment. What may interact with this medication? Other iron products This list may not describe all possible interactions. Give your health care provider a list of all the medicines, herbs, non-prescription drugs, or dietary supplements you use. Also tell them if you smoke, drink alcohol, or use illegal drugs. Some items may interact with your medicine. What should I watch for while using this medication? Visit your care team for regular checks on your progress. Tell your care team if your symptoms do not start to get better or if they get worse. You may need blood work done while you are taking this medication. You may need to eat more foods that contain iron. Talk to your care team. Foods that contain iron include whole grains or cereals, dried fruits, beans, peas, leafy green vegetables, and organ meats (liver, kidney). What side effects may I notice from receiving this medication? Side effects that you should  report to your care team as soon as possible: Allergic reactions--skin rash, itching, hives, swelling of the face, lips, tongue, or throat Low blood pressure--dizziness, feeling faint or lightheaded, blurry vision Shortness of breath Side effects that usually do not require medical attention (report to your care team if they continue or are bothersome): Flushing Headache Joint pain Muscle pain Nausea Pain, redness, or irritation at injection site This list  may not describe all possible side effects. Call your doctor for medical advice about side effects. You may report side effects to FDA at 1-800-FDA-1088. Where should I keep my medication? This medication is given in a hospital or clinic. It will not be stored at home. NOTE: This sheet is a summary. It may not cover all possible information. If you have questions about this medicine, talk to your doctor, pharmacist, or health care provider.  2024 Elsevier/Gold Standard (2023-08-05 00:00:00)    To help prevent nausea and vomiting after your treatment, we encourage you to take your nausea medication as directed.  BELOW ARE SYMPTOMS THAT SHOULD BE REPORTED IMMEDIATELY: *FEVER GREATER THAN 100.4 F (38 C) OR HIGHER *CHILLS OR SWEATING *NAUSEA AND VOMITING THAT IS NOT CONTROLLED WITH YOUR NAUSEA MEDICATION *UNUSUAL SHORTNESS OF BREATH *UNUSUAL BRUISING OR BLEEDING *URINARY PROBLEMS (pain or burning when urinating, or frequent urination) *BOWEL PROBLEMS (unusual diarrhea, constipation, pain near the anus) TENDERNESS IN MOUTH AND THROAT WITH OR WITHOUT PRESENCE OF ULCERS (sore throat, sores in mouth, or a toothache) UNUSUAL RASH, SWELLING OR PAIN  UNUSUAL VAGINAL DISCHARGE OR ITCHING   Items with * indicate a potential emergency and should be followed up as soon as possible or go to the Emergency Department if any problems should occur.  Please show the CHEMOTHERAPY ALERT CARD or IMMUNOTHERAPY ALERT CARD at check-in to the Emergency Department and triage nurse.  Should you have questions after your visit or need to cancel or reschedule your appointment, please contact [facility] [place] - A DEPT OF [facility] [phone]  and follow the prompts.  Office hours are 8:00 a.m. to 4:30 p.m. Monday - Friday. Please note that voicemails left after 4:00 p.m. may not be returned until the following business day.  We are closed weekends and major holidays. You have access to a  nurse at all times for urgent questions. Please call the main number to the clinic [phone] and follow the prompts.  For any non-urgent questions, you may also contact your provider using MyChart. We now offer e-Visits for anyone 39 and older to request care online for non-urgent symptoms. For details visit [email or web address].   Also download the MyChart app! Go to the app store, search "MyChart", open the app, select [place], and log in with your MyChart username and password.

## 2024-06-02 ENCOUNTER — Ambulatory Visit: Attending: Nurse Practitioner

## 2024-06-02 DIAGNOSIS — R0609 Other forms of dyspnea: Secondary | ICD-10-CM

## 2024-06-02 LAB — ECHOCARDIOGRAM COMPLETE
AR max vel: 1.37 cm2
AV Area VTI: 1.43 cm2
AV Area mean vel: 1.61 cm2
AV Mean grad: 6 mmHg
AV Peak grad: 10.5 mmHg
Ao pk vel: 1.62 m/s
Area-P 1/2: 3.11 cm2
Calc EF: 65.6 %
MV VTI: 2.23 cm2
S' Lateral: 2.5 cm
Single Plane A2C EF: 65.8 %
Single Plane A4C EF: 64.2 %

## 2024-06-07 ENCOUNTER — Encounter: Payer: Self-pay | Admitting: Oncology

## 2024-06-08 ENCOUNTER — Inpatient Hospital Stay

## 2024-06-08 VITALS — BP 125/82 | HR 85 | Temp 98.4°F | Resp 18 | Wt 171.7 lb

## 2024-06-08 DIAGNOSIS — D649 Anemia, unspecified: Secondary | ICD-10-CM

## 2024-06-08 DIAGNOSIS — D509 Iron deficiency anemia, unspecified: Secondary | ICD-10-CM | POA: Diagnosis not present

## 2024-06-08 MED ORDER — SODIUM CHLORIDE 0.9 % IV SOLN
510.0000 mg | Freq: Once | INTRAVENOUS | Status: AC
  Administered 2024-06-08: 510 mg via INTRAVENOUS
  Filled 2024-06-08: qty 510

## 2024-06-08 MED ORDER — SODIUM CHLORIDE 0.9 % IV SOLN: INTRAVENOUS | Status: DC

## 2024-06-08 NOTE — Patient Instructions (Signed)

## 2024-06-08 NOTE — Progress Notes (Signed)
 Patient presents today for iron infusion.  Patient is in satisfactory condition with no new complaints voiced.  Vital signs are stable.  We will proceed with infusion per provider orders.    Peripheral IV started with good blood return pre and post infusion.  Feraheme 510 mg given today per MD orders. Tolerated infusion without adverse affects. Vital signs stable. No complaints at this time. Discharged from clinic via wheelchair in stable condition. Alert and oriented x 3. F/U with [facility] as scheduled.

## 2024-06-13 DIAGNOSIS — R2681 Unsteadiness on feet: Secondary | ICD-10-CM | POA: Diagnosis not present

## 2024-06-13 DIAGNOSIS — M6281 Muscle weakness (generalized): Secondary | ICD-10-CM | POA: Diagnosis not present

## 2024-06-16 DIAGNOSIS — R2681 Unsteadiness on feet: Secondary | ICD-10-CM | POA: Diagnosis not present

## 2024-06-16 DIAGNOSIS — M6281 Muscle weakness (generalized): Secondary | ICD-10-CM | POA: Diagnosis not present

## 2024-06-23 DIAGNOSIS — M6281 Muscle weakness (generalized): Secondary | ICD-10-CM | POA: Diagnosis not present

## 2024-06-23 DIAGNOSIS — R2681 Unsteadiness on feet: Secondary | ICD-10-CM | POA: Diagnosis not present

## 2024-06-25 ENCOUNTER — Ambulatory Visit: Payer: Self-pay | Admitting: Nurse Practitioner

## 2024-07-05 DIAGNOSIS — M6281 Muscle weakness (generalized): Secondary | ICD-10-CM | POA: Diagnosis not present

## 2024-07-05 DIAGNOSIS — R2681 Unsteadiness on feet: Secondary | ICD-10-CM | POA: Diagnosis not present

## 2024-07-07 DIAGNOSIS — R2681 Unsteadiness on feet: Secondary | ICD-10-CM | POA: Diagnosis not present

## 2024-07-07 DIAGNOSIS — M6281 Muscle weakness (generalized): Secondary | ICD-10-CM | POA: Diagnosis not present

## 2024-07-12 DIAGNOSIS — M6281 Muscle weakness (generalized): Secondary | ICD-10-CM | POA: Diagnosis not present

## 2024-07-12 DIAGNOSIS — R2681 Unsteadiness on feet: Secondary | ICD-10-CM | POA: Diagnosis not present

## 2024-07-18 DIAGNOSIS — M6281 Muscle weakness (generalized): Secondary | ICD-10-CM | POA: Diagnosis not present

## 2024-07-18 DIAGNOSIS — R2681 Unsteadiness on feet: Secondary | ICD-10-CM | POA: Diagnosis not present

## 2024-07-20 ENCOUNTER — Ambulatory Visit: Payer: Medicare Other | Admitting: "Endocrinology

## 2024-07-20 ENCOUNTER — Inpatient Hospital Stay

## 2024-07-21 ENCOUNTER — Ambulatory Visit: Attending: Nurse Practitioner | Admitting: Nurse Practitioner

## 2024-07-21 ENCOUNTER — Encounter: Payer: Self-pay | Admitting: Nurse Practitioner

## 2024-07-21 NOTE — Progress Notes (Deleted)
 Cardiology Office Note:  .   Date: 05/13/2024 ID:  Matthew Harrell, DOB Sep 15, 1941, MRN [id number] PCP: Trudy Vaughn FALCON, MD  [facility] Providers Cardiologist:  Diannah SHAUNNA Maywood, MD    History of Present Illness: .   Matthew Harrell is a 83 y.o. male with a PMH of hypertension, type 2 diabetes, OSA (not on CPAP), history of CVA, who presents today for scheduled follow-up.  Previously evaluated in March 2020 for her evaluation of syncope.  Noted feeling dizzy when getting up from a sitting position, noticed some dizziness with exertion.  Patient had stated he had lunch and was walking to the bathroom a few weeks prior to office visit when he felt dizzy and passed out on the bed.  Patient stated happened a couple times.  Was following neurology.  Reported mainly being sedentary.  Denied any chest pain, leg swelling, or palpitations.  Was felt that his symptoms could be likely secondary to autonomic dysfunction from diabetes.  2-week event monitor was overall benign-see report below.  Today he presents for intermittent shortness of breath evaluation. He presents with his daughter.  Patient lives with daughter who works from home and she has noticed that he gets short of breath when going to the bathroom over the past 6 months.  Notices intermittent shortness of breath.  Notices that he sleeps a lot.  Gives out also when showering. Denies any chest pain, palpitations, syncope, presyncope, dizziness, orthopnea, PND, swelling or significant weight changes, acute bleeding, or claudication.  PCP wanted him to be evaluated and daughter also ask about if our office received EKG as there was a recent abnormal finding.  I reviewed the chart and have not seen this EKG fax her office at this time.  ROS: Negative.  See HPI.  Studies Reviewed: SABRA    EKG: EKG is not ordered today.  I will request EKG from PCP's office.   Cardiac monitor 05/2023:   Predominantly normal sinus rhythm ranging from 59  to 158 bpm with average HR 85 bpm   22 runs of SVT, fastest lasting 2.5 seconds and longest lasting 12.8 seconds   No atrial or ventricular arrhythmias   No AV block or pauses   <1% PAC and <!% PVC burden   No patient triggered events  Physical Exam:   VS:  There were no vitals taken for this visit.   Wt Readings from Last 3 Encounters:  06/08/24 171 lb 11.2 oz (77.9 kg)  05/26/24 173 lb 8 oz (78.7 kg)  05/13/24 171 lb 12.8 oz (77.9 kg)    GEN: Well nourished, well developed in no acute distress NECK: No JVD; No carotid bruits CARDIAC: S1/S2, RRR, no murmurs, rubs, gallops RESPIRATORY:  Clear to auscultation without rales, wheezing or rhonchi  ABDOMEN: Soft, non-tender, non-distended EXTREMITIES:  No edema; No deformity   ASSESSMENT AND PLAN: .    HTN BP stable and at goal. Discussed to monitor BP at home at least 2 hours after medications and sitting for 5-10 minutes.  No medication changes at this time.  Low-salt, heart healthy diet recommended  DOE Etiology unclear.  Will arrange echocardiogram for further evaluation of his symptoms.  Will also obtain proBNP.  Continue current medication regimen.  Continue follow-up with PCP.  Care and ED precautions discussed.  OSA Encouraged continued compliance of CPAP machine.  He verbalized understanding.  Hx of CVA Denies any recent strokelike symptoms.  Continue current medication regimen.  Continue follow-up with PCP.   Dispo:  Follow-up with MD/APP in 6 weeks or sooner anything changes.  Signed, Almarie Crate, NP

## 2024-07-22 ENCOUNTER — Encounter: Payer: Self-pay | Admitting: Nurse Practitioner

## 2024-07-27 ENCOUNTER — Inpatient Hospital Stay

## 2024-07-27 ENCOUNTER — Ambulatory Visit: Admitting: "Endocrinology

## 2024-07-28 ENCOUNTER — Inpatient Hospital Stay: Admitting: Oncology

## 2024-08-09 ENCOUNTER — Ambulatory Visit: Admitting: "Endocrinology

## 2024-08-18 ENCOUNTER — Other Ambulatory Visit: Payer: Self-pay | Admitting: "Endocrinology

## 2024-08-26 ENCOUNTER — Other Ambulatory Visit: Payer: Self-pay | Admitting: "Endocrinology

## 2024-09-19 ENCOUNTER — Other Ambulatory Visit: Payer: Self-pay | Admitting: "Endocrinology

## 2024-10-18 ENCOUNTER — Other Ambulatory Visit: Payer: Self-pay | Admitting: "Endocrinology

## 2024-10-18 DIAGNOSIS — E1122 Type 2 diabetes mellitus with diabetic chronic kidney disease: Secondary | ICD-10-CM

## 2024-11-16 ENCOUNTER — Other Ambulatory Visit: Payer: Self-pay | Admitting: "Endocrinology

## 2024-11-19 LAB — LAB REPORT - SCANNED
Free T4: 1.14 ng/dL
TSH: 2.69 (ref 0.41–5.90)

## 2024-11-21 LAB — LAB REPORT - SCANNED: EGFR: 35.4

## 2024-11-23 ENCOUNTER — Encounter: Payer: Self-pay | Admitting: "Endocrinology

## 2024-11-23 ENCOUNTER — Ambulatory Visit: Admitting: "Endocrinology

## 2024-11-23 VITALS — BP 100/68 | HR 88 | Ht 63.0 in | Wt 165.4 lb

## 2024-11-23 DIAGNOSIS — N1832 Chronic kidney disease, stage 3b: Secondary | ICD-10-CM

## 2024-11-23 DIAGNOSIS — Z794 Long term (current) use of insulin: Secondary | ICD-10-CM

## 2024-11-23 DIAGNOSIS — E782 Mixed hyperlipidemia: Secondary | ICD-10-CM

## 2024-11-23 DIAGNOSIS — E1122 Type 2 diabetes mellitus with diabetic chronic kidney disease: Secondary | ICD-10-CM | POA: Diagnosis not present

## 2024-11-23 DIAGNOSIS — I10 Essential (primary) hypertension: Secondary | ICD-10-CM | POA: Diagnosis not present

## 2024-11-23 DIAGNOSIS — E039 Hypothyroidism, unspecified: Secondary | ICD-10-CM

## 2024-11-23 LAB — POCT GLYCOSYLATED HEMOGLOBIN (HGB A1C): HbA1c, POC (controlled diabetic range): 9 % — AB (ref 0.0–7.0)

## 2024-11-23 MED ORDER — LEVOTHYROXINE SODIUM 25 MCG PO TABS: 25.0000 ug | ORAL_TABLET | Freq: Every day | ORAL | 1 refills | Status: AC

## 2024-11-23 MED ORDER — ACCU-CHEK GUIDE TEST VI STRP: ORAL_STRIP | 3 refills | Status: AC

## 2024-11-23 MED ORDER — COMFORT EZ PEN NEEDLES 32G X 4 MM MISC: 0 refills | Status: DC

## 2024-11-23 NOTE — Progress Notes (Signed)
 11/23/2024, 5:14 PM  Endocrinology follow-up note   Subjective:    Patient ID: Matthew Harrell, male    DOB: August 25, 1941.  Matthew Harrell is being seen in follow-up after he was seen in consultation for management of currently uncontrolled symptomatic diabetes requested by  Matthew Vaughn FALCON, MD. He is accompanied by his daughter Matthew Harrell to clinic today.  Matthew Harrell is offering to  help.  Past Medical History:  Diagnosis Date   Arthritis    Diabetes (HCC)    Diabetes mellitus, type II (HCC)    Hypertension    Kidney disease    Stroke (cerebrum) (HCC)     Past Surgical History:  Procedure Laterality Date   NASAL SINUS SURGERY     TOTAL HIP ARTHROPLASTY     Left    Social History   Socioeconomic History   Marital status: Divorced    Spouse name: Not on file   Number of children: 3   Years of education: Not on file   Highest education level: Not on file  Occupational History   Occupation: retired  Tobacco Use   Smoking status: Former    Current packs/day: 0.00    Average packs/day: 1 pack/day for 10.0 years (10.0 ttl pk-yrs)    Types: Cigarettes    Start date: 12/29/1972    Quit date: 12/29/1982    Years since quitting: 41.9   Smokeless tobacco: Never  Substance and Sexual Activity   Alcohol use: No   Drug use: No   Sexual activity: Not on file  Other Topics Concern   Not on file  Social History Narrative   Lives alone.  Retired designer, fashion/clothing.     Social Drivers of Corporate Investment Banker Strain: Not on file  Food Insecurity: Not on file  Transportation Needs: Not on file  Physical Activity: Not on file  Stress: Not on file  Social Connections: Unknown (05/13/2022)   Received from [facility]   Social Network    Social Network: Not on file    Family History  Problem Relation Age of Onset   Diabetes Brother    Stomach cancer Sister    Aneurysm Mother 45   Heart attack Father 49    Outpatient Encounter Medications as of  11/23/2024  Medication Sig   glucose blood (ACCU-CHEK GUIDE TEST) test strip Use to monitor glucose 2 times daily as instructed   Accu-Chek Softclix Lancets lancets USE TO TEST BLOOD SUGAR 4 TIMES DAILY   Acetaminophen  500 MG capsule Take by mouth as needed for fever.   albuterol (PROVENTIL HFA;VENTOLIN HFA) 108 (90 Base) MCG/ACT inhaler Inhale 1-2 puffs into the lungs every 6 (six) hours as needed for wheezing or shortness of breath.   amLODipine  (NORVASC ) 2.5 MG tablet Take 2.5 mg by mouth daily.   atorvastatin (LIPITOR) 40 MG tablet Take 1 tablet by mouth daily.   Blood Glucose Monitoring Suppl (ACCU-CHEK GUIDE) w/Device KIT Use to test BG qid. Dx E11.65   clopidogrel (PLAVIX) 75 MG tablet Take 1 tablet by mouth daily.   cyanocobalamin  1000 MCG tablet Take 1 tablet by mouth daily.   diclofenac sodium (VOLTAREN) 1 % GEL Apply 2 g topically 4 (four) times daily as needed (pain).    DULoxetine (CYMBALTA) 30 MG capsule Take 30 mg by mouth 2 (two) times daily.   dutasteride (AVODART) 0.5 MG capsule Take 0.5 mg by mouth every morning.   ferrous sulfate  325 (65 FE) MG EC tablet  Take 1 tablet (325 mg total) by mouth every other day.   fluticasone (FLONASE) 50 MCG/ACT nasal spray Place 2 sprays into both nostrils daily.   glipiZIDE  (GLUCOTROL  XL) 5 MG 24 hr tablet TAKE 1 TABLET BY MOUTH EVERY MORNING   insulin degludec  (TRESIBA  FLEXTOUCH) 100 UNIT/ML FlexTouch Pen INJECT 30 UNITS SUBCUTANEOUSLY AT BEDTIME   Insulin Pen Needle (COMFORT EZ PEN NEEDLES) 32G X 4 MM MISC USE TO INJECT INSULIN DAILY AS DIRECTED *REFILL REQUEST*   levothyroxine  (SYNTHROID ) 25 MCG tablet Take 1 tablet (25 mcg total) by mouth daily before breakfast.   omeprazole  (PRILOSEC) 20 MG capsule Take 1 capsule (20 mg total) by mouth daily.   sodium bicarbonate 650 MG tablet Take 650 mg by mouth 3 (three) times daily.   tamsulosin (FLOMAX) 0.4 MG CAPS capsule Take 0.4 mg by mouth every morning.   VELTASSA 8.4 g packet Take 1 packet by  mouth daily.   [DISCONTINUED] ACCU-CHEK GUIDE test strip USE TO TEST BLOOD SUGAR TWICE DAILY   [DISCONTINUED] Insulin Pen Needle (B-D ULTRAFINE III SHORT PEN) 31G X 8 MM MISC 1 each by Does not apply route as directed.   [DISCONTINUED] Insulin Pen Needle (COMFORT EZ PEN NEEDLES) 32G X 4 MM MISC USE TO INJECT INSULIN DAILY AS DIRECTED *REFILL REQUEST*   [DISCONTINUED] levothyroxine  (SYNTHROID ) 25 MCG tablet TAKE 1 TABLET BY MOUTH DAILY BEFORE BREAKFAST   No facility-administered encounter medications on file as of 11/23/2024.    ALLERGIES: No Known Allergies  VACCINATION STATUS: Immunization History  Administered Date(s) Administered   Influenza Split 09/28/2013   Influenza,inj,Quad PF,6+ Mos 10/02/2017   Influenza-Unspecified 10/03/2016   Pneumococcal Conjugate-13 10/11/2014   Pneumococcal Polysaccharide-23 12/30/2007, 11/11/2012    Diabetes He presents for his follow-up diabetic visit. He has type 2 diabetes mellitus. Onset time: He was diagnosed at approximate age of 40 years. His disease course has been fluctuating. There are no hypoglycemic associated symptoms. Pertinent negatives for hypoglycemia include no confusion, headaches, pallor or seizures. Pertinent negatives for diabetes include no chest pain, no fatigue, no polydipsia, no polyphagia, no polyuria, no weakness and no weight loss. There are no hypoglycemic complications. Symptoms are improving. Diabetic complications include a CVA and nephropathy. Risk factors for coronary artery disease include diabetes mellitus, dyslipidemia, family history, male sex, hypertension, sedentary lifestyle and tobacco exposure. Current diabetic treatments: He is currently on Ozempic 0.5 mg subcutaneous weekly.  He was recently taken off of Jardiance and metformin  due to CKD. His weight is decreasing steadily. He is following a generally unhealthy diet. When asked about meal planning, he reported none. He participates in exercise intermittently. His  home blood glucose trend is fluctuating minimally. His breakfast blood glucose range is generally 130-140 mg/dl. His bedtime blood glucose range is generally 140-180 mg/dl. His overall blood glucose range is 140-180 mg/dl. (He presents with his meter showing average blood glucose of 131-153 fasting, 171 postprandial.  His point-of-care A1c is 9%, increasing from 8.7% during his last visit.  He did not document any hypoglycemia. )  Hyperlipidemia This is a chronic problem. The current episode started more than 1 year ago. Pertinent negatives include no chest pain, myalgias or shortness of breath. Current antihyperlipidemic treatment includes statins. Risk factors for coronary artery disease include dyslipidemia, diabetes mellitus, hypertension and male sex.  Hypertension This is a chronic problem. The current episode started more than 1 year ago. The problem is controlled. Pertinent negatives include no chest pain, headaches, neck pain, palpitations or shortness of breath.  Risk factors for coronary artery disease include dyslipidemia, diabetes mellitus, male gender, sedentary lifestyle and smoking/tobacco exposure. Hypertensive end-organ damage includes CVA.     Objective:       11/23/2024    4:03 PM 06/08/2024    3:20 PM 06/08/2024    1:53 PM  Vitals with BMI  Height 5' 3    Weight 165 lbs 6 oz  171 lbs 11 oz  BMI 29.31  30.42  Systolic 100 125 858  Diastolic 68 82 58  Pulse 88 85 87    BP 100/68   Pulse 88   Ht 5' 3 (1.6 m)   Wt 165 lb 6.4 oz (75 kg)   BMI 29.30 kg/m   Wt Readings from Last 3 Encounters:  11/23/24 165 lb 6.4 oz (75 kg)  06/08/24 171 lb 11.2 oz (77.9 kg)  05/26/24 173 lb 8 oz (78.7 kg)       CMP ( most recent) CMP     Component Value Date/Time   NA 142 07/21/2022 0000   K 5.3 (A) 07/21/2022 0000   CL 104 07/21/2022 0000   CO2 23 (A) 07/21/2022 0000   GLUCOSE 102 (H) 08/27/2018 0418   BUN 36 (A) 07/21/2022 0000   CREATININE 2.1 (A) 07/21/2022 0000    CREATININE 1.55 (H) 08/27/2018 0418   CALCIUM 9.0 07/21/2022 0000   PROT 8.2 (H) 08/23/2018 2025   ALBUMIN 4.4 07/21/2022 0000   AST 19 07/21/2022 0000   ALT 7 (A) 07/21/2022 0000   ALKPHOS 227 (A) 07/21/2022 0000   BILITOT 0.9 08/23/2018 2025   GFRNONAA 28 08/23/2021 0000   GFRAA 48 (L) 08/27/2018 0418     Diabetic Labs (most recent): Lab Results  Component Value Date   HGBA1C 9.0 (A) 11/23/2024   HGBA1C 8.7 (A) 01/18/2024   HGBA1C 7.9 (A) 10/20/2022     Assessment & Plan:   1. Type 2 diabetes mellitus with stage 3b chronic kidney disease, without long-term current use of insulin (HCC)  - Matthew Harrell has currently uncontrolled symptomatic type 2 DM since  83 years of age.  He presents with his meter showing average blood glucose of 131-153 fasting, 171 postprandial.  His point-of-care A1c is 9%, increasing from 8.7% during his last visit.  He did not document any hypoglycemia.   Recent labs reviewed. - I had a long discussion with him about the progressive nature of diabetes and the pathology behind its complications. -his diabetes is complicated by CVA, nephropathy, and history of heavy alcohol use/abuse and he remains at a high risk for more acute and chronic complications which include CAD, CVA, CKD, retinopathy, and neuropathy. These are all discussed in detail with him.  - I have counseled him on diet  and weight management  by adopting a carbohydrate restricted/protein rich diet. Patient is encouraged to switch to  unprocessed or minimally processed     complex starch and increased protein intake (animal or plant source), fruits, and vegetables. -  he is advised to stick to a routine mealtimes to eat 3 meals  a day and avoid unnecessary snacks ( to snack only to correct hypoglycemia).   - he acknowledges that there is a room for improvement in his food and drink choices. - Suggestion is made for him to avoid simple carbohydrates  from his diet including Cakes, Sweet  Desserts, Ice Cream, Soda (diet and regular), Sweet Tea, Candies, Chips, Cookies, Store Bought Juices, Alcohol in Excess of  1-2 drinks a day,  Artificial Sweeteners,  Coffee Creamer, and Sugar-free Products, Lemonade. This will help patient to have more stable blood glucose profile and potentially avoid unintended weight gain.  - he will be scheduled with Penny Crumpton, RDN, CDE for diabetes education.  - I have approached him with the following individualized plan to manage  his diabetes and patient agrees:   - There has been a trend of discordance between his A1c and average blood glucose.  For safety reasons, tightening of glycemia will be avoided in this patient.  Priority will be to avoid hypoglycemia.    He is advised to continue Tresiba  30 units nightly,  associated with monitoring of blood glucose twice daily-before breakfast and at bedtime.   - he is encouraged to call clinic for blood glucose levels less than 70 or above 200 mg /dl x3 in a week fasting.  -He is not a candidate for metformin .  He will continue to benefit from low-dose glipizide -5 mg XL p.o. daily at breakfast.   -Due to unintended weight loss, he was taken off of incretin therapy, as well as SGLT2 inhibitors.  - Specific targets for  A1c;  LDL, HDL,  and Triglycerides were discussed with the patient.  2) Blood Pressure /Hypertension:  - His blood pressure is controlled to target.  he is advised to continue his current medications including amlodipine  5 mg p.o. daily.  He will be considered for low-dose ACE inhibitor or ARB if no contraindications from nephrology point of view.   3) Lipids/Hyperlipidemia: His previsit labs show LDL at 102.  He is advised to continue atorvastatin 20 mg p.o. nightly.  side effects and precautions discussed with him.    4)  Weight/Diet:  Body mass index is 29.3 kg/m.  -  he is not a candidate for major weight loss. I discussed with him the fact that loss of 5 - 10% of his  current  body weight will have the most impact on his diabetes management.  Exercise, and detailed carbohydrates information provided  -  detailed on discharge instructions. 5) hypothyroidism His previsit labs show continued abnormal TSH above 5.4 with low normal free T4.  He will benefit from low-dose levothyroxine .  I discussed and prescribed levothyroxine  25 mcg p.o. daily before breakfast.    6) Chronic Care/Health Maintenance:  -he  is on Statin medications and  is encouraged to initiate and continue to follow up with Ophthalmology, Dentist,  Podiatrist at least yearly or according to recommendations, and advised to   stay away from smoking. I have recommended yearly flu vaccine and pneumonia vaccine at least every 5 years; moderate intensity exercise for up to 150 minutes weekly; and  sleep for at least 7 hours a day.  - he is  advised to maintain close follow up with Matthew Vaughn FALCON, MD for primary care needs, as well as his other providers for optimal and coordinated care.  I spent  26  minutes in the care of the patient today including review of labs from CMP, Lipids, Thyroid  Function, Hematology (current and previous including abstractions from other facilities); face-to-face time discussing  his blood glucose readings/logs, discussing hypoglycemia and hyperglycemia episodes and symptoms, medications doses, his options of short and long term treatment based on the latest standards of care / guidelines;  discussion about incorporating lifestyle medicine;  and documenting the encounter. Risk reduction counseling performed per USPSTF guidelines to reduce  cardiovascular risk factors.     Please refer to Patient Instructions for Blood Glucose Monitoring  and Insulin/Medications Dosing Guide  in media tab for additional information. Please  also refer to  Patient Self Inventory in the Media  tab for reviewed elements of pertinent patient history.  Matthew Harrell participated in the discussions,  expressed understanding, and voiced agreement with the above plans.  All questions were answered to his satisfaction. he is encouraged to contact clinic should he have any questions or concerns prior to his return visit.    Follow up plan: - Return in about 6 months (around 05/23/2025) for Bring Meter/CGM Device/Logs- A1c in Office.  Matthew Earl, MD [facility] [address] [place], [place] [address] Phone: [phone]  Fax: [phone]    11/23/2024, 5:14 PM  This note was partially dictated with voice recognition software. Similar sounding words can be transcribed inadequately or may not  be corrected upon review.

## 2024-11-23 NOTE — Patient Instructions (Signed)

## 2024-12-06 ENCOUNTER — Other Ambulatory Visit: Payer: Self-pay | Admitting: "Endocrinology

## 2024-12-06 DIAGNOSIS — E1122 Type 2 diabetes mellitus with diabetic chronic kidney disease: Secondary | ICD-10-CM

## 2024-12-07 ENCOUNTER — Other Ambulatory Visit: Payer: Self-pay | Admitting: "Endocrinology

## 2024-12-07 DIAGNOSIS — N1832 Chronic kidney disease, stage 3b: Secondary | ICD-10-CM

## 2024-12-14 ENCOUNTER — Telehealth: Payer: Self-pay | Admitting: "Endocrinology

## 2024-12-14 MED ORDER — GLIPIZIDE ER 5 MG PO TB24: 5.0000 mg | ORAL_TABLET | Freq: Every morning | ORAL | 1 refills | Status: AC

## 2024-12-14 NOTE — Telephone Encounter (Signed)
 Patient's daughter called and said his glipizide  was denied but patient was here on 11/26. Can you please send that in so it can be in his bubble pack. [facility]

## 2024-12-14 NOTE — Telephone Encounter (Signed)
 A 90 day supply of Glipizide  was sent to pharmacy on file.

## 2025-01-05 ENCOUNTER — Inpatient Hospital Stay

## 2025-01-06 ENCOUNTER — Inpatient Hospital Stay: Attending: Oncology

## 2025-01-06 DIAGNOSIS — D649 Anemia, unspecified: Secondary | ICD-10-CM

## 2025-01-06 DIAGNOSIS — D509 Iron deficiency anemia, unspecified: Secondary | ICD-10-CM | POA: Diagnosis not present

## 2025-01-06 DIAGNOSIS — N189 Chronic kidney disease, unspecified: Secondary | ICD-10-CM | POA: Diagnosis present

## 2025-01-06 DIAGNOSIS — D631 Anemia in chronic kidney disease: Secondary | ICD-10-CM | POA: Diagnosis present

## 2025-01-06 LAB — COMPREHENSIVE METABOLIC PANEL WITH GFR
ALT: 6 U/L (ref 0–44)
AST: 23 U/L (ref 15–41)
Albumin: 4.2 g/dL (ref 3.5–5.0)
Alkaline Phosphatase: 134 U/L — ABNORMAL HIGH (ref 38–126)
Anion gap: 16 — ABNORMAL HIGH (ref 5–15)
BUN: 36 mg/dL — ABNORMAL HIGH (ref 8–23)
CO2: 24 mmol/L (ref 22–32)
Calcium: 8.5 mg/dL — ABNORMAL LOW (ref 8.9–10.3)
Chloride: 102 mmol/L (ref 98–111)
Creatinine, Ser: 1.94 mg/dL — ABNORMAL HIGH (ref 0.61–1.24)
GFR, Estimated: 34 mL/min — ABNORMAL LOW
Glucose, Bld: 312 mg/dL — ABNORMAL HIGH (ref 70–99)
Potassium: 3.9 mmol/L (ref 3.5–5.1)
Sodium: 142 mmol/L (ref 135–145)
Total Bilirubin: 0.8 mg/dL (ref 0.0–1.2)
Total Protein: 6.9 g/dL (ref 6.5–8.1)

## 2025-01-06 LAB — CBC WITH DIFFERENTIAL/PLATELET
Abs Immature Granulocytes: 0.04 K/uL (ref 0.00–0.07)
Basophils Absolute: 0 K/uL (ref 0.0–0.1)
Basophils Relative: 1 %
Eosinophils Absolute: 0.2 K/uL (ref 0.0–0.5)
Eosinophils Relative: 2 %
HCT: 35.1 % — ABNORMAL LOW (ref 39.0–52.0)
Hemoglobin: 11.4 g/dL — ABNORMAL LOW (ref 13.0–17.0)
Immature Granulocytes: 1 %
Lymphocytes Relative: 19 %
Lymphs Abs: 1.5 K/uL (ref 0.7–4.0)
MCH: 32.4 pg (ref 26.0–34.0)
MCHC: 32.5 g/dL (ref 30.0–36.0)
MCV: 99.7 fL (ref 80.0–100.0)
Monocytes Absolute: 0.7 K/uL (ref 0.1–1.0)
Monocytes Relative: 8 %
Neutro Abs: 5.5 K/uL (ref 1.7–7.7)
Neutrophils Relative %: 69 %
Platelets: 198 K/uL (ref 150–400)
RBC: 3.52 MIL/uL — ABNORMAL LOW (ref 4.22–5.81)
RDW: 12.1 % (ref 11.5–15.5)
WBC: 7.9 K/uL (ref 4.0–10.5)
nRBC: 0 % (ref 0.0–0.2)

## 2025-01-06 LAB — FOLATE: Folate: 15.5 ng/mL

## 2025-01-06 LAB — IRON AND TIBC
Iron: 98 ug/dL (ref 45–182)
Saturation Ratios: 34 % (ref 17.9–39.5)
TIBC: 287 ug/dL (ref 250–450)
UIBC: 189 ug/dL

## 2025-01-06 LAB — FERRITIN: Ferritin: 464 ng/mL — ABNORMAL HIGH (ref 24–336)

## 2025-01-06 LAB — VITAMIN B12: Vitamin B-12: 445 pg/mL (ref 180–914)

## 2025-01-11 NOTE — Progress Notes (Signed)
 "  [facility]  HEMATOLOGY FOLLOW-UP VISIT  Matthew Vaughn FALCON, MD  REASON FOR FOLLOW-UP: Normocytic anemia  ASSESSMENT & PLAN:  Patient is a 84 y.o. male following for normocytic anemia  Anemia Anemia secondary to CKD and iron deficiency. Hemoglobin 11.4 g/dL. Improved with IV iron Multiple myeloma ruled out-SPEP: No M spike, IFE normal.  Slight elevation of free light chains with normal ratio No overt GI bleeding, occult bleeding investigation needed.  Patient never had a colonoscopy/endoscopy.   - Will hold off on further IV iron as ferritin is above 400 - Continue oral iron every other day. -Patient does not meet criteria for ESA at this time.    Follow up in six  months with labs   Orders Placed This Encounter  Procedures   CBC with Differential    Standing Status:   Future    Expected Date:   07/10/2025    Expiration Date:   10/08/2025   Comprehensive metabolic panel    Standing Status:   Future    Expected Date:   07/10/2025    Expiration Date:   10/08/2025   Iron and TIBC ([facility] ONLY)    Standing Status:   Future    Expected Date:   07/10/2025    Expiration Date:   10/08/2025   Ferritin    Standing Status:   Future    Expected Date:   07/10/2025    Expiration Date:   10/08/2025   Vitamin B12    Standing Status:   Future    Expected Date:   07/10/2025    Expiration Date:   10/08/2025   Folate    Standing Status:   Future    Expected Date:   07/10/2025    Expiration Date:   10/08/2025    The total time spent in the appointment was 13 minutes encounter with patients including review of chart and various tests results, discussions about plan of care and coordination of care plan   All questions were answered. The patient knows to call the clinic with any problems, questions or concerns. No barriers to learning was detected.   Matthew Harrell,acting as a neurosurgeon for Matthew Dry, MD.,have documented  all relevant documentation on the behalf of Matthew Dry, MD,as directed by  Matthew Dry, MD while in the presence of Matthew Dry, MD.  I, Matthew Dry MD, have reviewed the above documentation for accuracy and completeness, and I agree with the above.    Matthew Dry, MD 1/15/20263:58 PM   SUMMARY OF HEMATOLOGIC HISTORY: Iron deficiency anemia along with anemia of chronic kidney disease - Patient never had endoscopy colonoscopy -Multiple myeloma ruled out-SPEP: No M spike, IFE normal.  Slight elevation of free light chains with normal ratio -IV feraheme 510mg  x 2 doses on 06/01/2024 and o6/10/2024   INTERVAL HISTORY: Matthew Harrell 84 y.o. male following for normocytic anemia. Patient was accompanied by daughter today.   He is doing well overall. He denies any abdominal pain, unintentional weight loss, loss of appetite, melena, or hematochezia. His anemia has slightly improved since being given IV iron. He is taking iron supplements every other day.   I have reviewed the past medical history, past surgical history, social history and family history with the patient   ALLERGIES:  has no known allergies.  MEDICATIONS:  Current Outpatient Medications  Medication Sig Dispense Refill   Cholecalciferol (VITAMIN D) 50 MCG (2000 UT) tablet Take 4,000 Units by mouth at  bedtime.     Accu-Chek Softclix Lancets lancets USE TO TEST BLOOD SUGAR 4 TIMES DAILY 200 each 11   Acetaminophen  500 MG capsule Take by mouth as needed for fever.     albuterol (PROVENTIL HFA;VENTOLIN HFA) 108 (90 Base) MCG/ACT inhaler Inhale 1-2 puffs into the lungs every 6 (six) hours as needed for wheezing or shortness of breath.     amLODipine  (NORVASC ) 2.5 MG tablet Take 2.5 mg by mouth daily.     atorvastatin (LIPITOR) 40 MG tablet Take 1 tablet by mouth daily.     Blood Glucose Monitoring Suppl (ACCU-CHEK GUIDE) w/Device KIT Use to test BG qid. Dx E11.65 1 kit 0   clopidogrel (PLAVIX) 75 MG tablet  Take 1 tablet by mouth daily.     cyanocobalamin  1000 MCG tablet Take 1 tablet by mouth daily.     diclofenac sodium (VOLTAREN) 1 % GEL Apply 2 g topically 4 (four) times daily as needed (pain).      DULoxetine (CYMBALTA) 30 MG capsule Take 30 mg by mouth 2 (two) times daily.     dutasteride (AVODART) 0.5 MG capsule Take 0.5 mg by mouth every morning.     ferrous sulfate  325 (65 FE) MG EC tablet Take 1 tablet (325 mg total) by mouth every other day. 45 tablet 3   fluticasone (FLONASE) 50 MCG/ACT nasal spray Place 2 sprays into both nostrils daily.     glipiZIDE  (GLUCOTROL  XL) 5 MG 24 hr tablet Take 1 tablet (5 mg total) by mouth every morning. 90 tablet 1   glucose blood (ACCU-CHEK GUIDE TEST) test strip Use to monitor glucose 2 times daily as instructed 100 each 3   insulin degludec  (TRESIBA  FLEXTOUCH) 100 UNIT/ML FlexTouch Pen INJECT 30 UNITS SUBCUTANEOUSLY AT BEDTIME 15 mL 10   Insulin Pen Needle (COMFORT EZ PEN NEEDLES) 32G X 4 MM MISC USE TO INJECT INSULIN DAILY AS DIRECTED 100 each 11   levothyroxine  (SYNTHROID ) 25 MCG tablet Take 1 tablet (25 mcg total) by mouth daily before breakfast. 90 tablet 1   omeprazole  (PRILOSEC) 20 MG capsule Take 1 capsule (20 mg total) by mouth daily.     sodium bicarbonate 650 MG tablet Take 650 mg by mouth 3 (three) times daily.     tamsulosin (FLOMAX) 0.4 MG CAPS capsule Take 0.4 mg by mouth every morning.     VELTASSA 8.4 g packet Take 1 packet by mouth daily.     No current facility-administered medications for this visit.     PHYSICAL EXAMINATION:  Vitals:   01/12/25 1457  BP: 128/80  Pulse: 94  Resp: 18  Temp: 97.8 F (36.6 C)  SpO2: 96%    GENERAL:alert, no distress and comfortable SKIN: skin color, texture, turgor are normal, no rashes or significant lesions LUNGS: clear to auscultation and percussion with normal breathing effort HEART: regular rate & rhythm and no murmurs and no lower extremity edema ABDOMEN:abdomen soft, non-tender and  normal bowel sounds Musculoskeletal:no cyanosis of digits and no clubbing  NEURO: alert & oriented x 3 with fluent speech  LABORATORY DATA:  I have reviewed the data as listed  Lab Results  Component Value Date   WBC 7.9 01/06/2025   NEUTROABS 5.5 01/06/2025   HGB 11.4 (L) 01/06/2025   HCT 35.1 (L) 01/06/2025   MCV 99.7 01/06/2025   PLT 198 01/06/2025       Chemistry      Component Value Date/Time   NA 142 01/06/2025 1251   NA 142  07/21/2022 0000   K 3.9 01/06/2025 1251   CL 102 01/06/2025 1251   CO2 24 01/06/2025 1251   BUN 36 (H) 01/06/2025 1251   BUN 36 (A) 07/21/2022 0000   CREATININE 1.94 (H) 01/06/2025 1251   GLU 232 07/21/2022 0000      Component Value Date/Time   CALCIUM 8.5 (L) 01/06/2025 1251   ALKPHOS 134 (H) 01/06/2025 1251   AST 23 01/06/2025 1251   ALT 6 01/06/2025 1251   BILITOT 0.8 01/06/2025 1251      Latest Reference Range & Units 01/06/25 12:51  Iron 45 - 182 ug/dL 98  UIBC ug/dL 810  TIBC 749 - 549 ug/dL 712  Saturation Ratios 17.9 - 39.5 % 34  Ferritin 24 - 336 ng/mL 464 (H)  Folate >5.9 ng/mL 15.5  Vitamin B12 180 - 914 pg/mL 445  (H): Data is abnormally high   RADIOGRAPHIC STUDIES: I have personally reviewed the radiological images as listed and agreed with the findings in the report.  None to review    "

## 2025-01-12 ENCOUNTER — Inpatient Hospital Stay: Admitting: Oncology

## 2025-01-12 ENCOUNTER — Inpatient Hospital Stay

## 2025-01-12 VITALS — BP 128/80 | HR 94 | Temp 97.8°F | Resp 18

## 2025-01-12 DIAGNOSIS — D649 Anemia, unspecified: Secondary | ICD-10-CM | POA: Diagnosis not present

## 2025-01-12 DIAGNOSIS — N189 Chronic kidney disease, unspecified: Secondary | ICD-10-CM | POA: Diagnosis not present

## 2025-01-12 DIAGNOSIS — N1832 Chronic kidney disease, stage 3b: Secondary | ICD-10-CM | POA: Diagnosis not present

## 2025-01-12 DIAGNOSIS — D631 Anemia in chronic kidney disease: Secondary | ICD-10-CM

## 2025-01-12 NOTE — Patient Instructions (Addendum)
 [facility] Discharge Instructions   You were seen and examined today by Dr. Davonna.  She reviewed the results of your lab work which are normal/stable.   We will see you back in 6 months. We will repeat lab work at that time.    Return as scheduled.    Thank you for choosing [facility] at [facility] to provide your oncology and hematology care.  To afford each patient quality time with our provider, please arrive at least 15 minutes before your scheduled appointment time.   If you have a lab appointment with the Cancer Center please come in thru the Main Entrance and check in at the main information desk.  You need to re-schedule your appointment should you arrive 10 or more minutes late.  We strive to give you quality time with our providers, and arriving late affects you and other patients whose appointments are after yours.  Also, if you no show three or more times for appointments you may be dismissed from the clinic at the providers discretion.     Again, thank you for choosing [facility].  Our hope is that these requests will decrease the amount of time that you wait before being seen by our physicians.       _____________________________________________________________  Should you have questions after your visit to [facility], please contact our office at [phone] and follow the prompts.  Our office hours are 8:00 a.m. and 4:30 p.m. Monday - Friday.  Please note that voicemails left after 4:00 p.m. may not be returned until the following business day.  We are closed weekends and major holidays.  You do have access to a nurse 24-7, just call the main number to the clinic [phone] and do not press any options, hold on the line and a nurse will answer the phone.    For prescription refill requests, have your pharmacy contact our office and allow 72 hours.    Due to Covid, you will need to  wear a mask upon entering the hospital. If you do not have a mask, a mask will be given to you at the Main Entrance upon arrival. For doctor visits, patients may have 1 support person age 18 or older with them. For treatment visits, patients can not have anyone with them due to social distancing guidelines and our immunocompromised population.

## 2025-02-02 ENCOUNTER — Other Ambulatory Visit: Payer: Self-pay

## 2025-02-02 DIAGNOSIS — N1832 Chronic kidney disease, stage 3b: Secondary | ICD-10-CM

## 2025-02-02 MED ORDER — TRESIBA FLEXTOUCH 100 UNIT/ML ~~LOC~~ SOPN: 30.0000 [IU] | PEN_INJECTOR | Freq: Every day | SUBCUTANEOUS | 1 refills | Status: AC

## 2025-05-24 ENCOUNTER — Ambulatory Visit: Admitting: "Endocrinology

## 2025-07-06 ENCOUNTER — Inpatient Hospital Stay

## 2025-07-13 ENCOUNTER — Inpatient Hospital Stay: Admitting: Oncology
